# Patient Record
Sex: Male | Born: 1950
Health system: Southern US, Community
[De-identification: ages and names within clinical notes are randomized; demographics above are authoritative.]

## PROBLEM LIST (undated history)

## (undated) DIAGNOSIS — E119 Type 2 diabetes mellitus without complications: Secondary | ICD-10-CM

## (undated) DIAGNOSIS — Z8719 Personal history of other diseases of the digestive system: Secondary | ICD-10-CM

## (undated) DIAGNOSIS — I10 Essential (primary) hypertension: Secondary | ICD-10-CM

## (undated) DIAGNOSIS — T7840XA Allergy, unspecified, initial encounter: Secondary | ICD-10-CM

## (undated) DIAGNOSIS — Z8659 Personal history of other mental and behavioral disorders: Secondary | ICD-10-CM

## (undated) DIAGNOSIS — F32A Depression, unspecified: Secondary | ICD-10-CM

## (undated) DIAGNOSIS — N529 Male erectile dysfunction, unspecified: Secondary | ICD-10-CM

## (undated) DIAGNOSIS — K5792 Diverticulitis of intestine, part unspecified, without perforation or abscess without bleeding: Secondary | ICD-10-CM

## (undated) DIAGNOSIS — E785 Hyperlipidemia, unspecified: Secondary | ICD-10-CM

## (undated) DIAGNOSIS — F329 Major depressive disorder, single episode, unspecified: Secondary | ICD-10-CM

## (undated) DIAGNOSIS — H269 Unspecified cataract: Secondary | ICD-10-CM

## (undated) HISTORY — DX: Personal history of other mental and behavioral disorders: Z86.59

## (undated) HISTORY — DX: Allergy, unspecified, initial encounter: T78.40XA

## (undated) HISTORY — DX: Unspecified cataract: H26.9

## (undated) HISTORY — PX: NO PAST SURGERIES: SHX2092

## (undated) HISTORY — DX: Essential (primary) hypertension: I10

## (undated) HISTORY — PX: COLONOSCOPY: SHX174

## (undated) HISTORY — DX: Type 2 diabetes mellitus without complications: E11.9

## (undated) HISTORY — DX: Male erectile dysfunction, unspecified: N52.9

## (undated) HISTORY — DX: Personal history of other diseases of the digestive system: Z87.19

## (undated) HISTORY — DX: Diverticulitis of intestine, part unspecified, without perforation or abscess without bleeding: K57.92

## (undated) HISTORY — DX: Hyperlipidemia, unspecified: E78.5

## (undated) HISTORY — DX: Depression, unspecified: F32.A

## (undated) NOTE — *Deleted (*Deleted)
Chronic Care Management Pharmacy  Name: Eric Hill  MRN: [id number] DOB: Dec 05, 1950  Chief Complaint/ HPI  Eric Hill,  57 y.o., male presents for their Follow-Up CCM visit with the clinical pharmacist via telephone.  PCP : Doreene Nest, NP  Their chronic conditions include: HTN, type 2 DM, ED, vitamin D deficiency, MDD, obesity, urinary urgency   Patient concerns: reports sertraline dose is incorrect and he recently restarted cholesterol medication and vitamin D; feels awful, nausea, fatigue, not sleeping well  Update 01/13/2020 since last CCM visit:  O2 level fluctuating form 94-97% throughout the day. Still very low energy. Having stomach pain and has a history of stomach cancer in his family. He wants to see about this once he gets over his illness. Fever has resolved and feeling some better.   Office Visits: 11/08/19: PCP visit - start vitamin D 50,000 IU weekly; DM A1c 6.6 on glipizide, foot exam completed today, eye exam scheduled; cholesterol, ran out of statin, refill today; doing well on sertraline 75 mg daily, BP stable, continue lisinopril and metoprolol   Consult Visit: none in past 6 months  No Known Allergies   Medications: Outpatient Encounter Medications as of 02/13/2020  Medication Sig  . atorvastatin (LIPITOR) 20 MG tablet Take 1 tablet (20 mg total) by mouth daily. For cholesterol.  . blood glucose meter kit and supplies KIT Dispense based on patient and insurance preference. Use up to 2-3 times daily as directed. Dx is E11.9  . glipiZIDE (GLUCOTROL XL) 10 MG 24 hr tablet TAKE 1 TABLET BY MOUTH IN THE MORNING WITH FOOD FOR DIABETES  . lisinopril (ZESTRIL) 20 MG tablet Take 1 tablet (20 mg total) by mouth daily. for blood pressure  . metoprolol tartrate (LOPRESSOR) 25 MG tablet Take 1 tablet (25 mg total) by mouth 2 (two) times daily.  . psyllium (METAMUCIL) 58.6 % powder Take 1 packet by mouth as needed.  . sertraline (ZOLOFT) 50 MG tablet  Take 1.5 tablets (75 mg total) by mouth daily. For depression.  . tamsulosin (FLOMAX) 0.4 MG CAPS capsule TAKE 1 CAPSULE BY MOUTH ONCE DAILY WITH BREAKFAST FOR URINE FLOW  . Vitamin D, Ergocalciferol, (DRISDOL) 1.25 MG (50000 UNIT) CAPS capsule Take 1 capsule by mouth once weekly for vitamin D.   No facility-administered encounter medications on file as of 02/13/2020.   No Known Allergies  SDOH Screenings   Alcohol Screen:   . Last Alcohol Screening Score (AUDIT): Not on file  Depression (PHQ2-9):   . PHQ-2 Score: Not on file  Financial Resource Strain: Low Risk   . Difficulty of Paying Living Expenses: Not very hard  Food Insecurity:   . Worried About Programme researcher, broadcasting/film/video in the Last Year: Not on file  . Ran Out of Food in the Last Year: Not on file  Housing:   . Last Housing Risk Score: Not on file  Physical Activity:   . Days of Exercise per Week: Not on file  . Minutes of Exercise per Session: Not on file  Social Connections:   . Frequency of Communication with Friends and Family: Not on file  . Frequency of Social Gatherings with Friends and Family: Not on file  . Attends Religious Services: Not on file  . Active Member of Clubs or Organizations: Not on file  . Attends Banker Meetings: Not on file  . Marital Status: Not on file  Stress:   . Feeling of Stress : Not on file  Tobacco Use: Medium Risk  . Smoking Tobacco Use: Former Smoker  . Smokeless Tobacco Use: Never Used  Transportation Needs:   . Freight forwarder (Medical): Not on file  . Lack of Transportation (Non-Medical): Not on file     Current Diagnosis/Assessment:   Goals    . Pharmacy Care Plan     CARE PLAN ENTRY (see longitudinal plan of care for additional care plan information)  Current Barriers:  . Chronic Disease Management support, education, and care coordination needs related to Hypertension, Diabetes, and Urinary Urgency   Hypertension BP Readings from Last 3 Encounters:   11/08/19 132/82  10/25/19 (!) 144/77  05/05/19 131/68 .  Pharmacist Clinical Goal(s): Over the next 30 days, patient will work with PharmD and providers to achieve BP goal <140/90 mmHg . Current regimen:   Lisinopril 20 mg - 1 tablet daily  Metoprolol tartrate 25 mg - 1 tablet twice daily . Interventions: o Reviewed blood pressure log and discussed proper technique for checking blood pressure.  o Reviewed patient's medication list. . Patient self care activities - Over the next 30 days, patient will: o Recommend patient resume biking 3 times weekly now that temperatures have improved.  o Continue to check blood pressure twice daily, document, and provide at future appointment for review o Ensure daily salt intake < 2300 mg/day  Cardiovascular Risk Reduction Lab Results  Component Value Date/Time   LDLCALC 83 11/01/2018 08:51 AM   LDLDIRECT 100.0 11/03/2019 09:33 AM .  Pharmacist Clinical Goal(s): o Over the next 30 days, patient will work with PharmD and providers to improve medication side effects and reduce cardiovascular risk  . Current regimen:  o Atorvastatin 20 mg - 1 tablet daily . Interventions: o Recommend discontinuing atorvastatin due to intolerance (fatigue, nausea). Restore vitamin D levels and then consider starting pravastatin to reduce cardiovascular risk. . Patient self care activities - Over the next 30 days, patient will: o Discontinue atorvastatin  o Continue vitamin D once weekly   Diabetes Lab Results  Component Value Date/Time   HGBA1C 6.6 (H) 11/03/2019 09:33 AM   HGBA1C 6.2 11/01/2018 08:51 AM  Fasting home blood glucose readings: 160s before breakfast, 118-135 before lunch and supper . Pharmacist Clinical Goal(s): o Over the next 30 days, patient will work with PharmD and providers to maintain A1c goal <7% and ensure no hypoglycemia (BG < 70 mg/dL) . Current regimen:   Glipizide 10 mg ER - 1 tablet daily before first meal of the day  Interventions: o Recommend eating a high protein and fiber snack before bedtime to prevent overnight hypoglycemia, such as yogurt with no sugar added or cheese and nuts o Reviewed blood sugar readings. Discussed elevated blood sugars recently. Will follow-up in 2 weeks to determine if improving with diet/exercise.  . Patient self care activities - Over the next 30 days, patient will: o Check blood sugar daily first thing in the morning before eating or drinking, document, and provide at future appointment o Contact provider with any episodes of hypoglycemia o Review labels on snacks and choose options with 15-20 grams of carbs with high protein and fiber  Urinary Urgency . Pharmacist Clinical Goal(s) o Over the next 30 days, patient will work with PharmD and providers to improve urinary symptoms including weak stream . Current regimen:  o Tamsulosin 0.4 mg - 1 capsule daily with breakfast . Interventions: o Discussed patient reports 2 capsule daily dose did not improve symptoms and he didn't feel well so he  resumed daily.  . Patient self care activities - Over the next 30 days, patient will: o Continue tamsulosin 0.4 mg daily o Monitor for symptoms improvement over the next 2-3 weeks  Medication management . Pharmacist Clinical Goal(s): o Over the next 30 days, patient will work with PharmD and providers to achieve optimal medication adherence . Current pharmacy: [facility] . Interventions o Comprehensive medication review performed. o Utilize [facility] for medication synchronization, packaging and delivery . Patient self care activities - Over the next 30 days, patient will: o Coordinate with pharmacist for to have medications synchronized o Call [place] with any questions or concerns o Continue to take medications as prescribed  Please see past updates related to this goal by clicking on the "Past Updates" button in the selected goal         Hypertension   CMP  Latest Ref Rng & Units 11/03/2019 11/01/2018 05/03/2018  Glucose 70 - 99 mg/dL 161(W) 960(A) 540(J)  BUN 6 - 23 mg/dL 20 17 22   Creatinine 0.40 - 1.50 mg/dL 8.11 9.14 7.82  Sodium 135 - 145 mEq/L 136 138 136  Potassium 3.5 - 5.1 mEq/L 4.4 4.5 4.3  Chloride 96 - 112 mEq/L 100 103 102  CO2 19 - 32 mEq/L 28 28 27   Calcium 8.4 - 10.5 mg/dL 9.1 8.7 9.3  Total Protein 6.0 - 8.3 g/dL 6.9 6.8 -  Total Bilirubin 0.2 - 1.2 mg/dL 0.6 0.5 -  Alkaline Phos 39 - 117 U/L 77 77 -  AST 0 - 37 U/L 19 14 -  ALT 0 - 53 U/L 19 14 -   Office blood pressures are: BP Readings from Last 3 Encounters:  11/08/19 132/82  10/25/19 (!) 144/77  05/05/19 131/68   Patient has failed these meds in the past: none reported Patient checks BP at home: 2x per day, [employer] Patient home BP readings are ranging: 160/73, 68 (yesterday - he is currently out of lisinopril and metoprolol for the past 2 days); reports usually 145-160/70-80s in the morning before medications, 125/70-80 in the evenings  BP goal < 140/90 mmHg Patient is currently controlled on the following medications:   Lisinopril 20 mg - 1 tablet daily  Metoprolol tartrate 25 mg - 1 tablet BID (pt taking differently: 1 tablet in AM, 1/2 at night)   Update 01/13/2020 - Patient purchased a bike trainer this week to be able to exercise  and has been getting out of the house working in his shop. Blood pressure this week has been: ~127-135/70-80 pulse ~70.  .   Plan: Continue current medications;    Diabetes   Kidney Function Lab Results  Component Value Date/Time   CREATININE 0.88 11/03/2019 09:33 AM   CREATININE 0.81 11/01/2018 08:51 AM   GFR 86.48 11/03/2019 09:33 AM   K 4.4 11/03/2019 09:33 AM   K 4.5 11/01/2018 08:51 AM   Recent Relevant Labs: Lab Results  Component Value Date/Time   HGBA1C 6.6 (H) 11/03/2019 09:33 AM   HGBA1C 6.2 11/01/2018 08:51 AM   MICROALBUR 1.0 12/18/2015 03:23 PM   MICROALBUR 0.8 09/13/2014 09:01 AM   Checking  BG: 2x per Day  Reports BG before breakfast: 160s (not checking daily) Reports BG before lunch and before supper: 118-135 (checks daily) Hypoglycemia: reports feeling like he is going to pass out when his BG is < 90, yesterday BG was 85 at 5 PM and felt bad, ate a chocolate candy bar; denies doing any activity yesterday, slept a  lot, didn't eat as much as usual; prior to yesterday it had a been a year since BG < 90  A1c goal < 7% Patient has failed these meds in past: metformin (diarrhea) Patient is currently uncontrolled on the following medications:   Glipizide 10 mg ER - 1 tablet daily with food  We discussed:  Update 01/13/2020 - Blood sugar is still elevated at times at home. Blood sugar was 252 mg/dL. Patient does not feel that Glipizide ER 10 mg daily is enough for his blood sugar. He has been self-adjusting to take a second 1/2 tablet on days his blood sugar is high. His blood sugar is not running low lately.  Morning fasting blood sugar in the last  few weeks has been ~156, 164, 209, 160, 134, 166, 132, 134, 148, 226, 238, 246, 108, 143, 256 mg/dL. Before supper: 139 mg/dL. If healthier eating and exercise does not make a change in his blood sugars, he would like to consider increasing glipizide dose.   Diet - Patient indicates he is not watching carbohydrate his intake.   Exercise - Patient plans to start riding his bicycle 3 times each week.   Plan: Continue current medications.    Medication Management   OTCs: denies vitamins or OTC medications except Metamucil, not taking aspirin; reports taking Metamucil most days to prevent diarrhea, drinks plenty of water [facility] Adherence: Patient reports frustration with running out of medications, hassle of getting medications filled; he is out of multiple medications because insurance too soon to refill Cost: denies concerns  We discussed: Verbal consent obtained for [facility] enhanced pharmacy services  (medication synchronization, adherence packaging, delivery coordination). A medication sync plan was created to allow patient to get all medications delivered once every 30 to 90 days per patient preference. Patient understands they have freedom to choose pharmacy and clinical pharmacist will coordinate care between all prescribers and [facility].  Plan: Utilize [facility] for medication coordination, sync and delivery services.   CCM Follow Up: 2 weeks on blood sugar log to determine if medication needs to increased  Juliane Lack, PharmD, [facility] Clinical Pharmacist [facility] [phone] (office) [phone] (mobile)

---
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
## 1998-04-30 ENCOUNTER — Ambulatory Visit (HOSPITAL_COMMUNITY): Admission: RE | Admit: 1998-04-30 | Discharge: 1998-04-30 | Payer: Self-pay | Admitting: Gastroenterology

## 2014-08-10 ENCOUNTER — Encounter: Payer: Self-pay | Admitting: Primary Care

## 2014-08-10 ENCOUNTER — Ambulatory Visit (INDEPENDENT_AMBULATORY_CARE_PROVIDER_SITE_OTHER): Payer: BLUE CROSS/BLUE SHIELD | Admitting: Primary Care

## 2014-08-10 VITALS — BP 146/74 | HR 92 | Temp 98.1°F | Ht 65.75 in | Wt 264.0 lb

## 2014-08-10 DIAGNOSIS — R030 Elevated blood-pressure reading, without diagnosis of hypertension: Secondary | ICD-10-CM | POA: Diagnosis not present

## 2014-08-10 DIAGNOSIS — N529 Male erectile dysfunction, unspecified: Secondary | ICD-10-CM

## 2014-08-10 DIAGNOSIS — M25569 Pain in unspecified knee: Secondary | ICD-10-CM

## 2014-08-10 DIAGNOSIS — G8929 Other chronic pain: Secondary | ICD-10-CM

## 2014-08-10 DIAGNOSIS — Q828 Other specified congenital malformations of skin: Secondary | ICD-10-CM | POA: Diagnosis not present

## 2014-08-10 DIAGNOSIS — M25561 Pain in right knee: Secondary | ICD-10-CM

## 2014-08-10 DIAGNOSIS — I10 Essential (primary) hypertension: Secondary | ICD-10-CM | POA: Insufficient documentation

## 2014-08-10 DIAGNOSIS — G4720 Circadian rhythm sleep disorder, unspecified type: Secondary | ICD-10-CM

## 2014-08-10 DIAGNOSIS — G479 Sleep disorder, unspecified: Secondary | ICD-10-CM | POA: Diagnosis not present

## 2014-08-10 DIAGNOSIS — IMO0001 Reserved for inherently not codable concepts without codable children: Secondary | ICD-10-CM

## 2014-08-10 MED ORDER — SILDENAFIL CITRATE 25 MG PO TABS: ORAL_TABLET | ORAL | Status: DC

## 2014-08-10 NOTE — Assessment & Plan Note (Signed)
Difficulty staying asleep. Denies anxiety symptoms. Suspect this may be OSA, referral made for sleep study.

## 2014-08-10 NOTE — Assessment & Plan Note (Signed)
Likely due to obesity, encouraged weight loss through healthy diet and exercise. RX for Viagra. Explained side effects and provided directions for use including not to exceed 1 tablet per day. Denies chest pain, shortness of breath.

## 2014-08-10 NOTE — Assessment & Plan Note (Signed)
Since accident in 2008. Takes ibuprofen as needed (every 3 days or so). Reports he's had multiple xrays in the past.

## 2014-08-10 NOTE — Assessment & Plan Note (Signed)
Thoroughly discussed his current diet and lack of exercise. Recommendations made to limit carbohydrates such as bread, white rice, pastas, sugary foods and drinks; also incorporate fresh fruits and vegetables, lean meats, and increase water consumption.

## 2014-08-10 NOTE — Assessment & Plan Note (Signed)
Numerous present to bilateral base of neck. Reports they are present to bilateral groin as well, and would like them removed. Also reports new mole to left side of face to TMJ. Referral made to Dermatology.

## 2014-08-10 NOTE — Progress Notes (Signed)
Subjective:    Patient ID: Eric Hill, male    DOB: 09/21/1950, 64 y.o.   MRN: [id number]  HPI  Mr. Eric Hill is a 64 year old male who presents today to establish care and discuss the problems mentioned below. Will obtain old records.  1) Depression: Diagnosed years 20+ years ago while he was recovering from alcoholism. Denies SI/HI and denies depressive symptoms.   2) Erectile dysfunction: Experiencing difficulty maintaining an erection. He's not sure how long he's been experiencing this, but he's been in a relationship for the past 4 months and has noticed his difficulty since. He is consistently having difficulty maintaining his erection and will quickly lose his erection during each time he engages in intercourse. Denies chest pain, shortness of breath.  Body mass index is 42.94 kg/(m^2).  3) Elevated blood pressure: He's never been formally diagnosed. Today's reading is elevated. Denies headaches or changes in vision.  BP Readings from Last 3 Encounters:  08/10/14 146/74   4) Borderline diabetes: Was told by prior PCP 2 years ago. He's cut back on his candy bars and caffeine. His diet consists of vegetables and meat as he frequently eats at [employer]. He's also down down on his fast food consumption. If he cooks he will typically broil meat at home. Denies alcohol consumption. Drinks mostly water throughout a day, will drink a 20oz of soda per day. Does not exercise.  5) Sleep Disturbance: No trouble falling asleep, but will wake up in the middle of the night and be wide away throughout the night. Denies palpitations. Denies anxiety symptoms.   6) Knee pain: Injured in 2008 at work. Takes Advil every few days. Pain is worse when sitting and improved when walking. He reports he's had multiple xrays in the past. When he experiences his sharp pain it's at a 6/10.   Review of Systems  Constitutional: Negative for fatigue and unexpected weight change.  HENT: Negative for  rhinorrhea.   Respiratory: Negative for cough and shortness of breath.   Cardiovascular: Negative for chest pain.  Gastrointestinal: Negative for diarrhea and constipation.  Genitourinary: Negative for dysuria, difficulty urinating and penile pain.       Erectile difficulty  Musculoskeletal:       Right knee pain  Allergic/Immunologic:       Occasional seasonal allergies  Neurological: Negative for numbness and headaches.  Hematological: Negative for adenopathy.  Psychiatric/Behavioral:       Denies concerns for anxiety or depression       Past Medical History  Diagnosis Date  . Borderline diabetes   . History of depression   . Erectile dysfunction     History   Social History  . Marital Status: Single    Spouse Name: N/A  . Number of Children: N/A  . Years of Education: N/A   Occupational History  . Not on file.   Social History Main Topics  . Smoking status: Never Smoker   . Smokeless tobacco: Not on file  . Alcohol Use: No  . Drug Use: No  . Sexual Activity: Not on file   Other Topics Concern  . Not on file   Social History Narrative   Single   Works as a Mudlogger.   No children   Lives in [place]   Enjoys racing cars, Designer, fashion/clothing.       No past surgical history on file.  Family History  Problem Relation Age of Onset  . Neuropathy Mother   .  Cancer Sister     Back, brain  . Cancer Maternal Grandfather     Abdominal     No Known Allergies  No current outpatient prescriptions on file prior to visit.   No current facility-administered medications on file prior to visit.    BP 146/74 mmHg  Pulse 92  Temp(Src) 98.1 F (36.7 C) (Oral)  Ht 5' 5.75" (1.67 m)  Wt 264 lb (119.75 kg)  BMI 42.94 kg/m2  SpO2 95%    Objective:   Physical Exam  Constitutional: He is oriented to person, place, and time. He appears well-developed.  HENT:  Head: Normocephalic.  Right Ear: External ear normal.  Left Ear: External ear normal.    Nose: Nose normal.  Mouth/Throat: Oropharynx is clear and moist.  Eyes: EOM are normal. Pupils are equal, round, and reactive to light.  Neck: Neck supple.  Cardiovascular: Normal rate and regular rhythm.   Pulmonary/Chest: Effort normal and breath sounds normal.  Abdominal: Soft. Bowel sounds are normal. There is no tenderness.  Lymphadenopathy:    He has no cervical adenopathy.  Neurological: He is alert and oriented to person, place, and time.  Skin: Skin is warm and dry.  Psychiatric: He has a normal mood and affect.          Assessment & Plan:

## 2014-08-10 NOTE — Patient Instructions (Signed)
Please schedule a physical with me in the next month. You will also schedule a lab appointment prior to your physical. Come to your lab appointment fasting for at least 4 hours. You may try the Viagra tablets for erectile dysfunction. Take one tablet by mouth 30 minutes to 1 hour prior to intercourse. Do not exceed 1 tablet in one day. You will be contacted regarding your sleep study. It was a pleasure to meet you today! Please don't hesitate to call me with any questions. Welcome to [facility]!  Erectile Dysfunction Erectile dysfunction is the inability to get or sustain a good enough erection to have sexual intercourse. Erectile dysfunction may involve:  Inability to get an erection.  Lack of enough hardness to allow penetration.  Loss of the erection before sex is finished.  Premature ejaculation. CAUSES  Certain drugs, such as:  Pain relievers.  Antihistamines.  Antidepressants.  Blood pressure medicines.  Water pills (diuretics).  Ulcer medicines.  Muscle relaxants.  Illegal drugs.  Excessive drinking.  Psychological causes, such as:  Anxiety.  Depression.  Sadness.  Exhaustion.  Performance fear.  Stress.  Physical causes, such as:  Artery problems. This may include diabetes, smoking, liver disease, or atherosclerosis.  High blood pressure.  Hormonal problems, such as low testosterone.  Obesity.  Nerve problems. This may include back or pelvic injuries, diabetes mellitus, multiple sclerosis, or Parkinson disease. SYMPTOMS  Inability to get an erection.  Lack of enough hardness to allow penetration.  Loss of the erection before sex is finished.  Premature ejaculation.  Normal erections at some times, but with frequent unsatisfactory episodes.  Orgasms that are not satisfactory in sensation or frequency.  Low sexual satisfaction in either partner because of erection problems.  A curved penis occurring with erection. The curve may cause  pain or may be too curved to allow for intercourse.  Never having nighttime erections. DIAGNOSIS Your caregiver can often diagnose this condition by:  Performing a physical exam to find other diseases or specific problems with the penis.  Asking you detailed questions about the problem.  Performing blood tests to check for diabetes mellitus or to measure hormone levels.  Performing urine tests to find other underlying health conditions.  Performing an ultrasound exam to check for scarring.  Performing a test to check blood flow to the penis.  Doing a sleep study at home to measure nighttime erections. TREATMENT   You may be prescribed medicines by mouth.  You may be given medicine injections into the penis.  You may be prescribed a vacuum pump with a ring.  Penile implant surgery may be performed. You may receive:  An inflatable implant.  A semirigid implant.  Blood vessel surgery may be performed. HOME CARE INSTRUCTIONS  If you are prescribed oral medicine, you should take the medicine as prescribed. Do not increase the dosage without first discussing it with your physician.  If you are using self-injections, be careful to avoid any veins that are on the surface of the penis. Apply pressure to the injection site for 5 minutes.  If you are using a vacuum pump, make sure you have read the instructions before using it. Discuss any questions with your physician before taking the pump home. SEEK MEDICAL CARE IF:  You experience pain that is not responsive to the pain medicine you have been prescribed.  You experience nausea or vomiting. SEEK IMMEDIATE MEDICAL CARE IF:   When taking oral or injectable medications, you experience an erection that lasts longer  than 4 hours. If your physician is unavailable, go to the nearest emergency room for evaluation. An erection that lasts much longer than 4 hours can result in permanent damage to your penis.  You have pain that is  severe.  You develop redness, severe pain, or severe swelling of your penis.  You have redness spreading up into your groin or lower abdomen.  You are unable to pass your urine. Document Released: 04/11/2000 Document Revised: 12/15/2012 Document Reviewed: 09/16/2012 [facility] Patient Information 2015 [place], [place]. This information is not intended to replace advice given to you by your health care provider. Make sure you discuss any questions you have with your health care provider.

## 2014-08-10 NOTE — Assessment & Plan Note (Signed)
Elevated in clinic today. Will re-evaluate next visit, and if elevated will start medication therapy. Explained importance of limiting salt and eating healthy diet.

## 2014-08-10 NOTE — Progress Notes (Signed)
Pre visit review using our clinic review tool, if applicable. No additional management support is needed unless otherwise documented below in the visit note. 

## 2014-08-31 ENCOUNTER — Encounter: Payer: Self-pay | Admitting: Primary Care

## 2014-08-31 ENCOUNTER — Ambulatory Visit (INDEPENDENT_AMBULATORY_CARE_PROVIDER_SITE_OTHER): Payer: BLUE CROSS/BLUE SHIELD | Admitting: Primary Care

## 2014-08-31 VITALS — BP 146/72 | HR 83 | Temp 97.7°F | Ht 65.75 in | Wt 264.8 lb

## 2014-08-31 DIAGNOSIS — G479 Sleep disorder, unspecified: Secondary | ICD-10-CM | POA: Diagnosis not present

## 2014-08-31 DIAGNOSIS — G4700 Insomnia, unspecified: Secondary | ICD-10-CM | POA: Diagnosis not present

## 2014-08-31 DIAGNOSIS — G4720 Circadian rhythm sleep disorder, unspecified type: Secondary | ICD-10-CM

## 2014-08-31 MED ORDER — TRAZODONE HCL 50 MG PO TABS
25.0000 mg | ORAL_TABLET | Freq: Every evening | ORAL | Status: DC | PRN
Start: 2014-08-31 — End: 2015-08-29

## 2014-08-31 NOTE — Assessment & Plan Note (Signed)
Chronic for 4-5 years, worsened over the past 3 months. Tried Melatonin, will only sleep for one hour. Screened for anxiety and depression, does not meet criteria. Sleep study scheduled in 10/10/14 RX for Trazodone 50. Start with 25 mg at bedtime. May take 50 if needed. Will follow up in two weeks at physical

## 2014-08-31 NOTE — Assessment & Plan Note (Signed)
Discussed importance of weight loss for multiple reasons, including sleep and decreased libido. Education provided including handout.  Will follow up in 2 weeks at physical.

## 2014-08-31 NOTE — Patient Instructions (Signed)
Start Trazodone to help with sleep. Take 1/2 to 1 tablet by mouth at bedtime as needed for sleep.  It is important that you improve your diet. Please limit carbohydrates in the form of white bread, rice, pasta, potatoes, macaroni and cheese, cakes, cookies, sugary drinks, etc. Increase your consumption of fresh fruits and vegetables. Be sure to drink plenty of water daily. No more sweet tea.  Try to start exercising at least 5 days per week for 30-45 min.   Come May 16th for a lab appointment (come fasting) and then May 17th for your physical.  It was nice seeing you again.

## 2014-08-31 NOTE — Progress Notes (Signed)
Subjective:    Patient ID: Eric Hill, male    DOB: [birth year], 64 y.o.   MRN: [id number]  HPI  Mr. Cruces is a 64 year old male who presents today with a chief complaint of difficulty sleeping. He's had trouble sleeping since for the past 4-5 years, mostly in the winter. It's really worsened over the past 3 months since he started dating his girlfriend. He has a sleep study scheduled for June 14th. He's tried taking 3 different brands of Melatonin without help. He will feel drowsy and has no trouble falling asleep, but will wake back up after an hour or two. He has most difficulty staying asleep. He sleeps one hour at night. Does not drink caffeine. Bedtime routine includes resting for an hour.   Denies worrying, does not feel irritable during the day, does feel fatigued/tired during the day and is without energy, no muscle tension, does not feel depressed. PHQ-9 score of 0 in the office today.    Review of Systems  Constitutional: Positive for fatigue.  Respiratory: Negative for shortness of breath.   Cardiovascular: Negative for chest pain.  Neurological: Negative for headaches.  Psychiatric/Behavioral: Positive for sleep disturbance. Negative for decreased concentration and agitation. The patient is not nervous/anxious.        Past Medical History  Diagnosis Date  . Borderline diabetes   . History of depression   . Erectile dysfunction     History   Social History  . Marital Status: Single    Spouse Name: N/A  . Number of Children: N/A  . Years of Education: N/A   Occupational History  . Not on file.   Social History Main Topics  . Smoking status: Never Smoker   . Smokeless tobacco: Not on file  . Alcohol Use: No  . Drug Use: No  . Sexual Activity: Not on file   Other Topics Concern  . Not on file   Social History Narrative   Single   Works as a Mudlogger.   No children   Lives in [place]   Enjoys racing cars, Designer, fashion/clothing.        History reviewed. No pertinent past surgical history.  Family History  Problem Relation Age of Onset  . Neuropathy Mother   . Cancer Sister     Back, brain  . Cancer Maternal Grandfather     Abdominal     No Known Allergies  Current Outpatient Prescriptions on File Prior to Visit  Medication Sig Dispense Refill  . sildenafil (VIAGRA) 25 MG tablet Take one tablet 30 minutes to 1 hour prior to intercourse. Do not exceed one tablet per day. 10 tablet 0   No current facility-administered medications on file prior to visit.    BP 146/72 mmHg  Pulse 83  Temp(Src) 97.7 F (36.5 C) (Oral)  Ht 5' 5.75" (1.67 m)  Wt 264 lb 12.8 oz (120.112 kg)  BMI 43.07 kg/m2  SpO2 94%    Objective:   Physical Exam  Constitutional: He is oriented to person, place, and time.  Appears tired and is frustrated with his inability to sleep.  Eyes: EOM are normal. Pupils are equal, round, and reactive to light.  Cardiovascular: Normal rate and regular rhythm.   Pulmonary/Chest: Effort normal and breath sounds normal.  Neurological: He is alert and oriented to person, place, and time. No cranial nerve deficit.  Skin: Skin is warm and dry.  Psychiatric: He has a normal mood and affect.  Assessment & Plan:

## 2014-08-31 NOTE — Assessment & Plan Note (Deleted)
Chronic for 4-5 years, worsened over the past 3 months. Tried Melatonin, will only sleep for one hour. Screened for anxiety and depression, does not meet criteria. Sleep study scheduled in 10/10/14 RX for Trazodone 50. Start with 25 mg at bedtime. May take 50 if needed. Will follow up in two weeks at physical

## 2014-09-04 ENCOUNTER — Ambulatory Visit: Payer: Self-pay | Admitting: Primary Care

## 2014-09-06 ENCOUNTER — Other Ambulatory Visit: Payer: Self-pay | Admitting: Primary Care

## 2014-09-06 DIAGNOSIS — E669 Obesity, unspecified: Secondary | ICD-10-CM

## 2014-09-06 DIAGNOSIS — Z0000 Encounter for general adult medical examination without abnormal findings: Secondary | ICD-10-CM

## 2014-09-06 DIAGNOSIS — Z8639 Personal history of other endocrine, nutritional and metabolic disease: Secondary | ICD-10-CM

## 2014-09-11 ENCOUNTER — Other Ambulatory Visit (INDEPENDENT_AMBULATORY_CARE_PROVIDER_SITE_OTHER): Payer: BLUE CROSS/BLUE SHIELD

## 2014-09-11 DIAGNOSIS — E669 Obesity, unspecified: Secondary | ICD-10-CM | POA: Diagnosis not present

## 2014-09-11 DIAGNOSIS — Z0000 Encounter for general adult medical examination without abnormal findings: Secondary | ICD-10-CM | POA: Diagnosis not present

## 2014-09-11 DIAGNOSIS — Z8639 Personal history of other endocrine, nutritional and metabolic disease: Secondary | ICD-10-CM

## 2014-09-11 LAB — CBC WITH DIFFERENTIAL/PLATELET
BASOS PCT: 0.5 % (ref 0.0–3.0)
Basophils Absolute: 0 10*3/uL (ref 0.0–0.1)
Eosinophils Absolute: 0.2 10*3/uL (ref 0.0–0.7)
Eosinophils Relative: 2.9 % (ref 0.0–5.0)
HEMATOCRIT: 45.4 % (ref 39.0–52.0)
Hemoglobin: 15.5 g/dL (ref 13.0–17.0)
LYMPHS ABS: 1.6 10*3/uL (ref 0.7–4.0)
Lymphocytes Relative: 22.4 % (ref 12.0–46.0)
MCHC: 34.1 g/dL (ref 30.0–36.0)
MCV: 88 fl (ref 78.0–100.0)
MONO ABS: 0.6 10*3/uL (ref 0.1–1.0)
Monocytes Relative: 8.7 % (ref 3.0–12.0)
Neutro Abs: 4.7 10*3/uL (ref 1.4–7.7)
Neutrophils Relative %: 65.5 % (ref 43.0–77.0)
PLATELETS: 262 10*3/uL (ref 150.0–400.0)
RBC: 5.16 Mil/uL (ref 4.22–5.81)
RDW: 13.5 % (ref 11.5–15.5)
WBC: 7.2 10*3/uL (ref 4.0–10.5)

## 2014-09-11 LAB — VITAMIN D 25 HYDROXY (VIT D DEFICIENCY, FRACTURES): VITD: 10.38 ng/mL — ABNORMAL LOW (ref 30.00–100.00)

## 2014-09-11 LAB — COMPREHENSIVE METABOLIC PANEL
ALBUMIN: 3.7 g/dL (ref 3.5–5.2)
ALK PHOS: 63 U/L (ref 39–117)
ALT: 18 U/L (ref 0–53)
AST: 19 U/L (ref 0–37)
BUN: 21 mg/dL (ref 6–23)
CALCIUM: 9.2 mg/dL (ref 8.4–10.5)
CHLORIDE: 101 meq/L (ref 96–112)
CO2: 31 meq/L (ref 19–32)
Creatinine, Ser: 0.77 mg/dL (ref 0.40–1.50)
GFR: 108.99 mL/min (ref >60.00)
GLUCOSE: 148 mg/dL — AB (ref 70–99)
POTASSIUM: 4.3 meq/L (ref 3.5–5.1)
SODIUM: 136 meq/L (ref 135–145)
TOTAL PROTEIN: 7.1 g/dL (ref 6.0–8.3)
Total Bilirubin: 0.9 mg/dL (ref 0.2–1.2)

## 2014-09-11 LAB — LIPID PANEL
Cholesterol: 150 mg/dL (ref 0–200)
HDL: 46.3 mg/dL (ref >39.00)
LDL Cholesterol: 86 mg/dL (ref 0–99)
NonHDL: 103.7
Total CHOL/HDL Ratio: 3
Triglycerides: 89 mg/dL (ref 0.0–149.0)
VLDL: 17.8 mg/dL (ref 0.0–40.0)

## 2014-09-11 LAB — HEMOGLOBIN A1C: Hgb A1c MFr Bld: 7.3 % — ABNORMAL HIGH (ref 4.6–6.5)

## 2014-09-11 LAB — TSH: TSH: 2.35 u[IU]/mL (ref 0.35–4.50)

## 2014-09-12 ENCOUNTER — Ambulatory Visit (INDEPENDENT_AMBULATORY_CARE_PROVIDER_SITE_OTHER): Payer: BLUE CROSS/BLUE SHIELD | Admitting: Primary Care

## 2014-09-12 ENCOUNTER — Telehealth: Payer: Self-pay | Admitting: Primary Care

## 2014-09-12 ENCOUNTER — Encounter: Payer: Self-pay | Admitting: Primary Care

## 2014-09-12 VITALS — BP 148/70 | HR 90 | Temp 98.3°F | Ht 65.75 in | Wt 265.4 lb

## 2014-09-12 DIAGNOSIS — Z1211 Encounter for screening for malignant neoplasm of colon: Secondary | ICD-10-CM

## 2014-09-12 DIAGNOSIS — E559 Vitamin D deficiency, unspecified: Secondary | ICD-10-CM | POA: Diagnosis not present

## 2014-09-12 DIAGNOSIS — Z0000 Encounter for general adult medical examination without abnormal findings: Secondary | ICD-10-CM | POA: Diagnosis not present

## 2014-09-12 DIAGNOSIS — E119 Type 2 diabetes mellitus without complications: Secondary | ICD-10-CM | POA: Diagnosis not present

## 2014-09-12 DIAGNOSIS — Z0001 Encounter for general adult medical examination with abnormal findings: Secondary | ICD-10-CM | POA: Insufficient documentation

## 2014-09-12 DIAGNOSIS — G479 Sleep disorder, unspecified: Secondary | ICD-10-CM

## 2014-09-12 DIAGNOSIS — E1165 Type 2 diabetes mellitus with hyperglycemia: Secondary | ICD-10-CM | POA: Insufficient documentation

## 2014-09-12 DIAGNOSIS — G4720 Circadian rhythm sleep disorder, unspecified type: Secondary | ICD-10-CM

## 2014-09-12 MED ORDER — METFORMIN HCL 500 MG PO TABS
500.0000 mg | ORAL_TABLET | Freq: Two times a day (BID) | ORAL | Status: DC
Start: 2014-09-12 — End: 2015-08-30

## 2014-09-12 MED ORDER — VITAMIN D (ERGOCALCIFEROL) 1.25 MG (50000 UNIT) PO CAPS: ORAL_CAPSULE | ORAL | Status: DC

## 2014-09-12 NOTE — Assessment & Plan Note (Signed)
Hemoglobin A1C of 7.3. Discussed the importance of healthy diet and exercise. Started Metformin 500 mg BID. Start with 500 mg daily for 3 days then advance to BID. Urine micro albumin today. Foot exam today. Follow up in 3 months.

## 2014-09-12 NOTE — Assessment & Plan Note (Signed)
Tetanus up to date. Declines pneumovax. Never has received influenza and is not interested next season. Discussed diabetic diet and the importance of exercise.  Referral made for colonoscopy. Eye exam up to date.

## 2014-09-12 NOTE — Assessment & Plan Note (Signed)
Vitamin D of 10. Start Vitamin D 50,000 units weekly for 12 weeks. Will recheck in 12 weeks.

## 2014-09-12 NOTE — Patient Instructions (Addendum)
Start Metformin tablets for diabetes. Start taking 1 tablet by mouth daily for 3 days, then advance to take 1 tablet by mouth twice daily. Start Vitamin D capsules. Take 1 capsule by mouth once weekly for a total of 12 weeks. You will be contacted regarding your referral to GI for your colonoscopy.  Please let us know if you have not heard back within one week.  It is important that you improve your diet. Please limit carbohydrates in the form of white bread, rice, pasta, cakes, cookies, sugary drinks, etc. Increase your consumption of fresh fruits and vegetables. Be sure to drink plenty of water daily. Follow up in 3 months for re-check of your diabetes.  Type 2 Diabetes Mellitus Type 2 diabetes mellitus, often simply referred to as type 2 diabetes, is a long-lasting (chronic) disease. In type 2 diabetes, the pancreas does not make enough insulin (a hormone), the cells are less responsive to the insulin that is made (insulin resistance), or both. Normally, insulin moves sugars from food into the tissue cells. The tissue cells use the sugars for energy. The lack of insulin or the lack of normal response to insulin causes excess sugars to build up in the blood instead of going into the tissue cells. As a result, high blood sugar (hyperglycemia) develops. The effect of high sugar (glucose) levels can cause many complications. Type 2 diabetes was also previously called adult-onset diabetes, but it can occur at any age.  RISK FACTORS  A person is predisposed to developing type 2 diabetes if someone in the family has the disease and also has one or more of the following primary risk factors:  Overweight.  An inactive lifestyle.  A history of consistently eating high-calorie foods. Maintaining a normal weight and regular physical activity can reduce the chance of developing type 2 diabetes. SYMPTOMS  A person with type 2 diabetes may not show symptoms initially. The symptoms of type 2 diabetes appear  slowly. The symptoms include:  Increased thirst (polydipsia).  Increased urination (polyuria).  Increased urination during the night (nocturia).  Weight loss. This weight loss may be rapid.  Frequent, recurring infections.  Tiredness (fatigue).  Weakness.  Vision changes, such as blurred vision.  Fruity smell to your breath.  Abdominal pain.  Nausea or vomiting.  Cuts or bruises which are slow to heal.  Tingling or numbness in the hands or feet. DIAGNOSIS Type 2 diabetes is frequently not diagnosed until complications of diabetes are present. Type 2 diabetes is diagnosed when symptoms or complications are present and when blood glucose levels are increased. Your blood glucose level may be checked by one or more of the following blood tests:  A fasting blood glucose test. You will not be allowed to eat for at least 8 hours before a blood sample is taken.  A random blood glucose test. Your blood glucose is checked at any time of the day regardless of when you ate.  A hemoglobin A1c blood glucose test. A hemoglobin A1c test provides information about blood glucose control over the previous 3 months.  An oral glucose tolerance test (OGTT). Your blood glucose is measured after you have not eaten (fasted) for 2 hours and then after you drink a glucose-containing beverage. TREATMENT   You may need to take insulin or diabetes medicine daily to keep blood glucose levels in the desired range.  If you use insulin, you may need to adjust the dosage depending on the carbohydrates that you eat with each meal or  snack. The treatment goal is to maintain the before meal blood sugar (preprandial glucose) level at 70-130 mg/dL. HOME CARE INSTRUCTIONS   Have your hemoglobin A1c level checked twice a year.  Perform daily blood glucose monitoring as directed by your health care provider.  Monitor urine ketones when you are ill and as directed by your health care provider.  Take your  diabetes medicine or insulin as directed by your health care provider to maintain your blood glucose levels in the desired range.  Never run out of diabetes medicine or insulin. It is needed every day.  If you are using insulin, you may need to adjust the amount of insulin given based on your intake of carbohydrates. Carbohydrates can raise blood glucose levels but need to be included in your diet. Carbohydrates provide vitamins, minerals, and fiber which are an essential part of a healthy diet. Carbohydrates are found in fruits, vegetables, whole grains, dairy products, legumes, and foods containing added sugars.  Eat healthy foods. You should make an appointment to see a registered dietitian to help you create an eating plan that is right for you.  Lose weight if you are overweight.  Carry a medical alert card or wear your medical alert jewelry.  Carry a 15-gram carbohydrate snack with you at all times to treat low blood glucose (hypoglycemia). Some examples of 15-gram carbohydrate snacks include:  Glucose tablets, 3 or 4.  Glucose gel, 15-gram tube.  Raisins, 2 tablespoons (24 grams).  Jelly beans, 6.  Animal crackers, 8.  Regular pop, 4 ounces (120 mL).  Gummy treats, 9.  Recognize hypoglycemia. Hypoglycemia occurs with blood glucose levels of 70 mg/dL and below. The risk for hypoglycemia increases when fasting or skipping meals, during or after intense exercise, and during sleep. Hypoglycemia symptoms can include:  Tremors or shakes.  Decreased ability to concentrate.  Sweating.  Increased heart rate.  Headache.  Dry mouth.  Hunger.  Irritability.  Anxiety.  Restless sleep.  Altered speech or coordination.  Confusion.  Treat hypoglycemia promptly. If you are alert and able to safely swallow, follow the 15:15 rule:  Take 15-20 grams of rapid-acting glucose or carbohydrate. Rapid-acting options include glucose gel, glucose tablets, or 4 ounces (120 mL) of  fruit juice, regular soda, or low-fat milk.  Check your blood glucose level 15 minutes after taking the glucose.  Take 15-20 grams more of glucose if the repeat blood glucose level is still 70 mg/dL or below.  Eat a meal or snack within 1 hour once blood glucose levels return to normal.  Be alert to feeling very thirsty and urinating more frequently than usual, which are early signs of hyperglycemia. An early awareness of hyperglycemia allows for prompt treatment. Treat hyperglycemia as directed by your health care provider.  Engage in at least 150 minutes of moderate-intensity physical activity a week, spread over at least 3 days of the week or as directed by your health care provider. In addition, you should engage in resistance exercise at least 2 times a week or as directed by your health care provider. Try to spend no more than 90 minutes at one time inactive.  Adjust your medicine and food intake as needed if you start a new exercise or sport.  Follow your sick-day plan anytime you are unable to eat or drink as usual.  Do not use any tobacco products including cigarettes, chewing tobacco, or electronic cigarettes. If you need help quitting, ask your health care provider.  Limit alcohol intake to  no more than 1 drink per day for nonpregnant women and 2 drinks per day for men. You should drink alcohol only when you are also eating food. Talk with your health care provider whether alcohol is safe for you. Tell your health care provider if you drink alcohol several times a week.  Keep all follow-up visits as directed by your health care provider. This is important.  Schedule an eye exam soon after the diagnosis of type 2 diabetes and then annually.  Perform daily skin and foot care. Examine your skin and feet daily for cuts, bruises, redness, nail problems, bleeding, blisters, or sores. A foot exam by a health care provider should be done annually.  Brush your teeth and gums at least  twice a day and floss at least once a day. Follow up with your dentist regularly.  Share your diabetes management plan with your workplace or school.  Stay up-to-date with immunizations. It is recommended that people with diabetes who are over 80 years old get the pneumonia vaccine. In some cases, two separate shots may be given. Ask your health care provider if your pneumonia vaccination is up-to-date.  Learn to manage stress.  Obtain ongoing diabetes education and support as needed.  Participate in or seek rehabilitation as needed to maintain or improve independence and quality of life. Request a physical or occupational therapy referral if you are having foot or hand numbness, or difficulties with grooming, dressing, eating, or physical activity. SEEK MEDICAL CARE IF:   You are unable to eat food or drink fluids for more than 6 hours.  You have nausea and vomiting for more than 6 hours.  Your blood glucose level is over 240 mg/dL.  There is a change in mental status.  You develop an additional serious illness.  You have diarrhea for more than 6 hours.  You have been sick or have had a fever for a couple of days and are not getting better.  You have pain during any physical activity.  SEEK IMMEDIATE MEDICAL CARE IF:  You have difficulty breathing.  You have moderate to large ketone levels. MAKE SURE YOU:  Understand these instructions.  Will watch your condition.  Will get help right away if you are not doing well or get worse. Document Released: 04/14/2005 Document Revised: 08/29/2013 Document Reviewed: 11/11/2011 [facility] Patient Information 2015 [place], [place]. This information is not intended to replace advice given to you by your health care provider. Make sure you discuss any questions you have with your health care provider.

## 2014-09-12 NOTE — Telephone Encounter (Signed)
Left voice mail for a call back

## 2014-09-12 NOTE — Telephone Encounter (Signed)
Patient called back. Schedule lab apt on Wed 09/13/14

## 2014-09-12 NOTE — Progress Notes (Signed)
Pre visit review using our clinic review tool, if applicable. No additional management support is needed unless otherwise documented below in the visit note. 

## 2014-09-12 NOTE — Telephone Encounter (Signed)
Will you please call Mr. Larch and notify him that I need a urine specimen for one additional diabetes test (urine microalbumin)? I ordered it while he was here today, but completely forgot to notify him.  Thanks.

## 2014-09-12 NOTE — Progress Notes (Signed)
Subjective:    Patient ID: Eric Hill, male    DOB: [birth year], 64 y.o.   MRN: [id number]  HPI  Eric Hill is a 64 year old male who presents today for complete physical.  Immunizations: -Tetanus: Within the past 10 years from [place] -Influenza: Did not receive last season and has never had one. -Pneumonia: Never received and is not interested.  Diet: He's started improving his diet over the past several weeks. He's no longer eating out as much and has switched to salads, subway, more vegetables. He's drinking more water, occasional sierra mist and sweet tea, but has overall cut back on soda and tea consumption.  Wt Readings from Last 3 Encounters:  09/12/14 265 lb 6.4 oz (120.385 kg)  08/31/14 264 lb 12.8 oz (120.112 kg)  08/10/14 264 lb (119.75 kg)    BP Readings from Last 3 Encounters:  09/12/14 148/70  08/31/14 146/72  08/10/14 146/74     Exercise: Started to walk outside for 2-3 miles at a time now that the weather's better. Eye exam: Dr. Shirley Muscat twice a year for glaucoma screening. Due in November this year. Dental exam: Previous dentist retired recently, cannot afford to go at this time. Colonoscopy: Completed at age 45.   1) Insomnia: Overall he's had a improvement in his sleep. He will take the Trazodone a few times weekly. He's been going to bed earlier for the most part, but lately has been staying up later which causes him to be tired during the day. Sleep study to be completed in June.  Review of Systems  Constitutional: Positive for fatigue. Negative for unexpected weight change.  HENT: Negative for rhinorrhea.   Respiratory: Negative for cough and shortness of breath.   Cardiovascular: Negative for chest pain.  Gastrointestinal: Negative for diarrhea, constipation and blood in stool.       Metamucil daily  Genitourinary: Negative for dysuria and frequency.       Occasional difficulty urinating, infrequent.  Musculoskeletal:       Chronic  right knee pain  Skin:       Multiple skin tags to neck.  Allergic/Immunologic: Positive for environmental allergies.  Neurological: Negative for dizziness and headaches.  Psychiatric/Behavioral: Positive for sleep disturbance. The patient is nervous/anxious.        Past Medical History  Diagnosis Date  . Borderline diabetes   . History of depression   . Erectile dysfunction     History   Social History  . Marital Status: Single    Spouse Name: N/A  . Number of Children: N/A  . Years of Education: N/A   Occupational History  . Not on file.   Social History Main Topics  . Smoking status: Never Smoker   . Smokeless tobacco: Not on file  . Alcohol Use: No  . Drug Use: No  . Sexual Activity: Not on file   Other Topics Concern  . Not on file   Social History Narrative   Single   Works as a Mudlogger.   No children   Lives in [place]   Enjoys racing cars, Designer, fashion/clothing.       No past surgical history on file.  Family History  Problem Relation Age of Onset  . Neuropathy Mother   . Cancer Sister     Back, brain  . Cancer Maternal Grandfather     Abdominal     No Known Allergies  Current Outpatient Prescriptions on File Prior to Visit  Medication  Sig Dispense Refill  . sildenafil (VIAGRA) 25 MG tablet Take one tablet 30 minutes to 1 hour prior to intercourse. Do not exceed one tablet per day. 10 tablet 0  . traZODone (DESYREL) 50 MG tablet Take 0.5-1 tablets (25-50 mg total) by mouth at bedtime as needed for sleep. 30 tablet 0   No current facility-administered medications on file prior to visit.    BP 148/70 mmHg  Pulse 90  Temp(Src) 98.3 F (36.8 C) (Oral)  Ht 5' 5.75" (1.67 m)  Wt 265 lb 6.4 oz (120.385 kg)  BMI 43.17 kg/m2  SpO2 95%    Objective:   Physical Exam  Constitutional: He is oriented to person, place, and time. He appears well-nourished.  HENT:  Right Ear: Tympanic membrane and ear canal normal.  Left Ear: Tympanic  membrane and ear canal normal.  Nose: Nose normal.  Mouth/Throat: Oropharynx is clear and moist.  Eyes: Conjunctivae and EOM are normal. Pupils are equal, round, and reactive to light.  Neck: Neck supple. No thyromegaly present.  Cardiovascular: Normal rate and regular rhythm.   Pulmonary/Chest: Effort normal and breath sounds normal.  Abdominal: Soft. Bowel sounds are normal. He exhibits no mass. There is no tenderness.  Lymphadenopathy:    He has no cervical adenopathy.  Neurological: He is alert and oriented to person, place, and time. He has normal reflexes.  Skin: Skin is warm and dry.  Psychiatric: He has a normal mood and affect.          Assessment & Plan:  Follow up in 3 months for repeat A1C.

## 2014-09-12 NOTE — Assessment & Plan Note (Signed)
Improved with Trazodone. Suspect further improvement with control of sugars. Will continue to monitor.

## 2014-09-13 ENCOUNTER — Other Ambulatory Visit: Payer: BLUE CROSS/BLUE SHIELD

## 2014-09-13 LAB — MICROALBUMIN / CREATININE URINE RATIO
CREATININE, U: 97.2 mg/dL
MICROALB UR: 0.8 mg/dL (ref 0.0–1.9)
MICROALB/CREAT RATIO: 0.8 mg/g (ref 0.0–30.0)

## 2014-09-13 NOTE — Addendum Note (Signed)
Addended by: Daralene Milch C on: 09/13/2014 09:01 AM   Modules accepted: Orders, SmartSet

## 2014-09-20 ENCOUNTER — Telehealth: Payer: Self-pay

## 2014-09-20 ENCOUNTER — Telehealth: Payer: Self-pay | Admitting: Pulmonary Disease

## 2014-09-20 NOTE — Telephone Encounter (Signed)
Spoke with pt and informed him SN is not accepting primary care patients. Did give pt phone number to primary care downstairs to set up appt.. Nothing further needed.

## 2014-09-20 NOTE — Telephone Encounter (Signed)
Spoke with patient regarding concerns. 

## 2014-09-20 NOTE — Telephone Encounter (Signed)
Patient called back and wants to make sure that he talks to someone today.

## 2014-09-20 NOTE — Telephone Encounter (Signed)
Pt was last seen 09/12/14;pt still having problem with insomnia. Pt taking the trazodone 50 mg and does sleep for 2 hours and then wakes up for short period and then goes back to sleep and wake up and go back to sleep cycle last all night. Pt said really only sleeping 2 hours continuous. Pt has sleep apnea test in 20 days but pt cannot continue not getting more than 2 hours of sleep at night. Pt request cb. [facility] [place].

## 2014-10-10 ENCOUNTER — Institutional Professional Consult (permissible substitution): Payer: BLUE CROSS/BLUE SHIELD | Admitting: Pulmonary Disease

## 2014-12-13 ENCOUNTER — Ambulatory Visit: Payer: BLUE CROSS/BLUE SHIELD | Admitting: Primary Care

## 2015-08-22 ENCOUNTER — Telehealth: Payer: Self-pay | Admitting: Internal Medicine

## 2015-08-22 NOTE — Telephone Encounter (Signed)
Okay with me as well

## 2015-08-22 NOTE — Telephone Encounter (Signed)
Ok with me 

## 2015-08-22 NOTE — Telephone Encounter (Signed)
Left vm for patient to call back to schedule transfer appt for July

## 2015-08-22 NOTE — Telephone Encounter (Signed)
Patient is requesting to transfer from [facility] to [facility].  Please advise.

## 2015-08-29 ENCOUNTER — Ambulatory Visit (INDEPENDENT_AMBULATORY_CARE_PROVIDER_SITE_OTHER): Payer: Managed Care, Other (non HMO) | Admitting: Primary Care

## 2015-08-29 ENCOUNTER — Encounter: Payer: Self-pay | Admitting: Primary Care

## 2015-08-29 VITALS — BP 144/94 | HR 100 | Temp 97.9°F | Wt 271.4 lb

## 2015-08-29 DIAGNOSIS — I10 Essential (primary) hypertension: Secondary | ICD-10-CM

## 2015-08-29 DIAGNOSIS — Z1211 Encounter for screening for malignant neoplasm of colon: Secondary | ICD-10-CM

## 2015-08-29 DIAGNOSIS — E119 Type 2 diabetes mellitus without complications: Secondary | ICD-10-CM | POA: Diagnosis not present

## 2015-08-29 MED ORDER — LISINOPRIL 10 MG PO TABS: 10.0000 mg | ORAL_TABLET | Freq: Every day | ORAL | Status: DC

## 2015-08-29 MED ORDER — BLOOD GLUCOSE MONITOR KIT: PACK | Status: AC

## 2015-08-29 NOTE — Assessment & Plan Note (Signed)
Elevated again today. Lisinopril 10 mg added to regimen today. Follow up in 2 weeks for BMP and re-evaluation.

## 2015-08-29 NOTE — Progress Notes (Signed)
Subjective:    Patient ID: Eric Hill, male    DOB: [birth year], 65 y.o.   MRN: [id number]  HPI  Mr. Guider is a 65 year old male who presents today with a chief complaint of abdominal pain. His abdominal pain is chronic and has been present for 2-3 years. He's had trauma to his left lower chest wall years ago.   He describes his pain as a fullness and cramping that is located to the LUQ. He will wake up from sleep with this discomfort (2 times weekly) sometimes, after he's eaten greasy food. He will take Mylanta with improvement. Denies chest pain, diaphoresis, nausea, radiation of pain, diarrhea. He does take Metamucil daily for constipation and has a history of diverticulitis. He is not managed on H2 Blocker or PPI. He is due for repeat colonoscopy.  2) Type 2 Diabetes: Has not followed up for repeat A1C in 1 year despite recommendations. He's been taking 1 tablet of his Metformin once daily for the past 3-4 months as he experienced diarrhea on 2 tablets daily. He did not notify our office of this discomfort. He denies numbness or tingling to his feet. He does not have a glucometer but has checked his blood sugars a few times with a friends meter and has seen numbers of 140-250's on average fasting. Last A1C of 7.3.   3) Elevated Blood Pressure Reading: Elevated today in the clinic at 144/94. Also elevated on numerous visits in the past. He endorses elevated readings for years but has never been diagnosed or treated. He is overweight and endorses a poor diet. Denies chest pain, dizziness, headaches.   Review of Systems  Respiratory: Negative for shortness of breath.   Cardiovascular: Negative for chest pain.  Gastrointestinal: Positive for abdominal pain. Negative for nausea, vomiting, diarrhea and constipation.  Neurological: Negative for dizziness, numbness and headaches.       Past Medical History  Diagnosis Date  . Borderline diabetes   . History of depression   .  Erectile dysfunction      Social History   Social History  . Marital Status: Single    Spouse Name: N/A  . Number of Children: N/A  . Years of Education: N/A   Occupational History  . Not on file.   Social History Main Topics  . Smoking status: Former Research scientist (life sciences)  . Smokeless tobacco: Not on file  . Alcohol Use: 0.0 oz/week    0 Standard drinks or equivalent per week     Comment: rarely  . Drug Use: No  . Sexual Activity: Not on file   Other Topics Concern  . Not on file   Social History Narrative   Single   Works as a Mudlogger.   No children   Lives in [place]   Enjoys racing cars, Designer, fashion/clothing.       No past surgical history on file.  Family History  Problem Relation Age of Onset  . Neuropathy Mother   . Cancer Sister     Back, brain  . Cancer Maternal Grandfather     Abdominal     No Known Allergies  Current Outpatient Prescriptions on File Prior to Visit  Medication Sig Dispense Refill  . metFORMIN (GLUCOPHAGE) 500 MG tablet Take 1 tablet (500 mg total) by mouth 2 (two) times daily with a meal. (Patient taking differently: Take 500 mg by mouth daily with breakfast. ) 60 tablet 3  . sildenafil (VIAGRA) 25 MG tablet Take one  tablet 30 minutes to 1 hour prior to intercourse. Do not exceed one tablet per day. (Patient not taking: Reported on 08/29/2015) 10 tablet 0   No current facility-administered medications on file prior to visit.    BP 144/94 mmHg  Pulse 100  Temp(Src) 97.9 F (36.6 C) (Oral)  Wt 271 lb 6.4 oz (123.106 kg)  SpO2 97%    Objective:   Physical Exam  Constitutional: He appears well-nourished.  Cardiovascular: Normal rate and regular rhythm.   Pulmonary/Chest: Effort normal and breath sounds normal.  Abdominal: Soft. Bowel sounds are normal. There is no tenderness.  Skin: Skin is warm and dry.          Assessment & Plan:  Abdominal Pain:  Chronic for years. History of diverticulosis found via prior  colonoscopy. Due for repeat colonoscopy which was ordered. No alarm signs, exam unremarkable. Suspect PUD or GERD and will treat with H2 Blocker for now. He is to update me if no improvement. CMP pending.

## 2015-08-29 NOTE — Progress Notes (Signed)
Pre visit review using our clinic review tool, if applicable. No additional management support is needed unless otherwise documented below in the visit note. 

## 2015-08-29 NOTE — Assessment & Plan Note (Signed)
Has not followed up for repeat A1C as recommended, is not taking metformin as prescribed, and did not notify us of side effects of diarrhea. Poor diet and does not exercise.  BP elevated again today, ACE initiated.  Urine microalbumin on file from last year and unremarkable. Repeat A1C today. He plans on transferring to the [place] office as it is closer to home. Discussed importance of follow up with future PCP to gain control of sugars. RX provided today for meter, test strips, and lancets. A1C pending. If above goal will consider increasing Metformin and changing to XR, or add Glipizide.

## 2015-08-29 NOTE — Patient Instructions (Addendum)
Complete lab work prior to leaving today. I will notify you of your results once received.   Medication such as Pepcid or Zantac may help with your abdominal discomfort. I recommend trying this medication for at least 4 weeks. This may be purchased over the counter.  You will be contacted regarding your referral to GI for the colonoscopy.  Please let us know if you have not heard back within one week.   Start Lisinopril 10 mg tablets for high blood pressure. Take 1 tablet by mouth every morning.  Check your blood pressure daily, around the same time of day, for the next 2 weeks.   Ensure that you have rested for 30 minutes prior to checking your blood pressure. Record your readings and bring them to your next visit.  Follow up in 2 weeks for re-evaluation of blood pressure.  It was a pleasure to see you today!  Food Choices for Gastroesophageal Reflux Disease, Adult When you have gastroesophageal reflux disease (GERD), the foods you eat and your eating habits are very important. Choosing the right foods can help ease the discomfort of GERD. WHAT GENERAL GUIDELINES DO I NEED TO FOLLOW?  Choose fruits, vegetables, whole grains, low-fat dairy products, and low-fat meat, fish, and poultry.  Limit fats such as oils, salad dressings, butter, nuts, and avocado.  Keep a food diary to identify foods that cause symptoms.  Avoid foods that cause reflux. These may be different for different people.  Eat frequent small meals instead of three large meals each day.  Eat your meals slowly, in a relaxed setting.  Limit fried foods.  Cook foods using methods other than frying.  Avoid drinking alcohol.  Avoid drinking large amounts of liquids with your meals.  Avoid bending over or lying down until 2-3 hours after eating. WHAT FOODS ARE NOT RECOMMENDED? The following are some foods and drinks that may worsen your symptoms: Vegetables Tomatoes. Tomato juice. Tomato and spaghetti sauce. Chili  peppers. Onion and garlic. Horseradish. Fruits Oranges, grapefruit, and lemon (fruit and juice). Meats High-fat meats, fish, and poultry. This includes hot dogs, ribs, ham, sausage, salami, and bacon. Dairy Whole milk and chocolate milk. Sour cream. Cream. Butter. Ice cream. Cream cheese.  Beverages Coffee and tea, with or without caffeine. Carbonated beverages or energy drinks. Condiments Hot sauce. Barbecue sauce.  Sweets/Desserts Chocolate and cocoa. Donuts. Peppermint and spearmint. Fats and Oils High-fat foods, including [place] fries and potato chips. Other Vinegar. Strong spices, such as black pepper, white pepper, red pepper, cayenne, curry powder, cloves, ginger, and chili powder. The items listed above may not be a complete list of foods and beverages to avoid. Contact your dietitian for more information.   This information is not intended to replace advice given to you by your health care provider. Make sure you discuss any questions you have with your health care provider.   Document Released: 04/14/2005 Document Revised: 05/05/2014 Document Reviewed: 02/16/2013 Elsevier Interactive Patient Education [employer].

## 2015-08-30 ENCOUNTER — Telehealth: Payer: Self-pay | Admitting: Primary Care

## 2015-08-30 ENCOUNTER — Other Ambulatory Visit: Payer: Self-pay | Admitting: Primary Care

## 2015-08-30 DIAGNOSIS — E119 Type 2 diabetes mellitus without complications: Secondary | ICD-10-CM

## 2015-08-30 LAB — BASIC METABOLIC PANEL
BUN: 19 mg/dL (ref 6–23)
CHLORIDE: 99 meq/L (ref 96–112)
CO2: 30 meq/L (ref 19–32)
Calcium: 9.5 mg/dL (ref 8.4–10.5)
Creatinine, Ser: 0.89 mg/dL (ref 0.40–1.50)
GFR: 91.93 mL/min (ref >60.00)
Glucose, Bld: 234 mg/dL — ABNORMAL HIGH (ref 70–99)
POTASSIUM: 4.8 meq/L (ref 3.5–5.1)
Sodium: 139 mEq/L (ref 135–145)

## 2015-08-30 LAB — HEMOGLOBIN A1C: Hgb A1c MFr Bld: 8.4 % — ABNORMAL HIGH (ref 4.6–6.5)

## 2015-08-30 MED ORDER — METFORMIN HCL ER 500 MG PO TB24: 1000.0000 mg | ORAL_TABLET | Freq: Every day | ORAL | Status: DC

## 2015-08-30 NOTE — Telephone Encounter (Signed)
Called and notified patient of Kate's comments.  

## 2015-08-30 NOTE — Telephone Encounter (Signed)
Pt returned your call best number to call back [phone]

## 2015-09-06 ENCOUNTER — Telehealth: Payer: Self-pay

## 2015-09-06 NOTE — Telephone Encounter (Signed)
Pt left /vm; pt was seen 08/29/15; pt was started with new diabetic med and BP med and pt wants to know if the meds could be causing diarrhea.pt taking metformin and lisinopril. Pt wants to have meds changed so will not have diarrhea. Pt request cb.[email or web address].

## 2015-09-06 NOTE — Telephone Encounter (Signed)
He is on the extended release version which shouldn't cause as much diarrhea. Would he be willing to give it 1 more week to see if diarrhea improves?

## 2015-09-07 ENCOUNTER — Other Ambulatory Visit: Payer: Self-pay | Admitting: Internal Medicine

## 2015-09-07 MED ORDER — GLIPIZIDE 10 MG PO TABS: 10.0000 mg | ORAL_TABLET | Freq: Two times a day (BID) | ORAL | Status: DC

## 2015-09-07 NOTE — Telephone Encounter (Signed)
Ok, will switch him to Glipizide 10 mg BID, sent to pharmacy

## 2015-09-07 NOTE — Telephone Encounter (Signed)
Pt states it is just too disruptive to his work and would prefer not to continue as it is worse than before---please advise

## 2015-09-07 NOTE — Telephone Encounter (Signed)
Pt is aware and to stop metformin

## 2015-09-10 ENCOUNTER — Other Ambulatory Visit: Payer: BLUE CROSS/BLUE SHIELD

## 2015-09-12 ENCOUNTER — Ambulatory Visit: Payer: Managed Care, Other (non HMO) | Admitting: Primary Care

## 2015-10-11 ENCOUNTER — Encounter: Payer: Self-pay | Admitting: Primary Care

## 2015-12-06 ENCOUNTER — Other Ambulatory Visit: Payer: Self-pay

## 2015-12-06 DIAGNOSIS — I10 Essential (primary) hypertension: Secondary | ICD-10-CM

## 2015-12-06 MED ORDER — LISINOPRIL 10 MG PO TABS: 10.0000 mg | ORAL_TABLET | Freq: Every day | ORAL | 0 refills | Status: DC

## 2015-12-06 NOTE — Telephone Encounter (Signed)
pts BP is still running high; average 140-150/90 when stressed or doing a lot of running. Usually 140/85.Pt is out of lisinopril. Refilled # 30 until pts appt on 12/19/15. If pt condition changes or worsens before his next appt pt will cb. Pt voiced understanding.[facility].

## 2015-12-18 ENCOUNTER — Ambulatory Visit (INDEPENDENT_AMBULATORY_CARE_PROVIDER_SITE_OTHER): Payer: Managed Care, Other (non HMO) | Admitting: Primary Care

## 2015-12-18 ENCOUNTER — Encounter: Payer: Self-pay | Admitting: Primary Care

## 2015-12-18 VITALS — BP 140/88 | HR 99 | Temp 98.1°F | Ht 65.75 in | Wt 270.5 lb

## 2015-12-18 DIAGNOSIS — N529 Male erectile dysfunction, unspecified: Secondary | ICD-10-CM

## 2015-12-18 DIAGNOSIS — G479 Sleep disorder, unspecified: Secondary | ICD-10-CM | POA: Diagnosis not present

## 2015-12-18 DIAGNOSIS — G4720 Circadian rhythm sleep disorder, unspecified type: Secondary | ICD-10-CM

## 2015-12-18 DIAGNOSIS — E119 Type 2 diabetes mellitus without complications: Secondary | ICD-10-CM | POA: Diagnosis not present

## 2015-12-18 DIAGNOSIS — I10 Essential (primary) hypertension: Secondary | ICD-10-CM | POA: Diagnosis not present

## 2015-12-18 LAB — COMPREHENSIVE METABOLIC PANEL
ALBUMIN: 4.1 g/dL (ref 3.5–5.2)
ALT: 21 U/L (ref 0–53)
AST: 21 U/L (ref 0–37)
Alkaline Phosphatase: 66 U/L (ref 39–117)
BUN: 17 mg/dL (ref 6–23)
CALCIUM: 9.2 mg/dL (ref 8.4–10.5)
CHLORIDE: 103 meq/L (ref 96–112)
CO2: 30 mEq/L (ref 19–32)
CREATININE: 0.82 mg/dL (ref 0.40–1.50)
GFR: 100.94 mL/min (ref >60.00)
Glucose, Bld: 135 mg/dL — ABNORMAL HIGH (ref 70–99)
Potassium: 4.3 mEq/L (ref 3.5–5.1)
Sodium: 140 mEq/L (ref 135–145)
Total Bilirubin: 0.4 mg/dL (ref 0.2–1.2)
Total Protein: 7.4 g/dL (ref 6.0–8.3)

## 2015-12-18 LAB — MICROALBUMIN / CREATININE URINE RATIO
Creatinine,U: 163.3 mg/dL
MICROALB/CREAT RATIO: 0.6 mg/g (ref 0.0–30.0)
Microalb, Ur: 1 mg/dL (ref 0.0–1.9)

## 2015-12-18 LAB — LDL CHOLESTEROL, DIRECT: Direct LDL: 109 mg/dL

## 2015-12-18 LAB — LIPID PANEL
CHOL/HDL RATIO: 4
CHOLESTEROL: 180 mg/dL (ref 0–200)
HDL: 47.9 mg/dL (ref >39.00)
NonHDL: 131.67
TRIGLYCERIDES: 201 mg/dL — AB (ref 0.0–149.0)
VLDL: 40.2 mg/dL — ABNORMAL HIGH (ref 0.0–40.0)

## 2015-12-18 LAB — HEMOGLOBIN A1C: HEMOGLOBIN A1C: 6.9 % — AB (ref 4.6–6.5)

## 2015-12-18 MED ORDER — LISINOPRIL 10 MG PO TABS: 10.0000 mg | ORAL_TABLET | Freq: Every day | ORAL | 2 refills | Status: DC

## 2015-12-18 MED ORDER — SILDENAFIL CITRATE 20 MG PO TABS: ORAL_TABLET | ORAL | 0 refills | Status: DC

## 2015-12-18 MED ORDER — GLIPIZIDE 10 MG PO TABS: 10.0000 mg | ORAL_TABLET | Freq: Two times a day (BID) | ORAL | 2 refills | Status: DC

## 2015-12-18 NOTE — Assessment & Plan Note (Signed)
Much improved and likely due to better glycemic and blood pressure control.

## 2015-12-18 NOTE — Assessment & Plan Note (Signed)
Due for repeat A1c today. Is compliant to glipizide 10 mg twice a day, refill provided today. Will adjust medications pending A1c if necessary. Lipids and urine microalbumin also pending.

## 2015-12-18 NOTE — Progress Notes (Signed)
Pre visit review using our clinic review tool, if applicable. No additional management support is needed unless otherwise documented below in the visit note. 

## 2015-12-18 NOTE — Progress Notes (Signed)
Subjective:    Patient ID: Eric Hill, male    DOB: Mar 27, 1951, 65 y.o.   MRN: [id number]  HPI  Eric Hill is a 65 year old male who presents today for follow up.  1) Essential Hypertension: Diagnosed in May 2017 and initiated on Lisinopril 10 mg on 08/29/15. He was recommended to follow up in 2 weeks for re-evaluation and BMP for which he did not return. He is checking his BP at home which is running 130's/70's-80's. Denies readings at or above 140/90. Denies chest pain, dizziness, visual changes. Over the past several months he's noticed a tickle in the back of his through that we will follow with a coughing spell. This occurs infrequently and does not bother him overall.  2) Type 2 Diabetes: Diagnosed in May 2016. Last visit he reported to stop taking  Metformin shortly after prescribed as it caused diarrhea. He never notified us of this side effect. Last visit he was initiated on Glipizide 10 mg BID as A1C was worse at 8.4. He is due for repeat A1C and urine microalbumin today. He checks his sugars fasting which are running 130's on average. Denies numbness/tingling.  Review of Systems  Eyes: Negative for visual disturbance.  Respiratory: Negative for shortness of breath.   Cardiovascular: Negative for chest pain.  Neurological: Negative for dizziness and numbness.       Past Medical History:  Diagnosis Date  . Borderline diabetes   . Erectile dysfunction   . History of depression      Social History   Social History  . Marital status: Single    Spouse name: N/A  . Number of children: N/A  . Years of education: N/A   Occupational History  . Not on file.   Social History Main Topics  . Smoking status: Former Research scientist (life sciences)  . Smokeless tobacco: Not on file  . Alcohol use 0.0 oz/week     Comment: rarely  . Drug use: No  . Sexual activity: Not on file   Other Topics Concern  . Not on file   Social History Narrative   Single   Works as a Mudlogger.   No  children   Lives in [place]   Enjoys racing cars, Designer, fashion/clothing.       No past surgical history on file.  Family History  Problem Relation Age of Onset  . Neuropathy Mother   . Cancer Sister     Back, brain  . Cancer Maternal Grandfather     Abdominal     No Known Allergies  Current Outpatient Prescriptions on File Prior to Visit  Medication Sig Dispense Refill  . blood glucose meter kit and supplies KIT Dispense based on patient and insurance preference. Use up to 2-3 times daily as directed. Dx is E11.9 1 each 0  . sildenafil (VIAGRA) 25 MG tablet Take one tablet 30 minutes to 1 hour prior to intercourse. Do not exceed one tablet per day. (Patient not taking: Reported on 08/29/2015) 10 tablet 0   No current facility-administered medications on file prior to visit.     BP 140/88   Pulse 99   Temp 98.1 F (36.7 C) (Oral)   Ht 5' 5.75" (1.67 m)   Wt 270 lb 8 oz (122.7 kg)   SpO2 97%   BMI 43.99 kg/m    Objective:   Physical Exam  Constitutional: He appears well-nourished.  Neck: Neck supple.  Cardiovascular: Normal rate and regular rhythm.   Pulmonary/Chest:  Effort normal and breath sounds normal.  Skin: Skin is warm and dry.  Psychiatric: He has a normal mood and affect.          Assessment & Plan:

## 2015-12-18 NOTE — Patient Instructions (Signed)
Complete lab work prior to leaving today. I will notify you of your results once received.   Take the Viagra prescription to [facility] next door. They offer a discount on Viagra.  Continue Glipizide 10 mg tablets for diabetes. Take 1 tablet by mouth twice daily.  Continue Lisinopril 10 mg tablets for blood pressure. Take 1 tablet by mouth once daily.  Follow up in 6 months for a complete physical exam.  It was a pleasure to see you today!  Diabetes Mellitus and Food It is important for you to manage your blood sugar (glucose) level. Your blood glucose level can be greatly affected by what you eat. Eating healthier foods in the appropriate amounts throughout the day at about the same time each day will help you control your blood glucose level. It can also help slow or prevent worsening of your diabetes mellitus. Healthy eating may even help you improve the level of your blood pressure and reach or maintain a healthy weight.  General recommendations for healthful eating and cooking habits include:  Eating meals and snacks regularly. Avoid going long periods of time without eating to lose weight.  Eating a diet that consists mainly of plant-based foods, such as fruits, vegetables, nuts, legumes, and whole grains.  Using low-heat cooking methods, such as baking, instead of high-heat cooking methods, such as deep frying. Work with your dietitian to make sure you understand how to use the Nutrition Facts information on food labels. HOW CAN FOOD AFFECT ME? Carbohydrates Carbohydrates affect your blood glucose level more than any other type of food. Your dietitian will help you determine how many carbohydrates to eat at each meal and teach you how to count carbohydrates. Counting carbohydrates is important to keep your blood glucose at a healthy level, especially if you are using insulin or taking certain medicines for diabetes mellitus. Alcohol Alcohol can cause sudden decreases in blood  glucose (hypoglycemia), especially if you use insulin or take certain medicines for diabetes mellitus. Hypoglycemia can be a life-threatening condition. Symptoms of hypoglycemia (sleepiness, dizziness, and disorientation) are similar to symptoms of having too much alcohol.  If your health care provider has given you approval to drink alcohol, do so in moderation and use the following guidelines:  Women should not have more than one drink per day, and men should not have more than two drinks per day. One drink is equal to:  12 oz of beer.  5 oz of wine.  1 oz of hard liquor.  Do not drink on an empty stomach.  Keep yourself hydrated. Have water, diet soda, or unsweetened iced tea.  Regular soda, juice, and other mixers might contain a lot of carbohydrates and should be counted. WHAT FOODS ARE NOT RECOMMENDED? As you make food choices, it is important to remember that all foods are not the same. Some foods have fewer nutrients per serving than other foods, even though they might have the same number of calories or carbohydrates. It is difficult to get your body what it needs when you eat foods with fewer nutrients. Examples of foods that you should avoid that are high in calories and carbohydrates but low in nutrients include:  Trans fats (most processed foods list trans fats on the Nutrition Facts label).  Regular soda.  Juice.  Candy.  Sweets, such as cake, pie, doughnuts, and cookies.  Fried foods. WHAT FOODS CAN I EAT? Eat nutrient-rich foods, which will nourish your body and keep you healthy. The food you should eat  also will depend on several factors, including:  The calories you need.  The medicines you take.  Your weight.  Your blood glucose level.  Your blood pressure level.  Your cholesterol level. You should eat a variety of foods, including:  Protein.  Lean cuts of meat.  Proteins low in saturated fats, such as fish, egg whites, and beans. Avoid processed  meats.  Fruits and vegetables.  Fruits and vegetables that may help control blood glucose levels, such as apples, mangoes, and yams.  Dairy products.  Choose fat-free or low-fat dairy products, such as milk, yogurt, and cheese.  Grains, bread, pasta, and rice.  Choose whole grain products, such as multigrain bread, whole oats, and brown rice. These foods may help control blood pressure.  Fats.  Foods containing healthful fats, such as nuts, avocado, olive oil, canola oil, and fish. DOES EVERYONE WITH DIABETES MELLITUS HAVE THE SAME MEAL PLAN? Because every person with diabetes mellitus is different, there is not one meal plan that works for everyone. It is very important that you meet with a dietitian who will help you create a meal plan that is just right for you.   This information is not intended to replace advice given to you by your health care provider. Make sure you discuss any questions you have with your health care provider.   Document Released: 01/09/2005 Document Revised: 05/05/2014 Document Reviewed: 03/11/2013 Elsevier Interactive Patient Education [employer].

## 2015-12-18 NOTE — Assessment & Plan Note (Signed)
Refill provided for Viagra per patient request.

## 2015-12-18 NOTE — Assessment & Plan Note (Signed)
Slightly above goal in office today, however home readings stable. Has noticed slight tickle with cough overall not bothersome. Discussed to notify me if cough becomes worse, will switch to losartan. BMP pending today, continue lisinopril 10 mg.

## 2015-12-19 ENCOUNTER — Ambulatory Visit: Payer: Managed Care, Other (non HMO) | Admitting: Primary Care

## 2015-12-20 ENCOUNTER — Telehealth: Payer: Self-pay | Admitting: Primary Care

## 2015-12-20 ENCOUNTER — Other Ambulatory Visit: Payer: Self-pay | Admitting: Primary Care

## 2015-12-20 DIAGNOSIS — E785 Hyperlipidemia, unspecified: Secondary | ICD-10-CM

## 2015-12-20 MED ORDER — ATORVASTATIN CALCIUM 20 MG PO TABS: 20.0000 mg | ORAL_TABLET | Freq: Every day | ORAL | 3 refills | Status: DC

## 2015-12-20 NOTE — Telephone Encounter (Signed)
Pt returned call re: lab results  [phone]

## 2015-12-20 NOTE — Telephone Encounter (Signed)
Spoken and notified patient of Kate's comments. Patient verbalized understanding. 

## 2016-01-07 ENCOUNTER — Ambulatory Visit: Payer: Managed Care, Other (non HMO) | Admitting: Primary Care

## 2016-01-10 ENCOUNTER — Ambulatory Visit: Payer: Managed Care, Other (non HMO) | Admitting: Primary Care

## 2016-02-22 ENCOUNTER — Telehealth: Payer: Self-pay

## 2016-02-22 NOTE — Telephone Encounter (Signed)
Spoke to pt. He will try to limit his diet to low carb. He will call the doctor on call if it gets to high over the weekend

## 2016-02-22 NOTE — Telephone Encounter (Signed)
Pt left v/m; pt has been taking glipizide and BS is usually 107; pt fell and taking prednisone and BS is over 207. Pt request cb.

## 2016-02-22 NOTE — Telephone Encounter (Signed)
Please notify patient that prednisone will cause an increase in blood sugars temporarily. He will need to be extra careful with his diet while on prednisone. The sugars should return to his baseline once he's completed prednisone.

## 2016-02-26 ENCOUNTER — Telehealth: Payer: Self-pay | Admitting: Primary Care

## 2016-02-26 NOTE — Telephone Encounter (Signed)
Noted. We will call and check on patient next week.

## 2016-02-26 NOTE — Telephone Encounter (Signed)
[facility]  Patient Name: TEMILOLUWA SPURLOCK  DOB: [birth year]    Initial Comment Caller states 2 months ago he was injured, on prednisone. Now bp is up, having trouble sleeping. 175/100 this morning, headache. BS might be up, has not checked this morning.   Nurse Assessment  Nurse: Wayne Sever, RN, Tillie Rung Date/Time (Eastern Time): 02/26/2016 8:52:39 AM  Confirm and document reason for call. If symptomatic, describe symptoms. You must click the next button to save text entered. ---Caller states he fell and had an injury, he states he cannot sleep on the Prednisone. He wants to know what he can take for sleep. He only has 3 days left of the medication  Has the patient traveled out of the country within the last 30 days? ---Not Applicable  Does the patient have any new or worsening symptoms? ---Yes  Will a triage be completed? ---Yes  Related visit to physician within the last 2 weeks? ---No  Does the PT have any chronic conditions? (i.e. diabetes, asthma, etc.) ---Yes  List chronic conditions. ---HTN, Diabetes  Is this a behavioral health or substance abuse call? ---No     Guidelines    Guideline Title Affirmed Question Affirmed Notes  Insomnia Over-the-counter sleeping pills, questions about    Final Disposition User   [place], RN, Tillie Rung    Comments  Also informed caller to drink plenty of fluids on his steroids to keep blood sugar under control. He states understanding. That advice given per general nursing knowledge   Disagree/Comply: Comply

## 2016-03-03 ENCOUNTER — Telehealth: Payer: Self-pay | Admitting: Primary Care

## 2016-03-03 NOTE — Telephone Encounter (Signed)
Message left for patient to return my call.  

## 2016-03-03 NOTE — Telephone Encounter (Addendum)
-----   Message from Pleas [name], NP sent at 02/26/2016  2:40 PM EDT ----- Regarding: BP and BS Please check on patient's blood sugar and BP since he stopped taking prednisone. What is his BP? What are his blood sugars fasting?

## 2016-06-30 ENCOUNTER — Encounter: Payer: Self-pay | Admitting: Primary Care

## 2016-06-30 LAB — HM DIABETES EYE EXAM

## 2016-09-01 ENCOUNTER — Ambulatory Visit (INDEPENDENT_AMBULATORY_CARE_PROVIDER_SITE_OTHER): Payer: 59 | Admitting: Internal Medicine

## 2016-09-01 ENCOUNTER — Encounter: Payer: Self-pay | Admitting: Internal Medicine

## 2016-09-01 VITALS — BP 144/74 | HR 91 | Temp 98.0°F | Wt 255.0 lb

## 2016-09-01 DIAGNOSIS — J209 Acute bronchitis, unspecified: Secondary | ICD-10-CM

## 2016-09-01 MED ORDER — HYDROCODONE-HOMATROPINE 5-1.5 MG/5ML PO SYRP: 5.0000 mL | ORAL_SOLUTION | Freq: Every evening | ORAL | 0 refills | Status: DC | PRN

## 2016-09-01 MED ORDER — AZITHROMYCIN 250 MG PO TABS: ORAL_TABLET | ORAL | 0 refills | Status: DC

## 2016-09-01 NOTE — Progress Notes (Signed)
HPI  Pt presents to the clinic today with c/o headache, runny nose, nasal congestion, cough and chest congestion. This started 4 days ago. He is blowing clear mucous out of his nose. The cough is productive of brown mucous. He denies fever, chills or body aches. He has tried Mucinex DM, Afrin and Tussin with minimal relief. He reports he has seasonal allergies and chronic bronchitis. He does not smoke. He has had sick contacts that he is aware of.  Review of Systems        Past Medical History:  Diagnosis Date  . Borderline diabetes   . Erectile dysfunction   . History of depression     Family History  Problem Relation Age of Onset  . Neuropathy Mother   . Cancer Sister     Back, brain  . Cancer Maternal Grandfather     Abdominal     Social History   Social History  . Marital status: Single    Spouse name: N/A  . Number of children: N/A  . Years of education: N/A   Occupational History  . Not on file.   Social History Main Topics  . Smoking status: Former Research scientist (life sciences)  . Smokeless tobacco: Never Used  . Alcohol use 0.0 oz/week     Comment: rarely  . Drug use: No  . Sexual activity: Not on file   Other Topics Concern  . Not on file   Social History Narrative   Single   Works as a Mudlogger.   No children   Lives in [place]   Enjoys racing cars, Designer, fashion/clothing.       No Known Allergies   Constitutional: Positive headache. Denies fatigue, fever or abrupt weight changes.  HEENT:  Positive runny nose, nasal congestion. Denies eye redness, eye pain, pressure behind the eyes, facial pain, ear pain, ringing in the ears, wax buildup or sore throat. Respiratory: Positive cough. Denies difficulty breathing or shortness of breath.  Cardiovascular: Denies chest pain, chest tightness, palpitations or swelling in the hands or feet.   No other specific complaints in a complete review of systems (except as listed in HPI above).  Objective:   BP (!) 144/74    Pulse 91   Temp 98 F (36.7 C) (Oral)   Wt 255 lb (115.7 kg)   SpO2 96%   BMI 41.47 kg/m   Wt Readings from Last 3 Encounters:  09/01/16 255 lb (115.7 kg)  12/18/15 270 lb 8 oz (122.7 kg)  08/29/15 271 lb 6.4 oz (123.1 kg)     General: Appears his stated age, obese, in NAD. HEENT: Head: normal shape and size, no sinus tenderness noted; Ears: Tm's pink but intact, normal light reflex; Nose: mucosa boggy and moist, turbinates swollen but septum midline; Throat/Mouth: Teeth present, mucosa pink and moist, no exudate noted, no lesions or ulcerations noted.  Neck: No cervical lymphadenopathy.  Cardiovascular: Normal rate and rhythm.  Pulmonary/Chest: Normal effort and scattered rhonchi with bilateral expiratory wheezing. No respiratory distress. No rales noted.       Assessment & Plan:   Acute Bronchitis:  Get some rest and drink plenty of water 80 mg Depo IM - monitor sugars Start Flonase OTC eRx for Azithromax x 5 days Rx for Hycodan cough syrup  RTC as needed or if symptoms persist.   Webb Silversmith, NP

## 2016-09-01 NOTE — Patient Instructions (Signed)

## 2016-09-02 MED ORDER — METHYLPREDNISOLONE ACETATE 80 MG/ML IJ SUSP
80.0000 mg | Freq: Once | INTRAMUSCULAR | Status: AC
  Administered 2016-09-01: 80 mg via INTRAMUSCULAR

## 2016-09-02 NOTE — Addendum Note (Signed)
Addended by: Lurlean Nanny on: 09/02/2016 02:15 PM   Modules accepted: Orders

## 2016-09-10 ENCOUNTER — Other Ambulatory Visit: Payer: Self-pay | Admitting: Primary Care

## 2016-09-10 DIAGNOSIS — E559 Vitamin D deficiency, unspecified: Secondary | ICD-10-CM

## 2016-09-10 DIAGNOSIS — E119 Type 2 diabetes mellitus without complications: Secondary | ICD-10-CM

## 2016-09-10 DIAGNOSIS — E785 Hyperlipidemia, unspecified: Secondary | ICD-10-CM

## 2016-09-15 ENCOUNTER — Other Ambulatory Visit (INDEPENDENT_AMBULATORY_CARE_PROVIDER_SITE_OTHER): Payer: 59

## 2016-09-15 DIAGNOSIS — E559 Vitamin D deficiency, unspecified: Secondary | ICD-10-CM | POA: Diagnosis not present

## 2016-09-15 DIAGNOSIS — E119 Type 2 diabetes mellitus without complications: Secondary | ICD-10-CM

## 2016-09-15 DIAGNOSIS — E785 Hyperlipidemia, unspecified: Secondary | ICD-10-CM

## 2016-09-15 LAB — COMPREHENSIVE METABOLIC PANEL
ALBUMIN: 4.1 g/dL (ref 3.5–5.2)
ALT: 15 U/L (ref 0–53)
AST: 17 U/L (ref 0–37)
Alkaline Phosphatase: 62 U/L (ref 39–117)
BUN: 21 mg/dL (ref 6–23)
CHLORIDE: 102 meq/L (ref 96–112)
CO2: 28 mEq/L (ref 19–32)
CREATININE: 0.84 mg/dL (ref 0.40–1.50)
Calcium: 9.4 mg/dL (ref 8.4–10.5)
GFR: 97.94 mL/min (ref >60.00)
GLUCOSE: 151 mg/dL — AB (ref 70–99)
POTASSIUM: 4.3 meq/L (ref 3.5–5.1)
SODIUM: 137 meq/L (ref 135–145)
TOTAL PROTEIN: 7.7 g/dL (ref 6.0–8.3)
Total Bilirubin: 0.9 mg/dL (ref 0.2–1.2)

## 2016-09-15 LAB — LIPID PANEL
CHOLESTEROL: 157 mg/dL (ref 0–200)
HDL: 45.2 mg/dL (ref >39.00)
LDL CALC: 92 mg/dL (ref 0–99)
NonHDL: 111.48
Total CHOL/HDL Ratio: 3
Triglycerides: 97 mg/dL (ref 0.0–149.0)
VLDL: 19.4 mg/dL (ref 0.0–40.0)

## 2016-09-15 LAB — HEMOGLOBIN A1C: Hgb A1c MFr Bld: 6.3 % (ref 4.6–6.5)

## 2016-09-15 LAB — VITAMIN D 25 HYDROXY (VIT D DEFICIENCY, FRACTURES): VITD: 19.46 ng/mL — ABNORMAL LOW (ref 30.00–100.00)

## 2016-09-17 ENCOUNTER — Encounter: Payer: Self-pay | Admitting: Primary Care

## 2016-09-17 ENCOUNTER — Ambulatory Visit (INDEPENDENT_AMBULATORY_CARE_PROVIDER_SITE_OTHER): Payer: 59 | Admitting: Primary Care

## 2016-09-17 VITALS — BP 140/74 | HR 66 | Temp 97.9°F | Ht 65.75 in | Wt 248.8 lb

## 2016-09-17 DIAGNOSIS — E559 Vitamin D deficiency, unspecified: Secondary | ICD-10-CM | POA: Diagnosis not present

## 2016-09-17 DIAGNOSIS — R3915 Urgency of urination: Secondary | ICD-10-CM | POA: Insufficient documentation

## 2016-09-17 DIAGNOSIS — F320 Major depressive disorder, single episode, mild: Secondary | ICD-10-CM

## 2016-09-17 DIAGNOSIS — Z0000 Encounter for general adult medical examination without abnormal findings: Secondary | ICD-10-CM | POA: Diagnosis not present

## 2016-09-17 DIAGNOSIS — N529 Male erectile dysfunction, unspecified: Secondary | ICD-10-CM

## 2016-09-17 DIAGNOSIS — F329 Major depressive disorder, single episode, unspecified: Secondary | ICD-10-CM | POA: Insufficient documentation

## 2016-09-17 DIAGNOSIS — Z125 Encounter for screening for malignant neoplasm of prostate: Secondary | ICD-10-CM

## 2016-09-17 DIAGNOSIS — Z1211 Encounter for screening for malignant neoplasm of colon: Secondary | ICD-10-CM | POA: Diagnosis not present

## 2016-09-17 DIAGNOSIS — E349 Endocrine disorder, unspecified: Secondary | ICD-10-CM | POA: Diagnosis not present

## 2016-09-17 DIAGNOSIS — E119 Type 2 diabetes mellitus without complications: Secondary | ICD-10-CM | POA: Diagnosis not present

## 2016-09-17 DIAGNOSIS — I10 Essential (primary) hypertension: Secondary | ICD-10-CM | POA: Diagnosis not present

## 2016-09-17 MED ORDER — TAMSULOSIN HCL 0.4 MG PO CAPS: ORAL_CAPSULE | ORAL | 0 refills | Status: DC

## 2016-09-17 MED ORDER — GLIPIZIDE 10 MG PO TABS: 10.0000 mg | ORAL_TABLET | Freq: Two times a day (BID) | ORAL | 1 refills | Status: DC

## 2016-09-17 MED ORDER — LISINOPRIL 10 MG PO TABS: 10.0000 mg | ORAL_TABLET | Freq: Every day | ORAL | 1 refills | Status: DC

## 2016-09-17 MED ORDER — VITAMIN D (ERGOCALCIFEROL) 1.25 MG (50000 UNIT) PO CAPS: ORAL_CAPSULE | ORAL | 0 refills | Status: DC

## 2016-09-17 NOTE — Patient Instructions (Addendum)
Start Tamsulosin (Flomax) capsules for urine flow. Take 1 capsule by mouth every morning with breakfast.   Start vitamin D capsules for vitamin D. Take 1 capsule by mouth once weekly for 12 weeks.  Continue sertraline (Zoloft) tablets for depression and anxiety. Take 1/2 tablet daily for 8 days, then increase to 1 full tablet thereafter. You may decrease back down to 1/2 tablet if needed.  Continue to work on improvements in your diet.  Start exercising. You should be getting 150 minutes of moderate intensity exercise weekly.  Complete lab work prior to leaving today. I will notify you of your results once received.   Follow up in 6 months for re-evaluation.  It was a pleasure to see you today!

## 2016-09-17 NOTE — Assessment & Plan Note (Signed)
Symptoms today likely secondary from enlarged prostate. Check PSA today. Start Flomax once daily with breakfast, he will update in a few weeks.

## 2016-09-17 NOTE — Assessment & Plan Note (Addendum)
Recent A1c of 6.3. Continue glipizide 10 mg twice a day. Continue working on diet and start exercising. Follow-up in 6 months.

## 2016-09-17 NOTE — Assessment & Plan Note (Signed)
Also with fatigue. Check testosterone level today. He declines urology evaluation for possible replacement as he's done well on the over-the-counter supplement.

## 2016-09-17 NOTE — Assessment & Plan Note (Signed)
Above goal today, also on prior visits. Also recovering from a respiratory infection. We'll have him start monitoring at home and check on readings in a few weeks. If above goal, increase lisinopril to 20 mg.

## 2016-09-17 NOTE — Assessment & Plan Note (Signed)
Tetanus up-to-date, declines shingles vaccination, due for pneumonia although overcoming URI. Will provide pneumonia vaccination at next visit. PSA pending. Colonoscopy due, pending. Exam today unremarkable. Labs overall stable. Check testosterone and PSA today. Follow-up in one year for annual exam.

## 2016-09-17 NOTE — Assessment & Plan Note (Signed)
Currently grieving the loss of his girlfriend who passed away in fall 07-31-2015. Discussed correct use of Zoloft and that it is to be taken daily. Will have him start with one half tablet daily for 8 days, then increase to 1 full tablet thereafter. He will follow-up if no improvement within 4-6 weeks. He denies SI/HI.

## 2016-09-17 NOTE — Assessment & Plan Note (Signed)
Recent level of 19, repeat vitamin D 50,000 unit capsules once weekly. Recheck in 3 months.

## 2016-09-17 NOTE — Progress Notes (Signed)
Subjective:    Patient ID: Eric Hill, male    DOB: 03-Jul-1950, 66 y.o.   MRN: [id number]  HPI  Eric Hill is a 66 year old male who presents today for complete physical.  1) Type 2 Diabetes: Currently managed on Glipizide 10 mg BID. Recent A1C of 6.3. He's been checking his blood sugars twice daily (AM and PM) and gets readings of 140's-150's. He has reduced sodas.   2) Essential Hypertension: Currently managed on lisinopril 10 mg. His BP in the office today is 140/74. He is still recovering from a URI. He does not check his BP at home.  3) Hyperlipidemia: Currently managed on atorvastatin 20 mg. Recent lipid panel unremarkable.   4) Grief/Depression: Lost his girlfriend in November 2017 and is having a hard time coping. He's been seeing Dr. Lucky Rathke. Originally prescribed Xanax, started taking this frequently and was switched to Zoloft 50 mg and is currently taking 25 mg as he experienced vivid nightmares. He has been taking the Zoloft infrequently, has taken 1.5 tablets total beginning Saturday this past weekend. He has noticed improvement on the Zoloft and plans on taking it more consistently.  5) Urinary Urgency: Difficulty initiating his stream, weaker stream. He will also experience urgency with difficulty urinating. This has been present for the past 1 year, gradual decline in urinary function. He has noticed fatigue. He has a history of testosterone deficiency and has taken OTC testosterone with improvement. He is not interested in seeing a Urologist. He has never undergone testosterone replacement.  Immunizations: -Tetanus: Completed in 2017 -Influenza: Did not complete last season -Pneumonia: Due, recovering from URI. -Shingles: Declines  Diet: He endorses a fair diet. Breakfast: Egg biscuit, sausage biscuit Lunch: Cold sandwich Dinner: Meat, vegetables Snacks: Occasionally chips Desserts: Fruit cocktail, occasionally fruit snacks Beverages: Water, occasionally  soda  Exercise: He does not currently exercise Eye exam: Completed in March 2018 Dental exam: Completed recently Colonoscopy: Completed over 10 years ago. Due.  PSA: Due    Review of Systems  Constitutional: Negative for unexpected weight change.  HENT: Negative for rhinorrhea.   Respiratory: Negative for cough and shortness of breath.   Cardiovascular: Negative for chest pain.  Gastrointestinal: Negative for constipation and diarrhea.  Genitourinary: Positive for decreased urine volume, difficulty urinating and urgency.  Musculoskeletal: Negative for arthralgias and myalgias.  Skin: Negative for rash.  Allergic/Immunologic: Negative for environmental allergies.  Neurological: Negative for dizziness, numbness and headaches.  Psychiatric/Behavioral:       See HPI       Past Medical History:  Diagnosis Date  . Diverticulitis   . Erectile dysfunction   . History of depression   . Type 2 diabetes mellitus ([place])      Social History   Social History  . Marital status: Single    Spouse name: N/A  . Number of children: N/A  . Years of education: N/A   Occupational History  . Not on file.   Social History Main Topics  . Smoking status: Former Research scientist (life sciences)  . Smokeless tobacco: Never Used  . Alcohol use 0.0 oz/week     Comment: rarely  . Drug use: No  . Sexual activity: Not on file   Other Topics Concern  . Not on file   Social History Narrative   Single   Works as a Mudlogger.   No children   Lives in [place]   Enjoys racing cars, Designer, fashion/clothing.       No past  surgical history on file.  Family History  Problem Relation Age of Onset  . Neuropathy Mother   . Cancer Sister        Back, brain  . Cancer Maternal Grandfather        Abdominal     No Known Allergies  Current Outpatient Prescriptions on File Prior to Visit  Medication Sig Dispense Refill  . atorvastatin (LIPITOR) 20 MG tablet Take 1 tablet (20 mg total) by mouth daily. 90 tablet 3    . blood glucose meter kit and supplies KIT Dispense based on patient and insurance preference. Use up to 2-3 times daily as directed. Dx is E11.9 1 each 0  . glipiZIDE (GLUCOTROL) 10 MG tablet Take 1 tablet (10 mg total) by mouth 2 (two) times daily before a meal. 180 tablet 2  . lisinopril (PRINIVIL,ZESTRIL) 10 MG tablet Take 1 tablet (10 mg total) by mouth daily. 90 tablet 2  . sildenafil (REVATIO) 20 MG tablet Take 1-3 tablets by mouth as needed for erectile dysfunction. 50 tablet 0   No current facility-administered medications on file prior to visit.     BP 140/74   Pulse 66   Temp 97.9 F (36.6 C) (Oral)   Ht 5' 5.75" (1.67 m)   Wt 248 lb 12.8 oz (112.9 kg)   SpO2 96%   BMI 40.46 kg/m    Objective:   Physical Exam  Constitutional: He is oriented to person, place, and time. He appears well-nourished.  HENT:  Right Ear: Tympanic membrane and ear canal normal.  Left Ear: Tympanic membrane and ear canal normal.  Nose: Nose normal. Right sinus exhibits no maxillary sinus tenderness and no frontal sinus tenderness. Left sinus exhibits no maxillary sinus tenderness and no frontal sinus tenderness.  Mouth/Throat: Oropharynx is clear and moist.  Eyes: Conjunctivae and EOM are normal. Pupils are equal, round, and reactive to light.  Neck: Neck supple. Carotid bruit is not present. No thyromegaly present.  Cardiovascular: Normal rate, regular rhythm and normal heart sounds.   Pulmonary/Chest: Effort normal and breath sounds normal. He has no wheezes. He has no rales.  Abdominal: Soft. Bowel sounds are normal. There is no tenderness.  Musculoskeletal: Normal range of motion.  Neurological: He is alert and oriented to person, place, and time. He has normal reflexes. No cranial nerve deficit.  Skin: Skin is warm and dry.  Psychiatric: He has a normal mood and affect.          Assessment & Plan:

## 2016-09-18 LAB — PSA, TOTAL WITH REFLEX TO PSA, FREE: PSA, TOTAL: 0.7 ng/mL (ref <=4.0)

## 2016-09-20 LAB — TESTOS,TOTAL,FREE AND SHBG (FEMALE)
Sex Hormone Binding Glob.: 47 nmol/L (ref 22–77)
Testosterone, Free: 21.9 pg/mL — ABNORMAL LOW (ref 35.0–155.0)
Testosterone,Total,LC/MS/MS: 238 ng/dL — ABNORMAL LOW (ref 250–1100)

## 2016-09-24 ENCOUNTER — Telehealth: Payer: Self-pay | Admitting: Primary Care

## 2016-09-24 NOTE — Telephone Encounter (Signed)
Spoken and notified patient of Kate's comments. Patient verbalized understanding. 

## 2016-09-24 NOTE — Telephone Encounter (Signed)
Patient returned Chan's call about his lab results.

## 2016-12-15 ENCOUNTER — Other Ambulatory Visit: Payer: Self-pay | Admitting: Primary Care

## 2016-12-15 DIAGNOSIS — R3915 Urgency of urination: Secondary | ICD-10-CM

## 2016-12-15 MED ORDER — SERTRALINE HCL 50 MG PO TABS
50.0000 mg | ORAL_TABLET | Freq: Every day | ORAL | 3 refills | Status: DC
Start: 2016-12-15 — End: 2017-02-16

## 2016-12-15 NOTE — Telephone Encounter (Signed)
Patient requests refill on generic Zoloft, refer to 09/17/16 office note. Patient was informed zoloft would be refilled.

## 2016-12-15 NOTE — Telephone Encounter (Signed)
Pt returned your call Best number [phone]  [facility] [address] in [place]

## 2016-12-15 NOTE — Addendum Note (Signed)
Addended by: Lurlean Nanny on: 12/15/2016 12:44 PM   Modules accepted: Orders

## 2016-12-15 NOTE — Telephone Encounter (Signed)
I see it in the note but not on the med list. Can we verify dose, so that I can send it in.

## 2017-02-12 ENCOUNTER — Other Ambulatory Visit: Payer: Self-pay | Admitting: Primary Care

## 2017-02-12 DIAGNOSIS — I10 Essential (primary) hypertension: Secondary | ICD-10-CM

## 2017-02-16 ENCOUNTER — Telehealth: Payer: Self-pay | Admitting: *Deleted

## 2017-02-16 ENCOUNTER — Other Ambulatory Visit: Payer: Self-pay

## 2017-02-16 ENCOUNTER — Telehealth: Payer: Self-pay | Admitting: Primary Care

## 2017-02-16 DIAGNOSIS — R3915 Urgency of urination: Secondary | ICD-10-CM

## 2017-02-16 MED ORDER — SERTRALINE HCL 50 MG PO TABS: 50.0000 mg | ORAL_TABLET | Freq: Every day | ORAL | 2 refills | Status: DC

## 2017-02-16 MED ORDER — SERTRALINE HCL 50 MG PO TABS: 50.0000 mg | ORAL_TABLET | Freq: Every day | ORAL | 3 refills | Status: DC

## 2017-02-16 MED ORDER — TAMSULOSIN HCL 0.4 MG PO CAPS: ORAL_CAPSULE | ORAL | 0 refills | Status: DC

## 2017-02-16 NOTE — Telephone Encounter (Signed)
Copied from [place] #[id number]. Topic: Quick Communication - See Telephone Encounter >> Feb 16, 2017  1:43 PM Vernona Rieger wrote: CRM for notification. See Telephone encounter for:  02/16/17. Patient called this am stating he needs his blood pressure meds refilled, his urine flow meds and generic zoloft refilled.

## 2017-02-16 NOTE — Telephone Encounter (Signed)
Copied from [place] #[id number]. Topic: Quick Communication - See Telephone Encounter >> Feb 16, 2017  8:48 AM Aurelio Brash B wrote: CRM for notification. See Telephone encounter for: 02/16/17. >> Feb 16, 2017 10:42 AM Helene Shoe, LPN wrote: I cannot find telephone encounter 02/16/17; please advise.

## 2017-02-16 NOTE — Addendum Note (Signed)
Addended by: Jacqualin Combes on: 02/16/2017 04:51 PM   Modules accepted: Orders

## 2017-02-16 NOTE — Telephone Encounter (Signed)
-----   Message from Denman George, RN sent at 02/16/2017  4:04 PM EDT ----- Regarding: needs medication refills Contact: [phone] I was trying to assist in getting this pt's meds refilled; I sent an Rx for Sertraline, and was not aware that it could not be electronically sent. (this may have printed in your office)  He is also requesting refills on Tamsulosin, and Lisinopril.  (I advised him to check on the Lisinopril at the pharmacy, as it was refilled on 10/18)  Can you reorder his Sertraline, and Tamsulosin?  Thank you.

## 2017-02-16 NOTE — Telephone Encounter (Signed)
Copied from [place] #[id number]. Topic: Quick Communication - See Telephone Encounter >> Feb 16, 2017  1:37 PM Vernona Rieger wrote: CRM for notification. See Telephone encounter for:  02/16/17. Patient needs refill on his blood pressure meds, his urine flow meds & his generic flow meds. He states he is completely out.

## 2017-02-16 NOTE — Telephone Encounter (Signed)
Refilled of has been sent for Sertraline and Tamsulosin

## 2017-03-23 ENCOUNTER — Ambulatory Visit: Payer: 59 | Admitting: Primary Care

## 2017-03-24 ENCOUNTER — Encounter: Payer: Self-pay | Admitting: Primary Care

## 2017-03-24 ENCOUNTER — Ambulatory Visit: Payer: 59 | Admitting: Primary Care

## 2017-03-24 VITALS — BP 120/78 | HR 74 | Temp 98.2°F | Ht 65.75 in | Wt 254.8 lb

## 2017-03-24 DIAGNOSIS — E119 Type 2 diabetes mellitus without complications: Secondary | ICD-10-CM

## 2017-03-24 DIAGNOSIS — F320 Major depressive disorder, single episode, mild: Secondary | ICD-10-CM

## 2017-03-24 DIAGNOSIS — R3915 Urgency of urination: Secondary | ICD-10-CM | POA: Diagnosis not present

## 2017-03-24 DIAGNOSIS — I10 Essential (primary) hypertension: Secondary | ICD-10-CM

## 2017-03-24 LAB — HEMOGLOBIN A1C: Hgb A1c MFr Bld: 6.1 % (ref 4.6–6.5)

## 2017-03-24 NOTE — Assessment & Plan Note (Signed)
Stable in the office today, continue lisinopril 

## 2017-03-24 NOTE — Patient Instructions (Signed)
Complete lab work prior to leaving today. I will notify you of your results once received.   Start exercising. You should be getting 150 minutes of moderate intensity exercise weekly.  Stop eating fast food, this contains a lot of calories and fat.   Increase vegetables, fruit, whole grains.  Ensure you are consuming 64 ounces of water daily.  Please schedule a physical with me in May 2019. You may also schedule a lab only appointment 3-4 days prior. We will discuss your lab results in detail during your physical.  It was a pleasure to see you today!

## 2017-03-24 NOTE — Assessment & Plan Note (Addendum)
Due for repeat A1C today. Fasting blood sugars seem stable. Foot exam today unremarkable. Declines pneumonia vaccination. Managed on statin and ACE.  Discussed the importance of a healthy diet and regular exercise in order for weight loss, and to reduce the risk of other medical problems.

## 2017-03-24 NOTE — Assessment & Plan Note (Signed)
Feels well managed on Zoloft, continue same.

## 2017-03-24 NOTE — Progress Notes (Signed)
Subjective:    Patient ID: Eric Hill, male    DOB: 11-11-1950, 66 y.o.   MRN: [id number]  HPI  Eric Hill is a 66 year old male who presents today for follow up.  1) Type 2 Diabetes:   Current medications include:   He is checking his blood glucose infrequently. When he does check it it's ranging 80-120. These are AM fasting or when he's not eaten much.   Last A1C: 6.3 in May 2018 Last Eye Exam: March 2018 Last Foot Exam: Due today. Pneumonia Vaccination: Never completed, declines ACE/ARB: ACE Statin: atorvastatin   Diet currently consists of:  Breakfast: Fast food Lunch: Fast food Dinner: Meat, vegetables, starch Snacks: Occasionally  Desserts: Several times weekly (candy bar) Beverages: Soda, water  Exercise: He is not currently exercising.   2) Depression: Currently managed on Zoloft 50 mg. Feels well managed, denies SI/HI.   3) BPH/Urinary Urgency: Currently managed on tamsulosin 0.4 mc capsules. Overall experiencing a decrease in urinary frequency.     Review of Systems  Constitutional: Negative for fatigue.  Respiratory: Negative for shortness of breath.   Cardiovascular: Negative for chest pain.  Genitourinary:       Urinary frequency and urgency improved on Flomax  Psychiatric/Behavioral:       Feels well managed on Zoloft.       Past Medical History:  Diagnosis Date  . Diverticulitis   . Erectile dysfunction   . History of depression   . Type 2 diabetes mellitus ([place])      Social History   Socioeconomic History  . Marital status: Single    Spouse name: Not on file  . Number of children: Not on file  . Years of education: Not on file  . Highest education level: Not on file  Social Needs  . Financial resource strain: Not on file  . Food insecurity - worry: Not on file  . Food insecurity - inability: Not on file  . Transportation needs - medical: Not on file  . Transportation needs - non-medical: Not on file  Occupational  History  . Not on file  Tobacco Use  . Smoking status: Former Research scientist (life sciences)  . Smokeless tobacco: Never Used  Substance and Sexual Activity  . Alcohol use: Yes    Alcohol/week: 0.0 oz    Comment: rarely  . Drug use: No  . Sexual activity: Not on file  Other Topics Concern  . 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    No past surgical history on file.  Family History  Problem Relation Age of Onset  . Neuropathy Mother   . Cancer Sister        Back, brain  . Cancer Maternal Grandfather        Abdominal     No Known Allergies  Current Outpatient Medications on File Prior to Visit  Medication Sig Dispense Refill  . atorvastatin (LIPITOR) 20 MG tablet Take 1 tablet (20 mg total) by mouth daily. 90 tablet 3  . blood glucose meter kit and supplies KIT Dispense based on patient and insurance preference. Use up to 2-3 times daily as directed. Dx is E11.9 1 each 0  . glipiZIDE (GLUCOTROL) 10 MG tablet Take 1 tablet (10 mg total) by mouth 2 (two) times daily before a meal. 180 tablet 1  . lisinopril (  PRINIVIL,ZESTRIL) 10 MG tablet TAKE 1 TABLET BY MOUTH ONCE DAILY 90 tablet 1  . sertraline (ZOLOFT) 50 MG tablet Take 1 tablet (50 mg total) by mouth daily. 30 tablet 2  . tamsulosin (FLOMAX) 0.4 MG CAPS capsule TAKE 1 CAPSULE BY MOUTH ONCE DAILY WITH BREAKFAST FOR URINE FLOW 90 capsule 0  . sildenafil (REVATIO) 20 MG tablet Take 1-3 tablets by mouth as needed for erectile dysfunction. (Patient not taking: Reported on 03/24/2017) 50 tablet 0  . Vitamin D, Ergocalciferol, (DRISDOL) 50000 units CAPS capsule Take 1 capsule by mouth once weekly. (Patient not taking: Reported on 03/24/2017) 12 capsule 0   No current facility-administered medications on file prior to visit.     BP 120/78   Pulse 74   Temp 98.2 F (36.8 C) (Oral)   Ht 5' 5.75" (1.67 m)   Wt 254 lb 12.8 oz (115.6  kg)   SpO2 94%   BMI 41.44 kg/m    Objective:   Physical Exam  Constitutional: He appears well-nourished.  Neck: Neck supple.  Cardiovascular: Normal rate and regular rhythm.  Pulmonary/Chest: Effort normal and breath sounds normal.  Skin: Skin is warm and dry.          Assessment & Plan:

## 2017-03-24 NOTE — Assessment & Plan Note (Signed)
Overall doing well on Flomax, continue same.

## 2017-03-25 ENCOUNTER — Encounter: Payer: Self-pay | Admitting: *Deleted

## 2017-04-16 ENCOUNTER — Other Ambulatory Visit: Payer: Self-pay

## 2017-04-16 ENCOUNTER — Telehealth: Payer: Self-pay | Admitting: Primary Care

## 2017-04-16 ENCOUNTER — Other Ambulatory Visit: Payer: Self-pay | Admitting: Primary Care

## 2017-04-16 DIAGNOSIS — I10 Essential (primary) hypertension: Secondary | ICD-10-CM

## 2017-04-16 DIAGNOSIS — R3915 Urgency of urination: Secondary | ICD-10-CM

## 2017-04-16 DIAGNOSIS — E119 Type 2 diabetes mellitus without complications: Secondary | ICD-10-CM

## 2017-04-16 MED ORDER — SERTRALINE HCL 50 MG PO TABS: 50.0000 mg | ORAL_TABLET | Freq: Every day | ORAL | 2 refills | Status: DC

## 2017-04-16 MED ORDER — LISINOPRIL 10 MG PO TABS: 10.0000 mg | ORAL_TABLET | Freq: Every day | ORAL | 1 refills | Status: DC

## 2017-04-16 MED ORDER — TAMSULOSIN HCL 0.4 MG PO CAPS: ORAL_CAPSULE | ORAL | 0 refills | Status: DC

## 2017-04-16 NOTE — Telephone Encounter (Signed)
Copied from [id number]. Topic: Quick Communication - Rx Refill/Question >> Apr 16, 2017  9:41 AM Arletha Grippe wrote: Has the patient contacted their pharmacy? Yes.     (Agent: If no, request that the patient contact the pharmacy for the refill.)   Preferred Pharmacy (with phone number [facility] street name): [facility] on [address] Pt needs zoloft refill, glipizide refill, tamsulosin, refill, and lisinopril. Pt states that pharmacy can not refill these.  Cb number is [phone]    Agent: Please be advised that RX refills may take up to 3 business days. We ask that you follow-up with your pharmacy.

## 2017-05-19 ENCOUNTER — Telehealth: Payer: Self-pay | Admitting: Primary Care

## 2017-05-19 DIAGNOSIS — R3915 Urgency of urination: Secondary | ICD-10-CM

## 2017-05-19 MED ORDER — TAMSULOSIN HCL 0.4 MG PO CAPS
ORAL_CAPSULE | ORAL | 0 refills | Status: DC
Start: 2017-05-19 — End: 2017-12-14

## 2017-05-19 NOTE — Telephone Encounter (Signed)
Copied from [place]. Topic: Quick Communication - Rx Refill/Question >> May 19, 2017  8:46 AM Synthia Innocent wrote: Medication:  tamsulosin (FLOMAX) 0.4 MG CAPS capsule  Has the patient contacted their pharmacy? Yes.     (Agent: If no, request that the patient contact the pharmacy for the refill.)   Preferred Pharmacy (with phone number or street name): [facility] [address]   Agent: Please be advised that RX refills may take up to 3 business days. We ask that you follow-up with your pharmacy. On 04/16/17 [facility] only gave him a 30 day supply

## 2017-07-30 ENCOUNTER — Ambulatory Visit: Payer: 59 | Admitting: Primary Care

## 2017-09-22 ENCOUNTER — Ambulatory Visit (INDEPENDENT_AMBULATORY_CARE_PROVIDER_SITE_OTHER): Payer: 59 | Admitting: Primary Care

## 2017-09-22 ENCOUNTER — Encounter: Payer: Self-pay | Admitting: Primary Care

## 2017-09-22 VITALS — BP 126/80 | HR 61 | Temp 98.3°F | Ht 65.75 in | Wt 261.5 lb

## 2017-09-22 DIAGNOSIS — Z114 Encounter for screening for human immunodeficiency virus [HIV]: Secondary | ICD-10-CM | POA: Diagnosis not present

## 2017-09-22 DIAGNOSIS — R3915 Urgency of urination: Secondary | ICD-10-CM | POA: Diagnosis not present

## 2017-09-22 DIAGNOSIS — I10 Essential (primary) hypertension: Secondary | ICD-10-CM | POA: Diagnosis not present

## 2017-09-22 DIAGNOSIS — Z1211 Encounter for screening for malignant neoplasm of colon: Secondary | ICD-10-CM

## 2017-09-22 DIAGNOSIS — Z0000 Encounter for general adult medical examination without abnormal findings: Secondary | ICD-10-CM

## 2017-09-22 DIAGNOSIS — E119 Type 2 diabetes mellitus without complications: Secondary | ICD-10-CM

## 2017-09-22 DIAGNOSIS — Z1159 Encounter for screening for other viral diseases: Secondary | ICD-10-CM

## 2017-09-22 DIAGNOSIS — F320 Major depressive disorder, single episode, mild: Secondary | ICD-10-CM

## 2017-09-22 LAB — COMPREHENSIVE METABOLIC PANEL
ALT: 16 U/L (ref 0–53)
AST: 18 U/L (ref 0–37)
Albumin: 4 g/dL (ref 3.5–5.2)
Alkaline Phosphatase: 58 U/L (ref 39–117)
BUN: 20 mg/dL (ref 6–23)
CO2: 27 meq/L (ref 19–32)
CREATININE: 0.67 mg/dL (ref 0.40–1.50)
Calcium: 9.1 mg/dL (ref 8.4–10.5)
Chloride: 103 mEq/L (ref 96–112)
GFR: 126.73 mL/min (ref >60.00)
GLUCOSE: 141 mg/dL — AB (ref 70–99)
Potassium: 4 mEq/L (ref 3.5–5.1)
Sodium: 137 mEq/L (ref 135–145)
TOTAL PROTEIN: 7.6 g/dL (ref 6.0–8.3)
Total Bilirubin: 0.6 mg/dL (ref 0.2–1.2)

## 2017-09-22 LAB — LIPID PANEL
Cholesterol: 164 mg/dL (ref 0–200)
HDL: 51 mg/dL (ref >39.00)
LDL Cholesterol: 95 mg/dL (ref 0–99)
NonHDL: 113.32
Total CHOL/HDL Ratio: 3
Triglycerides: 90 mg/dL (ref 0.0–149.0)
VLDL: 18 mg/dL (ref 0.0–40.0)

## 2017-09-22 LAB — HEMOGLOBIN A1C: Hgb A1c MFr Bld: 6.5 % (ref 4.6–6.5)

## 2017-09-22 NOTE — Assessment & Plan Note (Signed)
Repeat A1C pending today. Declines pneumonia vaccination. Eye exam UTD.  Managed on statin and ACE.  Consider reducing dose of glipizide or changing to XL version. Await A1C. Discussed the importance of a healthy diet and regular exercise in order for weight loss, and to reduce the risk of any potential medical problems.  Follow up in 6 months.

## 2017-09-22 NOTE — Assessment & Plan Note (Signed)
Stable in the office today, continue lisinopril 10 mg. BMP pending today.

## 2017-09-22 NOTE — Assessment & Plan Note (Signed)
Declines Pneumonia and Shingles vaccinations. Td UTD. Colonoscopy due, referral placed. PSA UTD. Discussed the importance of a healthy diet and regular exercise in order for weight loss, and to reduce the risk of any potential medical problems. Exam unremarkable. Labs pending. Follow up in 1 year for CPE.

## 2017-09-22 NOTE — Patient Instructions (Signed)
Stop by the lab prior to leaving today. I will notify you of your results once received.   Start exercising. You should be getting 150 minutes of moderate intensity exercise weekly.  It is important that you improve your diet. Please limit carbohydrates in the form of white bread, rice, pasta, sweets, fast food, fried food, sugary drinks, etc. Increase your consumption of fresh fruits and vegetables, whole grains, lean protein.  Ensure you are consuming 64 ounces of water daily.  You will be contacted regarding your referral to GI for the colonoscopy.  Please let us know if you have not been contacted within one week.   Please schedule a 30 minute follow up appointment in 6 months for diabetes check.   It was a pleasure to see you today!   Diabetes Mellitus and Nutrition When you have diabetes (diabetes mellitus), it is very important to have healthy eating habits because your blood sugar (glucose) levels are greatly affected by what you eat and drink. Eating healthy foods in the appropriate amounts, at about the same times every day, can help you:  Control your blood glucose.  Lower your risk of heart disease.  Improve your blood pressure.  Reach or maintain a healthy weight.  Every person with diabetes is different, and each person has different needs for a meal plan. Your health care provider may recommend that you work with a diet and nutrition specialist (dietitian) to make a meal plan that is best for you. Your meal plan may vary depending on factors such as:  The calories you need.  The medicines you take.  Your weight.  Your blood glucose, blood pressure, and cholesterol levels.  Your activity level.  Other health conditions you have, such as heart or kidney disease.  How do carbohydrates affect me? Carbohydrates affect your blood glucose level more than any other type of food. Eating carbohydrates naturally increases the amount of glucose in your blood. Carbohydrate  counting is a method for keeping track of how many carbohydrates you eat. Counting carbohydrates is important to keep your blood glucose at a healthy level, especially if you use insulin or take certain oral diabetes medicines. It is important to know how many carbohydrates you can safely have in each meal. This is different for every person. Your dietitian can help you calculate how many carbohydrates you should have at each meal and for snack. Foods that contain carbohydrates include:  Bread, cereal, rice, pasta, and crackers.  Potatoes and corn.  Peas, beans, and lentils.  Milk and yogurt.  Fruit and juice.  Desserts, such as cakes, cookies, ice cream, and candy.  How does alcohol affect me? Alcohol can cause a sudden decrease in blood glucose (hypoglycemia), especially if you use insulin or take certain oral diabetes medicines. Hypoglycemia can be a life-threatening condition. Symptoms of hypoglycemia (sleepiness, dizziness, and confusion) are similar to symptoms of having too much alcohol. If your health care provider says that alcohol is safe for you, follow these guidelines:  Limit alcohol intake to no more than 1 drink per day for nonpregnant women and 2 drinks per day for men. One drink equals 12 oz of beer, 5 oz of wine, or 1 oz of hard liquor.  Do not drink on an empty stomach.  Keep yourself hydrated with water, diet soda, or unsweetened iced tea.  Keep in mind that regular soda, juice, and other mixers may contain a lot of sugar and must be counted as carbohydrates.  What are tips  for following this plan? Reading food labels  Start by checking the serving size on the label. The amount of calories, carbohydrates, fats, and other nutrients listed on the label are based on one serving of the food. Many foods contain more than one serving per package.  Check the total grams (g) of carbohydrates in one serving. You can calculate the number of servings of carbohydrates in one  serving by dividing the total carbohydrates by 15. For example, if a food has 30 g of total carbohydrates, it would be equal to 2 servings of carbohydrates.  Check the number of grams (g) of saturated and trans fats in one serving. Choose foods that have low or no amount of these fats.  Check the number of milligrams (mg) of sodium in one serving. Most people should limit total sodium intake to less than 2,300 mg per day.  Always check the nutrition information of foods labeled as "low-fat" or "nonfat". These foods may be higher in added sugar or refined carbohydrates and should be avoided.  Talk to your dietitian to identify your daily goals for nutrients listed on the label. Shopping  Avoid buying canned, premade, or processed foods. These foods tend to be high in fat, sodium, and added sugar.  Shop around the outside edge of the grocery store. This includes fresh fruits and vegetables, bulk grains, fresh meats, and fresh dairy. Cooking  Use low-heat cooking methods, such as baking, instead of high-heat cooking methods like deep frying.  Cook using healthy oils, such as olive, canola, or sunflower oil.  Avoid cooking with butter, cream, or high-fat meats. Meal planning  Eat meals and snacks regularly, preferably at the same times every day. Avoid going long periods of time without eating.  Eat foods high in fiber, such as fresh fruits, vegetables, beans, and whole grains. Talk to your dietitian about how many servings of carbohydrates you can eat at each meal.  Eat 4-6 ounces of lean protein each day, such as lean meat, chicken, fish, eggs, or tofu. 1 ounce is equal to 1 ounce of meat, chicken, or fish, 1 egg, or 1/4 cup of tofu.  Eat some foods each day that contain healthy fats, such as avocado, nuts, seeds, and fish. Lifestyle   Check your blood glucose regularly.  Exercise at least 30 minutes 5 or more days each week, or as told by your health care provider.  Take medicines  as told by your health care provider.  Do not use any products that contain nicotine or tobacco, such as cigarettes and e-cigarettes. If you need help quitting, ask your health care provider.  Work with a Social worker or diabetes educator to identify strategies to manage stress and any emotional and social challenges. What are some questions to ask my health care provider?  Do I need to meet with a diabetes educator?  Do I need to meet with a dietitian?  What number can I call if I have questions?  When are the best times to check my blood glucose? Where to find more information:  American Diabetes Association: diabetes.org/food-and-fitness/food  Academy of Nutrition and Dietetics: PokerClues.dk  [employer] of Diabetes and Digestive and Kidney Diseases (NIH): ContactWire.be Summary  A healthy meal plan will help you control your blood glucose and maintain a healthy lifestyle.  Working with a diet and nutrition specialist (dietitian) can help you make a meal plan that is best for you.  Keep in mind that carbohydrates and alcohol have immediate effects on your  blood glucose levels. It is important to count carbohydrates and to use alcohol carefully. This information is not intended to replace advice given to you by your health care provider. Make sure you discuss any questions you have with your health care provider. Document Released: 01/09/2005 Document Revised: 05/19/2016 Document Reviewed: 05/19/2016 Elsevier Interactive Patient Education  [employer].

## 2017-09-22 NOTE — Assessment & Plan Note (Addendum)
Improved urinary urgency but having difficulty with urine outlet. He does experience dribbling of urine several times weekly. Offered to add in Proscar vs Urology referral, he would like to monitor symptoms as they are not quite bothersome.   He will update if symptoms progress.

## 2017-09-22 NOTE — Assessment & Plan Note (Signed)
Overall doing well on Zoloft, continue same. Denies SI/HI.

## 2017-09-22 NOTE — Progress Notes (Signed)
Subjective:    Patient ID: Eric Hill, male    DOB: 1950/10/15, 67 y.o.   MRN: [id number]  HPI  Eric Hill is a 67 year old male who presents today for complete physical.  He's been taking Glipizide 10 mg every morning and 5 mg HS as he's seen a few glucose numbers in the 60's. He's checking his blood sugars 1-3 times daily. He's noticed an increase in readings with soda and fast food consumption.  He's checking his glucose readings mostly AM Fasting: 130's-170's.   He has noticed intermittent dribbling with urine. His urgency has improved but will have some difficulty with urine outlet. PSA from 2018 with reading of 0.7.  Immunizations: -Tetanus: Completed in 2017 -Influenza: Did not completed last season -Pneumonia: Declines -Shingles: Declines  Diet: Breakfast: Bacon, egg, cheese sandwich (fast food) Lunch: [employer], chips Dinner: [employer], fast food Snacks: Candy bar, chips Desserts: Daily  Beverages: Water, 10 ounces of soda, sweet tea   Exercise: He is not exercising Eye exam: Completed in April 2019 Dental exam: No recent exam Colonoscopy: Declines due to insurance.  PSA: Completed in 2018 Hep C Screen: Due  BP Readings from Last 3 Encounters:  09/22/17 126/80  03/24/17 120/78  09/17/16 140/74      Review of Systems  Constitutional: Negative for unexpected weight change.  HENT: Negative for rhinorrhea.   Respiratory: Negative for cough and shortness of breath.   Cardiovascular: Negative for chest pain.  Gastrointestinal: Negative for constipation and diarrhea.  Genitourinary: Positive for difficulty urinating. Negative for urgency.  Musculoskeletal: Negative for arthralgias and myalgias.  Skin: Negative for rash.  Allergic/Immunologic: Negative for environmental allergies.  Neurological: Negative for dizziness, numbness and headaches.  Psychiatric/Behavioral: The patient is not nervous/anxious.        Vivid dreams over the last 2 weeks.       Past Medical History:  Diagnosis Date  . Diverticulitis   . Erectile dysfunction   . History of depression   . Type 2 diabetes mellitus ([place])      Social History   Socioeconomic History  . Marital status: Single    Spouse name: Not on file  . Number of children: Not on file  . Years of education: Not on file  . Highest education level: Not on file  Occupational History  . Not on file  Social Needs  . Financial resource strain: Not on file  . Food insecurity:    Worry: Not on file    Inability: Not on file  . Transportation needs:    Medical: Not on file    Non-medical: Not on file  Tobacco Use  . Smoking status: Former Research scientist (life sciences)  . Smokeless tobacco: Never Used  Substance and Sexual Activity  . Alcohol use: Yes    Alcohol/week: 0.0 oz    Comment: rarely  . Drug use: No  . Sexual activity: Not on file  Lifestyle  . Physical activity:    Days per week: Not on file    Minutes per session: Not on file  . Stress: Not on file  Relationships  . Social connections:    Talks on phone: Not on file    Gets together: Not on file    Attends religious service: Not on file    Active member of club or organization: Not on file    Attends meetings of clubs or organizations: Not on file    Relationship status: Not on file  . Intimate partner violence:  Fear of current or ex partner: Not on file    Emotionally abused: Not on file    Physically abused: Not on file    Forced sexual activity: Not on file  Other Topics Concern  . Not on file  Social History Narrative   Single   Works as a Mudlogger.   No children   Lives in [place]   Enjoys racing cars, Designer, fashion/clothing.    No past surgical history on file.  Family History  Problem Relation Age of Onset  . Neuropathy Mother   . Cancer Sister        Back, brain  . Cancer Maternal Grandfather        Abdominal     No Known Allergies  Current Outpatient Medications on File Prior to Visit  Medication Sig  Dispense Refill  . atorvastatin (LIPITOR) 20 MG tablet Take 1 tablet (20 mg total) by mouth daily. 90 tablet 3  . blood glucose meter kit and supplies KIT Dispense based on patient and insurance preference. Use up to 2-3 times daily as directed. Dx is E11.9 1 each 0  . glipiZIDE (GLUCOTROL) 10 MG tablet TAKE 1 TABLET BY MOUTH TWICE DAILY BEFORE A MEAL 180 tablet 1  . lisinopril (PRINIVIL,ZESTRIL) 10 MG tablet Take 1 tablet (10 mg total) by mouth daily. 90 tablet 1  . sertraline (ZOLOFT) 50 MG tablet Take 1 tablet (50 mg total) by mouth daily. 30 tablet 2  . sildenafil (REVATIO) 20 MG tablet Take 1-3 tablets by mouth as needed for erectile dysfunction. 50 tablet 0  . tamsulosin (FLOMAX) 0.4 MG CAPS capsule TAKE 1 CAPSULE BY MOUTH ONCE DAILY WITH BREAKFAST FOR URINE FLOW 90 capsule 0   No current facility-administered medications on file prior to visit.     BP 126/80   Pulse 61   Temp 98.3 F (36.8 C) (Oral)   Ht 5' 5.75" (1.67 m)   Wt 261 lb 8 oz (118.6 kg)   SpO2 97%   BMI 42.53 kg/m    Objective:   Physical Exam  Constitutional: He is oriented to person, place, and time. He appears well-nourished.  HENT:  Mouth/Throat: No oropharyngeal exudate.  Eyes: Pupils are equal, round, and reactive to light. EOM are normal.  Neck: Neck supple. No thyromegaly present.  Cardiovascular: Normal rate and regular rhythm.  Respiratory: Effort normal and breath sounds normal.  GI: Soft. Bowel sounds are normal. There is no tenderness.  Musculoskeletal: Normal range of motion.  Neurological: He is alert and oriented to person, place, and time.  Skin: Skin is warm and dry.  Psychiatric: He has a normal mood and affect.           Assessment & Plan:

## 2017-09-23 LAB — HIV ANTIBODY (ROUTINE TESTING W REFLEX): HIV 1&2 Ab, 4th Generation: NONREACTIVE

## 2017-09-23 LAB — HEPATITIS C ANTIBODY
Hepatitis C Ab: NONREACTIVE
SIGNAL TO CUT-OFF: 0.2 (ref <1.00)

## 2017-09-25 ENCOUNTER — Other Ambulatory Visit: Payer: Self-pay | Admitting: Primary Care

## 2017-09-25 ENCOUNTER — Telehealth: Payer: Self-pay | Admitting: Primary Care

## 2017-09-25 DIAGNOSIS — E119 Type 2 diabetes mellitus without complications: Secondary | ICD-10-CM

## 2017-09-25 MED ORDER — GLIPIZIDE ER 10 MG PO TB24: ORAL_TABLET | ORAL | 3 refills | Status: DC

## 2017-09-25 NOTE — Telephone Encounter (Signed)
This encounter was created in error - please disregard.

## 2017-09-25 NOTE — Telephone Encounter (Signed)
Copied from [place] [id number]. Topic: Quick Communication - Lab Results >> Sep 25, 2017  2:24 PM Jacqualin Combes, CMA wrote: Called patient and left message regarding lab results. When patient returns call, triage nurse may disclose results.

## 2017-09-28 NOTE — Telephone Encounter (Signed)
Patient notified by [facility] 

## 2017-10-14 ENCOUNTER — Other Ambulatory Visit: Payer: Self-pay | Admitting: Primary Care

## 2017-10-14 DIAGNOSIS — F320 Major depressive disorder, single episode, mild: Secondary | ICD-10-CM

## 2017-10-14 NOTE — Telephone Encounter (Signed)
Ok to refill? Electronically refill request for sertraline (ZOLOFT) 50 MG tablet  Last prescribed on 04/16/2017  Last seen on 09/22/2017

## 2017-10-14 NOTE — Telephone Encounter (Signed)
Noted, refill sent to pharmacy. 

## 2017-11-04 ENCOUNTER — Telehealth: Payer: Self-pay

## 2017-11-04 NOTE — Telephone Encounter (Signed)
Patient called back and states that he checked with his insurance about colonoscopy coverage and called Dr [facility] office. Patient is in the system with Dr [facility] office and their office is going to reach to patient for an appointment.-Ran Tullis V Zykee Avakian, RMA

## 2017-12-14 ENCOUNTER — Other Ambulatory Visit: Payer: Self-pay | Admitting: Primary Care

## 2017-12-14 DIAGNOSIS — R3915 Urgency of urination: Secondary | ICD-10-CM

## 2017-12-14 MED ORDER — TAMSULOSIN HCL 0.4 MG PO CAPS: ORAL_CAPSULE | ORAL | 0 refills | Status: DC

## 2017-12-14 NOTE — Addendum Note (Signed)
Addended by: Jacqualin Combes on: 12/14/2017 11:21 AM   Modules accepted: Orders

## 2017-12-14 NOTE — Addendum Note (Signed)
Addended by: Jacqualin Combes on: 12/14/2017 11:20 AM   Modules accepted: Orders

## 2017-12-17 ENCOUNTER — Ambulatory Visit (INDEPENDENT_AMBULATORY_CARE_PROVIDER_SITE_OTHER): Payer: 59 | Admitting: Internal Medicine

## 2017-12-17 ENCOUNTER — Encounter: Payer: Self-pay | Admitting: Internal Medicine

## 2017-12-17 VITALS — BP 130/78 | HR 76 | Temp 98.7°F | Wt 258.0 lb

## 2017-12-17 DIAGNOSIS — J069 Acute upper respiratory infection, unspecified: Secondary | ICD-10-CM

## 2017-12-17 MED ORDER — AMOXICILLIN 875 MG PO TABS: 875.0000 mg | ORAL_TABLET | Freq: Two times a day (BID) | ORAL | 0 refills | Status: DC

## 2017-12-17 NOTE — Patient Instructions (Signed)
Upper Respiratory Infection, Adult Most upper respiratory infections (URIs) are caused by a virus. A URI affects the nose, throat, and upper air passages. The most common type of URI is often called "the common cold." Follow these instructions at home:  Take medicines only as told by your doctor.  Gargle warm saltwater or take cough drops to comfort your throat as told by your doctor.  Use a warm mist humidifier or inhale steam from a shower to increase air moisture. This may make it easier to breathe.  Drink enough fluid to keep your pee (urine) clear or pale yellow.  Eat soups and other clear broths.  Have a healthy diet.  Rest as needed.  Go back to work when your fever is gone or your doctor says it is okay. ? You may need to stay home longer to avoid giving your URI to others. ? You can also wear a face mask and wash your hands often to prevent spread of the virus.  Use your inhaler more if you have asthma.  Do not use any tobacco products, including cigarettes, chewing tobacco, or electronic cigarettes. If you need help quitting, ask your doctor. Contact a doctor if:  You are getting worse, not better.  Your symptoms are not helped by medicine.  You have chills.  You are getting more short of breath.  You have brown or red mucus.  You have yellow or brown discharge from your nose.  You have pain in your face, especially when you bend forward.  You have a fever.  You have puffy (swollen) neck glands.  You have pain while swallowing.  You have white areas in the back of your throat. Get help right away if:  You have very bad or constant: ? Headache. ? Ear pain. ? Pain in your forehead, behind your eyes, and over your cheekbones (sinus pain). ? Chest pain.  You have long-lasting (chronic) lung disease and any of the following: ? Wheezing. ? Long-lasting cough. ? Coughing up blood. ? A change in your usual mucus.  You have a stiff neck.  You have  changes in your: ? Vision. ? Hearing. ? Thinking. ? Mood. This information is not intended to replace advice given to you by your health care provider. Make sure you discuss any questions you have with your health care provider. Document Released: 10/01/2007 Document Revised: 12/16/2015 Document Reviewed: 07/20/2013 Elsevier Interactive Patient Education  2018 Elsevier Inc.  

## 2017-12-17 NOTE — Progress Notes (Signed)
HPI  Pt presents to the clinic today with c/o headache, ear fullness, nasal congestion, post nasal drip and cough. This started 1-2 weeks ago. He describes the pain as pressure, located in the forehead. She denies ear pain or decreased hearing. He is blowing yellow mucous out of his nose. He denies difficulty swallowing. The cough is productive of green mucous. He denies fever, but has had chills. He has tried Mucinex and cough syrup with minimal relief. He has a history of allergies but denies breathing problems, smoking history. He has had sick contacts at work.   Review of Systems      Past Medical History:  Diagnosis Date  . Diverticulitis   . Erectile dysfunction   . History of depression   . Type 2 diabetes mellitus ([facility])     Family History  Problem Relation Age of Onset  . Neuropathy Mother   . Cancer Sister        Back, brain  . Cancer Maternal Grandfather        Abdominal     Social History   Socioeconomic History  . Marital status: Single    Spouse name: Not on file  . Number of children: Not on file  . Years of education: Not on file  . Highest education level: Not on file  Occupational History  . Not on file  Social Needs  . Financial resource strain: Not on file  . Food insecurity:    Worry: Not on file    Inability: Not on file  . Transportation needs:    Medical: Not on file    Non-medical: Not on file  Tobacco Use  . Smoking status: Former Research scientist (life sciences)  . Smokeless tobacco: Never Used  Substance and Sexual Activity  . Alcohol use: Yes    Alcohol/week: 0.0 standard drinks    Comment: rarely  . Drug use: No  . Sexual activity: Not on file  Lifestyle  . Physical activity:    Days per week: Not on file    Minutes per session: Not on file  . Stress: Not on file  Relationships  . Social connections:    Talks on phone: Not on file    Gets together: Not on file    Attends religious service: Not on file    Active member of club or organization: Not on  file    Attends meetings of clubs or organizations: Not on file    Relationship status: Not on file  . Intimate partner violence:    Fear of current or ex partner: Not on file    Emotionally abused: Not on file    Physically abused: Not on file    Forced sexual activity: Not on file  Other Topics Concern  . Not on file  Social History Narrative   Single   Works as a Mudlogger.   No children   Lives in [place]   Enjoys racing cars, Designer, fashion/clothing.    No Known Allergies   Constitutional: Positive headache. Denies fatigue, fever or abrupt weight changes.  HEENT:  Positive ear fullness, nasal congestion, post nasal drip. Denies eye redness, eye pain, pressure behind the eyes, facial pain, ear pain, ringing in the ears, wax buildup, runny nose or bloody nose. Respiratory: Positive cough. Denies difficulty breathing or shortness of breath.  Cardiovascular: Denies chest pain, chest tightness, palpitations or swelling in the hands or feet.   No other specific complaints in a complete review of systems (except as listed  in HPI above).  Objective:   BP 130/78   Pulse 76   Temp 98.7 F (37.1 C) (Oral)   Wt 258 lb (117 kg)   SpO2 96%   BMI 41.96 kg/m  Wt Readings from Last 3 Encounters:  12/17/17 258 lb (117 kg)  09/22/17 261 lb 8 oz (118.6 kg)  03/24/17 254 lb 12.8 oz (115.6 kg)     General: Appears his stated age, in NAD. HEENT: Head: normal shape and size, no sinus pressure noted; Ears: Tm's pink but intact, normal light reflex, + serous effusion on the right; Nose: mucosa pink and moist, septum midline; Throat/Mouth: + PND. Teeth present, mucosa erythematous and moist, no exudate noted, no lesions or ulcerations noted.  Neck: No cervical lymphadenopathy.  Pulmonary/Chest: Normal effort and positive vesicular breath sounds. No respiratory distress. No wheezes, rales or ronchi noted.       Assessment & Plan:   Upper Respiratory Infection:  Get some rest and  drink plenty of water Start Zyrtec and Flonase OTC eRx for Amoxil 875 mg BID x 10 days Delsym/Robitussin/Nyquil as needed for cough  RTC as needed or if symptoms persist.   Webb Silversmith, NP

## 2018-01-12 ENCOUNTER — Other Ambulatory Visit: Payer: Self-pay | Admitting: Primary Care

## 2018-01-12 DIAGNOSIS — I10 Essential (primary) hypertension: Secondary | ICD-10-CM

## 2018-01-20 ENCOUNTER — Ambulatory Visit: Payer: Self-pay

## 2018-01-20 NOTE — Telephone Encounter (Signed)
Pt. called with c/o intermittent heart palpitations over past week.  Stated the episodes come and go, and can last up to about "half a day", once it starts.  Denied any chest pain, shortness of breath, dizziness, or sweating, associated with the palpitations.  Pulse checked x 60 seconds; pt. reported pulse of 80 BPM and regular, during Triage call.  Reported that he experienced irreg. heart beat about 5 yrs. Ago, and "the Cardiologist thought it was Atrial Fib, but it went away on its own."  Pt. denies use of Caffeine.  Denies use of alcohol.  Denied any symptoms at this time.  Appt. scheduled for 01/22/18 with PCP; care advice given per protocol.  Verb. Understanding.     Reason for Disposition . [1] Palpitations AND [2] no improvement after using CARE ADVICE  Answer Assessment - Initial Assessment Questions 1. DESCRIPTION: "Please describe your heart rate or heart beat that you are having" (e.g., fast/slow, regular/irregular, skipped or extra beats, "palpitations")     Feels intermittent irregular heart beats 2. ONSET: "When did it start?" (Minutes, hours or days)      About one week 3. DURATION: "How long does it last" (e.g., seconds, minutes, hours)     It lasts about a half a day when it starts 4. PATTERN "Does it come and go, or has it been constant since it started?"  "Does it get worse with exertion?"   "Are you feeling it now?"     Comes and goes; denied the symptoms at this time  5. TAP: "Using your hand, can you tap out what you are feeling on a chair or table in front of you, so that I can hear?" (Note: not all patients can do this)       Did not request 6. HEART RATE: "Can you tell me your heart rate?" "How many beats in 15 seconds?"  (Note: not all patients can do this)      Pulse 80 bpm; no irreg. At 3:00 PM 7. RECURRENT SYMPTOM: "Have you ever had this before?" If so, ask: "When was the last time?" and "What happened that time?"      About 5 yrs ago; was evaluated and presumed to  have A-Fib.; it went away without treatment 8. CAUSE: "What do you think is causing the palpitations?"     Unknown; doesn't drink caffeine, sleep is better on Zoloft  9. CARDIAC HISTORY: "Do you have any history of heart disease?" (e.g., heart attack, angina, bypass surgery, angioplasty, arrhythmia)      Denied  10. OTHER SYMPTOMS: "Do you have any other symptoms?" (e.g., dizziness, chest pain, sweating, difficulty breathing)       Denied chest pain, dizziness, SOB, or sweating  Protocols used: HEART RATE AND HEARTBEAT QUESTIONS-A-AH

## 2018-01-22 ENCOUNTER — Ambulatory Visit: Payer: 59 | Admitting: Primary Care

## 2018-01-22 ENCOUNTER — Encounter: Payer: Self-pay | Admitting: Primary Care

## 2018-01-22 VITALS — BP 130/70 | HR 77 | Temp 98.0°F | Ht 65.75 in | Wt 258.5 lb

## 2018-01-22 DIAGNOSIS — R002 Palpitations: Secondary | ICD-10-CM | POA: Diagnosis not present

## 2018-01-22 DIAGNOSIS — E119 Type 2 diabetes mellitus without complications: Secondary | ICD-10-CM

## 2018-01-22 LAB — BASIC METABOLIC PANEL
BUN: 25 mg/dL — AB (ref 6–23)
CALCIUM: 8.8 mg/dL (ref 8.4–10.5)
CO2: 28 mEq/L (ref 19–32)
Chloride: 104 mEq/L (ref 96–112)
Creatinine, Ser: 0.74 mg/dL (ref 0.40–1.50)
GFR: 112.88 mL/min (ref >60.00)
Glucose, Bld: 184 mg/dL — ABNORMAL HIGH (ref 70–99)
Potassium: 3.8 mEq/L (ref 3.5–5.1)
SODIUM: 139 meq/L (ref 135–145)

## 2018-01-22 LAB — CBC
HEMATOCRIT: 43.6 % (ref 39.0–52.0)
HEMOGLOBIN: 15 g/dL (ref 13.0–17.0)
MCHC: 34.3 g/dL (ref 30.0–36.0)
MCV: 88.7 fl (ref 78.0–100.0)
Platelets: 251 10*3/uL (ref 150.0–400.0)
RBC: 4.92 Mil/uL (ref 4.22–5.81)
RDW: 13.3 % (ref 11.5–15.5)
WBC: 7.4 10*3/uL (ref 4.0–10.5)

## 2018-01-22 LAB — HEMOGLOBIN A1C: HEMOGLOBIN A1C: 6.5 % (ref 4.6–6.5)

## 2018-01-22 LAB — TSH: TSH: 1.74 u[IU]/mL (ref 0.35–4.50)

## 2018-01-22 NOTE — Assessment & Plan Note (Signed)
Intermittent for years, more frequent recently. ECG today with NSR with rate of 69. No t-wave inversion, ST elevation/depression, good R-wave progression. No ECG in the system to compare. Check labs including TSH, CBC, A1C, BMP. Referral placed to cardiology given duration of intermittent symptoms and overall risk for cardiac event.

## 2018-01-22 NOTE — Progress Notes (Signed)
Subjective:    Patient ID: Eric Hill, male    DOB: 1950-10-05, 67 y.o.   MRN: [id number]  HPI  Mr. Rayos is a 67 year old male with a history of type 2 diabetes, hypertension, morbid obesity who presents today with a chief complaint of palpitations.  BP Readings from Last 3 Encounters:  01/22/18 130/70  12/17/17 130/78  09/22/17 126/80   He's noticed daily, intermittent palpitations for the last two weeks. He had an episode of the same 6 years ago, evaluated by cardiology who completed ECG and what sounds like echocardiogram. He's not sure if he was told he had atrial fibrillation. He's had infrequent episodes of palpitations over the last 6 years that will last 1-2 days and are not as bad or frequent.   He denies chest pain, shortness of breath, dizziness, esophageal burning/reflux. He notices his symptoms of palpitations mostly after eating or drinking. He did think his symptoms were secondary to caffeine so he started reducing caffeine and hasn't noticed improvement. He has had three episodes of dizziness about one month ago that lasted several minutes. He has been under increased stress at work lately. He endorses "normal blood sugar readings" and is compliant to his diabetes regimen.   Review of Systems  Constitutional: Negative for fatigue and unexpected weight change.  HENT: Negative for congestion.   Respiratory: Negative for cough.   Cardiovascular: Positive for palpitations. Negative for chest pain.  Neurological: Negative for dizziness and headaches.  Psychiatric/Behavioral:       Increased stress at work       Past Medical History:  Diagnosis Date  . Diverticulitis   . Erectile dysfunction   . History of depression   . Type 2 diabetes mellitus ([place])      Social History   Socioeconomic History  . Marital status: Single    Spouse name: Not on file  . Number of children: Not on file  . Years of education: Not on file  . Highest education level: Not  on file  Occupational History  . Not on file  Social Needs  . Financial resource strain: Not on file  . Food insecurity:    Worry: Not on file    Inability: Not on file  . Transportation needs:    Medical: Not on file    Non-medical: Not on file  Tobacco Use  . Smoking status: Former Research scientist (life sciences)  . Smokeless tobacco: Never Used  Substance and Sexual Activity  . Alcohol use: Yes    Alcohol/week: 0.0 standard drinks    Comment: rarely  . Drug use: No  . Sexual activity: Not on file  Lifestyle  . Physical activity:    Days per week: Not on file    Minutes per session: Not on file  . Stress: Not on file  Relationships  . Social connections:    Talks on phone: Not on file    Gets together: Not on file    Attends religious service: Not on file    Active member of club or organization: Not on file    Attends meetings of clubs or organizations: Not on file    Relationship status: Not on file  . Intimate partner violence:    Fear of current or ex partner: Not on file    Emotionally abused: Not on file    Physically abused: Not on file    Forced sexual activity: Not on file  Other Topics Concern  . Not on file  Social History Narrative   Single   Works as a Mudlogger.   No children   Lives in [place]   Enjoys racing cars, Designer, fashion/clothing.    No past surgical history on file.  Family History  Problem Relation Age of Onset  . Neuropathy Mother   . Cancer Sister        Back, brain  . Cancer Maternal Grandfather        Abdominal     No Known Allergies  Current Outpatient Medications on File Prior to Visit  Medication Sig Dispense Refill  . atorvastatin (LIPITOR) 20 MG tablet Take 1 tablet (20 mg total) by mouth daily. 90 tablet 3  . blood glucose meter kit and supplies KIT Dispense based on patient and insurance preference. Use up to 2-3 times daily as directed. Dx is E11.9 1 each 0  . glipiZIDE (GLUCOTROL XL) 10 MG 24 hr tablet Take 1 tablet by mouth every  morning with food for diabetes. 90 tablet 3  . lisinopril (PRINIVIL,ZESTRIL) 10 MG tablet TAKE 1 TABLET BY MOUTH ONCE DAILY 90 tablet 1  . sertraline (ZOLOFT) 50 MG tablet TAKE 1 TABLET BY MOUTH ONCE DAILY 90 tablet 3  . sildenafil (REVATIO) 20 MG tablet Take 1-3 tablets by mouth as needed for erectile dysfunction. 50 tablet 0  . tamsulosin (FLOMAX) 0.4 MG CAPS capsule TAKE 1 CAPSULE BY MOUTH ONCE DAILY WITH BREAKFAST FOR URINE FLOW 90 capsule 0   No current facility-administered medications on file prior to visit.     BP 130/70   Pulse 77   Temp 98 F (36.7 C) (Oral)   Ht 5' 5.75" (1.67 m)   Wt 258 lb 8 oz (117.3 kg)   SpO2 97%   BMI 42.04 kg/m    Objective:   Physical Exam  Constitutional: He appears well-nourished.  Neck: Neck supple.  Cardiovascular: Normal rate and regular rhythm.  Murmur heard. Respiratory: Effort normal and breath sounds normal.  Skin: Skin is warm and dry.  Psychiatric: He has a normal mood and affect.           Assessment & Plan:

## 2018-01-22 NOTE — Patient Instructions (Addendum)
Stop by the lab prior to leaving today. I will notify you of your results once received.   You will be contacted regarding your referral to .  Please let us know if you have not been contacted within one week.   Please do to the hospital if you develop chest pain, shortness of breath, you break out in sweats, consistent dizziness.   It was a pleasure to see you today!

## 2018-01-25 ENCOUNTER — Ambulatory Visit: Payer: 59 | Admitting: Cardiovascular Disease

## 2018-01-25 ENCOUNTER — Encounter: Payer: Self-pay | Admitting: Cardiovascular Disease

## 2018-01-25 VITALS — BP 134/68 | HR 68 | Ht 65.0 in | Wt 255.0 lb

## 2018-01-25 DIAGNOSIS — R002 Palpitations: Secondary | ICD-10-CM | POA: Diagnosis not present

## 2018-01-25 DIAGNOSIS — I10 Essential (primary) hypertension: Secondary | ICD-10-CM | POA: Diagnosis not present

## 2018-01-25 DIAGNOSIS — E118 Type 2 diabetes mellitus with unspecified complications: Secondary | ICD-10-CM | POA: Diagnosis not present

## 2018-01-25 MED ORDER — METOPROLOL TARTRATE 25 MG PO TABS: 25.0000 mg | ORAL_TABLET | ORAL | 3 refills | Status: DC | PRN

## 2018-01-25 NOTE — Patient Instructions (Addendum)
Medication Instructions:  Take metoprolol tartrate (Lopressor) 25 mg AS NEEDED for palpitations  Testing/Procedures: Your physician has requested that you have an echocardiogram. Echocardiography is a painless test that uses sound waves to create images of your heart. It provides your doctor with information about the size and shape of your heart and how well your heart's chambers and valves are working. This procedure takes approximately one hour. There are no restrictions for this procedure.  This will be done at our [facility] location:  [address] I reviewed his recent laboratory and hemoglobin A1c was 6.5.  TSH was 1.74. Your physician has recommended that you wear an event monitor (2 week). Event monitors are medical devices that record the heart's electrical activity. Doctors most often [place] these monitors to diagnose arrhythmias. Arrhythmias are problems with the speed or rhythm of the heartbeat. The monitor is a small, portable device. You can wear one while you do your normal daily activities. This is usually used to diagnose what is causing palpitations/syncope (passing out).  Follow-Up: 11/21 at 10:40 AM with Dr. Claiborne Billings  Any Other Special Instructions Will Be Listed Below (If Applicable).     If you need a refill on your cardiac medications before your next appointment, please call your pharmacy.

## 2018-01-25 NOTE — Progress Notes (Signed)
Cardiology Office Note    Date:  01/25/2018   ID:  Eric Hill, DOB [birth year], MRN [id number]  PCP:  Pleas [name], NP  Cardiologist:  Shelva Majestic, MD   New cardiology consultation referred through the courtesy of Alma Friendly, NP for evaluation of palpitation  History of Present Illness:  Eric Hill is a 67 y.o. male who presents for allergy consultation for evaluation of increasing episodes of palpitations.  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  He has noticed intermittent palpitations for 6 or 7 years and often these would seem to exacerbate during periods of increased stress.  Really when he had more help, the stress at work was less and palpitations improved.  He is now getting ready to retire.  For the last 6 months he has noticed more palpitations which occur more frequently and also for and also which occur for longer duration.  He denies any associated chest tightness or heaviness.  He was drinking 1 cup of 20 ounce caffeine per day but he is significantly stop caffeine use over the last several weeks.  He sleeps for approximately 5 hours per night.  He snores.  He denies daytime sleepiness.  He was recently seen by Loma [place] and with his history of hypertension, diabetes mellitus, morbid obesity with increasing palpitations he is now referred for cardiology consultation and evaluation.  Past Medical History:  Diagnosis Date  . Diverticulitis   . Erectile dysfunction   . History of depression   . Type 2 diabetes mellitus ([facility])    There is no prior surgery.  Current Medications: Outpatient Medications Prior to Visit  Medication Sig Dispense Refill  . blood glucose meter kit and supplies KIT Dispense based on patient and insurance preference. Use up to 2-3 times daily as directed. Dx is E11.9 1 each 0  . glipiZIDE (GLUCOTROL XL) 10 MG 24 hr tablet Take 1 tablet by mouth every morning with food for diabetes. 90 tablet 3  . lisinopril  (PRINIVIL,ZESTRIL) 10 MG tablet TAKE 1 TABLET BY MOUTH ONCE DAILY 90 tablet 1  . sertraline (ZOLOFT) 50 MG tablet TAKE 1 TABLET BY MOUTH ONCE DAILY 90 tablet 3  . tamsulosin (FLOMAX) 0.4 MG CAPS capsule TAKE 1 CAPSULE BY MOUTH ONCE DAILY WITH BREAKFAST FOR URINE FLOW 90 capsule 0  . atorvastatin (LIPITOR) 20 MG tablet Take 1 tablet (20 mg total) by mouth daily. 90 tablet 3  . sildenafil (REVATIO) 20 MG tablet Take 1-3 tablets by mouth as needed for erectile dysfunction. 50 tablet 0   No facility-administered medications prior to visit.      Allergies:   Patient has no known allergies.   Social History   Socioeconomic History  . Marital status: Single    Spouse name: Not on file  . Number of children: Not on file  . Years of education: Not on file  . Highest education level: Not on file  Occupational History  . Not on file  Social Needs  . Financial resource strain: Not on file  . Food insecurity:    Worry: Not on file    Inability: Not on file  . Transportation needs:    Medical: Not on file    Non-medical: Not on file  Tobacco Use  . Smoking status: Former Research scientist (life sciences)  . Smokeless tobacco: Never Used  Substance and Sexual Activity  . Alcohol use: Yes    Alcohol/week: 0.0 standard drinks    Comment: rarely  .  Drug use: No  . Sexual activity: Not on file  Lifestyle  . Physical activity:    Days per week: Not on file    Minutes per session: Not on file  . Stress: Not on file  Relationships  . Social connections:    Talks on phone: Not on file    Gets together: Not on file    Attends religious service: Not on file    Active member of club or organization: Not on file    Attends meetings of clubs or organizations: Not on file    Relationship status: Not on file  Other Topics Concern  . Not on file  Social History Narrative   Single   Works as a Mudlogger.   No children   Lives in [place]   Enjoys racing cars, Designer, fashion/clothing.    He is single and never  married.  He lives by himself.  He is a Holiday representative.  He had smoked cigarettes but quit in 1992.  He does not drink alcohol.  He does not exercise after his work.  Family History:  The patient's family history includes Cancer in his maternal grandfather and sister; Neuropathy in his mother.  Both parents are deceased.  His mother had heart problems and a stroke.  Father had Parkinson's disease.  ROS General: Negative; No fevers, chills, or night sweats;  HEENT: Negative; No changes in vision or hearing, sinus congestion, difficulty swallowing Pulmonary: Negative; No cough, wheezing, shortness of breath, hemoptysis Cardiovascular: Negative; No chest pain, presyncope, syncope, palpitations GI: Negative; No nausea, vomiting, diarrhea, or abdominal pain GU: Negative; No dysuria, hematuria, or difficulty voiding Musculoskeletal: Negative; no myalgias, joint pain, or weakness Hematologic/Oncology: Negative; no easy bruising, bleeding Endocrine: Negative; no heat/cold intolerance; no diabetes Neuro: Negative; no changes in balance, headaches Skin: Negative; No rashes or skin lesions Psychiatric: Negative; No behavioral problems, depression Sleep: Negative; No snoring, daytime sleepiness, hypersomnolence, bruxism, restless legs, hypnogognic hallucinations, no cataplexy Other comprehensive 14 point system review is negative.   PHYSICAL EXAM:   VS:  BP 134/68   Pulse 68   Ht 5' 5" (1.651 m)   Wt 255 lb (115.7 kg)   BMI 42.43 kg/m     Repeat blood pressure by me 146/70.  He tells me his blood pressure at home typically is in the 130s.  Wt Readings from Last 3 Encounters:  01/25/18 255 lb (115.7 kg)  01/22/18 258 lb 8 oz (117.3 kg)  12/17/17 258 lb (117 kg)    His peak weight was 270, he had reduced to 240 and he has regained some of the weight back and is now 255  General: Alert, oriented, no distress.  Morbidly obese. Skin: normal turgor, no rashes, warm and dry HEENT: Normocephalic,  atraumatic. Pupils equal round and reactive to light; sclera anicteric; extraocular muscles intact; Fundi without hemorrhages or exudates.  Discs flat Nose without nasal septal hypertrophy Mouth/Parynx benign; Mallinpatti scale 3 Neck: No JVD, no carotid bruits; normal carotid upstroke Lungs: clear to ausculatation and percussion; no wheezing or rales Chest wall: without tenderness to palpitation Heart: PMI not displaced, RRR, s1 s2 normal, 1/6 systolic murmur, no diastolic murmur, no rubs, gallops, thrills, or heaves Abdomen: soft, nontender; no hepatosplenomehaly, BS+; abdominal aorta nontender and not dilated by palpation. Back: no CVA tenderness Pulses 2+ Musculoskeletal: full range of motion, normal strength, no joint deformities Extremities: no clubbing cyanosis or edema, Homan's sign negative  Neurologic: grossly nonfocal; Cranial nerves grossly wnl  Psychologic: Normal mood and affect   Studies/Labs Reviewed:   EKG:  EKG is ordered today.  ECG (independently read by me): Normal sinus rhythm at 68 bpm.  No ectopy.  Normal intervals.  Recent Labs: BMP Latest Ref Rng & Units 01/22/2018 09/22/2017 09/15/2016  Glucose 70 - 99 mg/dL 184(H) 141(H) 151(H)  BUN 6 - 23 mg/dL 25(H) 20 21  Creatinine 0.40 - 1.50 mg/dL 0.74 0.67 0.84  Sodium 135 - 145 mEq/L 139 137 137  Potassium 3.5 - 5.1 mEq/L 3.8 4.0 4.3  Chloride 96 - 112 mEq/L 104 103 102  CO2 19 - 32 mEq/L _0 Calcium 8.4 - 10.5 mg/dL 8.8 9.1 9.4     Hepatic Function Latest Ref Rng & Units 09/22/2017 09/15/2016 12/18/2015  Total Protein 6.0 - 8.3 g/dL 7.6 7.7 7.4  Albumin 3.5 - 5.2 g/dL 4.0 4.1 4.1  AST 0 - 37 U/L _1 ALT 0 - 53 U/L _2 Alk Phosphatase 39 - 117 U/L 58 62 66  Total Bilirubin 0.2 - 1.2 mg/dL 0.6 0.9 0.4    CBC Latest Ref Rng & Units 01/22/2018 09/11/2014  WBC 4.0 - 10.5 K/uL 7.4 7.2  Hemoglobin 13.0 - 17.0 g/dL 15.0 15.5  Hematocrit 39.0 - 52.0 % 43.6 45.4  Platelets 150.0 - 400.0 K/uL 251.0  262.0   Lab Results  Component Value Date   MCV 88.7 01/22/2018   MCV 88.0 09/11/2014   Lab Results  Component Value Date   TSH 1.74 01/22/2018   Lab Results  Component Value Date   HGBA1C 6.5 01/22/2018     BNP No results found for: BNP  ProBNP No results found for: PROBNP   Lipid Panel     Component Value Date/Time   CHOL 164 09/22/2017 1000   TRIG 90.0 09/22/2017 1000   HDL 51.00 09/22/2017 1000   CHOLHDL 3 09/22/2017 1000   VLDL 18.0 09/22/2017 1000   LDLCALC 95 09/22/2017 1000   LDLDIRECT 109.0 12/18/2015 1523     RADIOLOGY: No results found.   Additional studies/ records that were reviewed today include:  I reviewed the records from Alma Friendly, NP    ASSESSMENT:    No diagnosis found.   PLAN:  Eric Hill is a 67 year old gentleman who has a history of hypertension, type 2 diabetes mellitus, morbid obesity, and recently has noticed increasing palpitations.  In the past he has had palpitations which were exacerbated by periods of increased stress.  I have recommended he undergo a 2D echo Doppler study for further evaluation of systolic and diastolic function.  I will schedule him to wear a 2-week event monitor for further evaluation of his rhythm disturbance and palpitations.  I will give him a prescription for metoprolol tartrate 25 mg initially to take on an as-needed basis.  He may require long-acting daily metoprolol but I will defer initiating this until he completes his Holter monitor study.  States his blood pressure at home is typically 130 or less on his current regimen of lisinopril 10 mg daily.  He is diabetic on glipizide.  He has a history of depression for which he takes Zoloft 50 mg daily.  I will see him in 4 to 6 weeks for follow-up evaluation.   Medication Adjustments/Labs and Tests Ordered: Current medicines are reviewed at length with the patient today.  Concerns regarding medicines are outlined above.  Medication changes,  Labs and Tests ordered today are listed in the  Patient Instructions below. There are no Patient Instructions on file for this visit.   Signed, Shelva Majestic, MD  01/25/2018 11:27 AM    [facility] [address] [place], [place], [place]  [address] Phone: [phone]

## 2018-01-27 ENCOUNTER — Encounter: Payer: Self-pay | Admitting: Cardiovascular Disease

## 2018-02-03 ENCOUNTER — Other Ambulatory Visit: Payer: Self-pay

## 2018-02-03 ENCOUNTER — Ambulatory Visit (HOSPITAL_COMMUNITY): Payer: 59 | Attending: Cardiovascular Disease

## 2018-02-03 ENCOUNTER — Ambulatory Visit (INDEPENDENT_AMBULATORY_CARE_PROVIDER_SITE_OTHER): Payer: 59

## 2018-02-03 DIAGNOSIS — R002 Palpitations: Secondary | ICD-10-CM | POA: Diagnosis not present

## 2018-02-03 MED ORDER — PERFLUTREN LIPID MICROSPHERE
1.0000 mL | INTRAVENOUS | Status: AC | PRN
  Administered 2018-02-03: 2 mL via INTRAVENOUS

## 2018-02-08 ENCOUNTER — Telehealth: Payer: Self-pay | Admitting: Cardiovascular Disease

## 2018-02-08 NOTE — Telephone Encounter (Signed)
New Message:    Patient returning call back about some results. Please call pt back

## 2018-02-08 NOTE — Telephone Encounter (Signed)
Patient called with results.

## 2018-02-08 NOTE — Telephone Encounter (Signed)
Notes recorded by Troy Sine, MD on 02/08/2018 at 8:03 AM EDT Normal LV function. Normal valves. Normal PA pressure

## 2018-03-18 ENCOUNTER — Ambulatory Visit (INDEPENDENT_AMBULATORY_CARE_PROVIDER_SITE_OTHER): Payer: 59 | Admitting: Cardiovascular Disease

## 2018-03-18 VITALS — BP 142/76 | HR 70 | Ht 65.0 in | Wt 256.8 lb

## 2018-03-18 DIAGNOSIS — R002 Palpitations: Secondary | ICD-10-CM | POA: Diagnosis not present

## 2018-03-18 DIAGNOSIS — I10 Essential (primary) hypertension: Secondary | ICD-10-CM

## 2018-03-18 DIAGNOSIS — E118 Type 2 diabetes mellitus with unspecified complications: Secondary | ICD-10-CM

## 2018-03-18 DIAGNOSIS — I493 Ventricular premature depolarization: Secondary | ICD-10-CM

## 2018-03-18 MED ORDER — METOPROLOL SUCCINATE ER 25 MG PO TB24: 25.0000 mg | ORAL_TABLET | Freq: Every day | ORAL | 3 refills | Status: DC

## 2018-03-18 NOTE — Patient Instructions (Signed)
Medication Instructions:  START metoprolol succinate (Toprol XL) 25 mg daily  If you need a refill on your cardiac medications before your next appointment, please call your pharmacy.   Follow-Up: At [facility], you and your health needs are our priority.  As part of our continuing mission to provide you with exceptional heart care, we have created designated Provider Care Teams.  These Care Teams include your primary Cardiologist (physician) and Advanced Practice Providers (APPs -  Physician Assistants and Nurse Practitioners) who all work together to provide you with the care you need, when you need it. You will need a follow up appointment in 6 months.  Please call our office 2 months in advance to schedule this appointment.  You may see Dr. Claiborne Billings or one of the following Advanced Practice Providers on your designated Care Team: [place], [place] . Fabian Sharp, PA-C

## 2018-03-18 NOTE — Progress Notes (Signed)
Cardiology Office Note    Date:  03/20/2018   ID:  Eric Hill, DOB 12/11/1950, MRN [id number]  PCP:  Pleas [name], NP  Cardiologist:  Shelva Majestic, MD   F/U cardiology consultation initially referred through the courtesy of Alma Friendly, NP for evaluation of palpitation  History of Present Illness:  Eric Hill is a 67 y.o. male who presents for a 2 month follow up cardiology evaluation.  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  He had noticed intermittent palpitations for 6 or 7 years and often these would seem to exacerbate during periods of increased stress.  Ultimately when they hired additional help at work the stress at work was less and palpitations improved.   For the last 6 months he has noticed more palpitations which occur more frequently and also for and also which occur for longer duration.  He denies any associated chest tightness or heaviness.  He was drinking 1 cup of 20 ounce caffeine per day but he is significantly stop caffeine use over the last several weeks.  He sleeps for approximately 5 hours per night.  He snores.  He denies daytime sleepiness.  He was  seen by Alma Friendly, NP and with his history of hypertension, diabetes mellitus, morbid obesity with increasing palpitations he was now referred for cardiology consultation and evaluation.  I saw him for my initial evaluation January 25, 2018 he was planning to retire.  He has since significantly reduced his work and with the work reduction he has not noticed any significant palpitations and has felt better.  I gave him a prescription for metoprolol tartrate 25 mg to take on an as-needed basis and he has taken this on one occasion since his initial evaluation.  He underwent an echo Doppler study on February 03, 2018 which showed normal LV function.  There is no significant valvular pathology.  He had normal PA pressure.  He wore a react monitor from October 9 through February 16, 2018.  This  predominantly showed sinus rhythm with an average rate at 62 bpm.  There were several episodes of sinus tachycardia with rates in the 120s.  He had occasional isolated PVCs and periods of transient trigeminal rhythm.  He did not have any significant pauses or atrial fibrillation.  No high-grade ectopy was detected.  He feels significantly improved since he has not been working recently.  He presents for evaluation.  Past Medical History:  Diagnosis Date  . Diverticulitis   . Erectile dysfunction   . History of depression   . Type 2 diabetes mellitus (HCC)    There is no prior surgery.  Current Medications: Outpatient Medications Prior to Visit  Medication Sig Dispense Refill  . blood glucose meter kit and supplies KIT Dispense based on patient and insurance preference. Use up to 2-3 times daily as directed. Dx is E11.9 1 each 0  . glipiZIDE (GLUCOTROL XL) 10 MG 24 hr tablet Take 1 tablet by mouth every morning with food for diabetes. 90 tablet 3  . lisinopril (PRINIVIL,ZESTRIL) 10 MG tablet TAKE 1 TABLET BY MOUTH ONCE DAILY 90 tablet 1  . metoprolol tartrate (LOPRESSOR) 25 MG tablet Take 1 tablet (25 mg total) by mouth as needed (palpitations). 30 tablet 3  . sertraline (ZOLOFT) 50 MG tablet TAKE 1 TABLET BY MOUTH ONCE DAILY 90 tablet 3  . tamsulosin (FLOMAX) 0.4 MG CAPS capsule TAKE 1 CAPSULE BY MOUTH ONCE DAILY WITH BREAKFAST FOR URINE FLOW 90 capsule 0  No facility-administered medications prior to visit.      Allergies:   Patient has no known allergies.   Social History   Socioeconomic History  . Marital status: Single    Spouse name: Not on file  . Number of children: Not on file  . Years of education: Not on file  . Highest education level: Not on file  Occupational History  . Not on file  Social Needs  . Financial resource strain: Not on file  . Food insecurity:    Worry: Not on file    Inability: Not on file  . Transportation needs:    Medical: Not on file     Non-medical: Not on file  Tobacco Use  . Smoking status: Former Research scientist (life sciences)  . Smokeless tobacco: Never Used  Substance and Sexual Activity  . Alcohol use: Yes    Alcohol/week: 0.0 standard drinks    Comment: rarely  . Drug use: No  . Sexual activity: Not on file  Lifestyle  . Physical activity:    Days per week: Not on file    Minutes per session: Not on file  . Stress: Not on file  Relationships  . Social connections:    Talks on phone: Not on file    Gets together: Not on file    Attends religious service: Not on file    Active member of club or organization: Not on file    Attends meetings of clubs or organizations: Not on file    Relationship status: Not on file  Other Topics Concern  . Not on file  Social History Narrative   Single   Works as a Mudlogger.   No children   Lives in [place]   Enjoys racing cars, Designer, fashion/clothing.    He is single and never married.  He lives by himself.  He is a Holiday representative.  He had smoked cigarettes but quit in 1992.  He does not drink alcohol.  He does not exercise after his work.  Family History:  The patient's family history includes Cancer in his maternal grandfather and sister; Neuropathy in his mother.  Both parents are deceased.  His mother had heart problems and a stroke.  Father had Parkinson's disease.  ROS General: Negative; No fevers, chills, or night sweats;  HEENT: Negative; No changes in vision or hearing, sinus congestion, difficulty swallowing Pulmonary: Negative; No cough, wheezing, shortness of breath, hemoptysis Cardiovascular: Negative; No chest pain, presyncope, syncope, palpitations GI: Negative; No nausea, vomiting, diarrhea, or abdominal pain GU: Negative; No dysuria, hematuria, or difficulty voiding Musculoskeletal: Negative; no myalgias, joint pain, or weakness Hematologic/Oncology: Negative; no easy bruising, bleeding Endocrine: Negative; no heat/cold intolerance; no diabetes Neuro: Negative; no  changes in balance, headaches Skin: Negative; No rashes or skin lesions Psychiatric: Negative; No behavioral problems, depression Sleep: Negative; No snoring, daytime sleepiness, hypersomnolence, bruxism, restless legs, hypnogognic hallucinations, no cataplexy Other comprehensive 14 point system review is negative.   PHYSICAL EXAM:   VS:  BP (!) 142/76   Pulse 70   Ht '5\' 5"'  (1.651 m)   Wt 256 lb 12.8 oz (116.5 kg)   BMI 42.73 kg/m     Repeat blood pressure by me was 136/74  Wt Readings from Last 3 Encounters:  03/18/18 256 lb 12.8 oz (116.5 kg)  01/25/18 255 lb (115.7 kg)  01/22/18 258 lb 8 oz (117.3 kg)    General: Alert, oriented, no distress.  Skin: normal turgor, no rashes, warm and dry HEENT:  Normocephalic, atraumatic. Pupils equal round and reactive to light; sclera anicteric; extraocular muscles intact;  Nose without nasal septal hypertrophy Mouth/Parynx benign; Mallinpatti scale 3 Neck: No JVD, no carotid bruits; normal carotid upstroke Lungs: clear to ausculatation and percussion; no wheezing or rales Chest wall: without tenderness to palpitation Heart: PMI not displaced, RRR, s1 s2 normal, 1/6 systolic murmur, no diastolic murmur, no rubs, gallops, thrills, or heaves Abdomen: soft, nontender; no hepatosplenomehaly, BS+; abdominal aorta nontender and not dilated by palpation. Back: no CVA tenderness Pulses 2+ Musculoskeletal: full range of motion, normal strength, no joint deformities Extremities: no clubbing cyanosis or edema, Homan's sign negative  Neurologic: grossly nonfocal; Cranial nerves grossly wnl Psychologic: Normal mood and affect   Studies/Labs Reviewed:   EKG:  EKG is ordered today.  ECG (independently read by me): Sinus rhythm with occasional PVCs with retrograde P waves.  PR interval 166.  QTc interval 423.  January 25, 2018 ECG (independently read by me): Normal sinus rhythm at 68 bpm.  No ectopy.  Normal intervals.  Recent Labs: BMP Latest  Ref Rng & Units 01/22/2018 09/22/2017 09/15/2016  Glucose 70 - 99 mg/dL 184(H) 141(H) 151(H)  BUN 6 - 23 mg/dL 25(H) 20 21  Creatinine 0.40 - 1.50 mg/dL 0.74 0.67 0.84  Sodium 135 - 145 mEq/L 139 137 137  Potassium 3.5 - 5.1 mEq/L 3.8 4.0 4.3  Chloride 96 - 112 mEq/L 104 103 102  CO2 19 - 32 mEq/L '28 27 28  ' Calcium 8.4 - 10.5 mg/dL 8.8 9.1 9.4     Hepatic Function Latest Ref Rng & Units 09/22/2017 09/15/2016 12/18/2015  Total Protein 6.0 - 8.3 g/dL 7.6 7.7 7.4  Albumin 3.5 - 5.2 g/dL 4.0 4.1 4.1  AST 0 - 37 U/L '18 17 21  ' ALT 0 - 53 U/L '16 15 21  ' Alk Phosphatase 39 - 117 U/L 58 62 66  Total Bilirubin 0.2 - 1.2 mg/dL 0.6 0.9 0.4    CBC Latest Ref Rng & Units 01/22/2018 09/11/2014  WBC 4.0 - 10.5 K/uL 7.4 7.2  Hemoglobin 13.0 - 17.0 g/dL 15.0 15.5  Hematocrit 39.0 - 52.0 % 43.6 45.4  Platelets 150.0 - 400.0 K/uL 251.0 262.0   Lab Results  Component Value Date   MCV 88.7 01/22/2018   MCV 88.0 09/11/2014   Lab Results  Component Value Date   TSH 1.74 01/22/2018   Lab Results  Component Value Date   HGBA1C 6.5 01/22/2018     BNP No results found for: BNP  ProBNP No results found for: PROBNP   Lipid Panel     Component Value Date/Time   CHOL 164 09/22/2017 1000   TRIG 90.0 09/22/2017 1000   HDL 51.00 09/22/2017 1000   CHOLHDL 3 09/22/2017 1000   VLDL 18.0 09/22/2017 1000   LDLCALC 95 09/22/2017 1000   LDLDIRECT 109.0 12/18/2015 1523     RADIOLOGY: No results found.   Additional studies/ records that were reviewed today include:  I reviewed the records from Alma Friendly, NP VI revewed the cardiac monitor October 9 through February 16, 2018.  Echo Doppler study, and prior laboratory.   ASSESSMENT:    1. Palpitations   2. PVC's (premature ventricular contractions)   3. Essential hypertension   4. Morbid obesity ([place])   5. Type 2 diabetes mellitus with complication, without long-term current use of insulin [facility])     PLAN:  Mr. Safal Halderman is a  67 year old gentleman who has a history of hypertension, type 2  diabetes mellitus, morbid obesity, and recently has noticed increasing palpitations.  In the past he has had palpitations which were exacerbated by periods of increased stress.  His echo Doppler study has demonstrated normal systolic action wall motion abnormalities.  There was no significant valvular pathology.  He had normal pulmonary pressures.  His two-week cardiac monitor study shows predominant sinus rhythm average rate at 62 bpm there were instances of unifocal PVCs with transient episodes of trigeminy.  His fastest heart beat was sinus tachycardia in the 120s.  No high-grade ectopy was detected.  There were no pauses.  There were no episodes of atrial fibrillation.  ECG today continues to show PVCs.  His blood pressure is lightly elevated based on new hypertensive guidelines.  His history of stress induced palpitations, I have elected to add very low-dose metoprolol succinate 25 mg daily which will be beneficial both for ectopy suppression as well as blood pressure control and possible attenuation of stress.  His BMI is 42.73 and is consistent with morbid obesity.  I discussed with him now that he is essentially retired he should exercise at least 5 days/week for minimum of 30 minutes.  Presently he believes he is sleeping well but he will monitor his sleep pattern.  He is diabetic on glipizide.  Lipid studies by his primary 5 months ago has shown an LDL of 95.  Target LDL and diabetes mellitus is less than 70.  Laboratory will be obtained by his primary provider.  I will see him in 6 months for follow-up evaluation.   Medication Adjustments/Labs and Tests Ordered: Current medicines are reviewed at length with the patient today.  Concerns regarding medicines are outlined above.  Medication changes, Labs and Tests ordered today are listed in the Patient Instructions below. Patient Instructions  Medication Instructions:  START metoprolol  succinate (Toprol XL) 25 mg daily  If you need a refill on your cardiac medications before your next appointment, please call your pharmacy.   Follow-Up: At [facility], you and your health needs are our priority.  As part of our continuing mission to provide you with exceptional heart care, we have created designated Provider Care Teams.  These Care Teams include your primary Cardiologist (physician) and Advanced Practice Providers (APPs -  Physician Assistants and Nurse Practitioners) who all work together to provide you with the care you need, when you need it. You will need a follow up appointment in 6 months.  Please call our office 2 months in advance to schedule this appointment.  You may see Dr. Claiborne Billings or one of the following Advanced Practice Providers on your designated Care Team: [place], [place] . Fabian Sharp, PA-C       Signed, Shelva Majestic, MD  03/20/2018 1:21 PM    [facility] [address] [place], [place], [place]  [address] Phone: [phone]

## 2018-03-20 ENCOUNTER — Encounter: Payer: Self-pay | Admitting: Cardiovascular Disease

## 2018-03-31 ENCOUNTER — Encounter: Payer: Self-pay | Admitting: Primary Care

## 2018-03-31 ENCOUNTER — Ambulatory Visit (INDEPENDENT_AMBULATORY_CARE_PROVIDER_SITE_OTHER): Payer: Medicare Other | Admitting: Primary Care

## 2018-03-31 VITALS — BP 140/72 | HR 64 | Temp 98.0°F | Ht 65.0 in | Wt 259.5 lb

## 2018-03-31 DIAGNOSIS — Z23 Encounter for immunization: Secondary | ICD-10-CM

## 2018-03-31 DIAGNOSIS — I10 Essential (primary) hypertension: Secondary | ICD-10-CM

## 2018-03-31 DIAGNOSIS — E119 Type 2 diabetes mellitus without complications: Secondary | ICD-10-CM | POA: Diagnosis not present

## 2018-03-31 MED ORDER — LISINOPRIL 20 MG PO TABS: 20.0000 mg | ORAL_TABLET | Freq: Every day | ORAL | 3 refills | Status: DC

## 2018-03-31 MED ORDER — ATORVASTATIN CALCIUM 20 MG PO TABS: 20.0000 mg | ORAL_TABLET | Freq: Every day | ORAL | 3 refills | Status: DC

## 2018-03-31 NOTE — Assessment & Plan Note (Signed)
A1C of 6.5 in September 2019. Pneumonia vaccination due today. Foot exam today. Managed on ACE. Initiated statin today.  Follow up in 6 months.

## 2018-03-31 NOTE — Addendum Note (Signed)
Addended by: Jacqualin Combes on: 03/31/2018 11:03 AM   Modules accepted: Orders

## 2018-03-31 NOTE — Patient Instructions (Signed)
We've increased the dose of your lisinopril to 20 mg daily. You may take 2 of the 10 mg tablets to make 20 mg until your bottle is empty.  Continue glipizide ER daily for diabetes.  Start atorvastatin 20 mg once daily for cholesterol.   Please schedule a follow up appointment in 1 month for blood pressure and cholesterol check. Make sure you don't eat 4 hours prior.  It was a pleasure to see you today!

## 2018-03-31 NOTE — Assessment & Plan Note (Signed)
Above goal in the office today, also on prior visits. Increase lisinopril to 20 mg. Discontinued metoprolol succinate as he's not taking.   Follow up in 1 month for BP check.

## 2018-03-31 NOTE — Progress Notes (Signed)
Subjective:    Patient ID: Eric Hill, male    DOB: 1950-08-24, 67 y.o.   MRN: [id number]  HPI  [name] is a 67 year old male who presents today for follow up of diabetes and hypertension.  Current medications include: Glipizide XL 10 mg daily.  Last A1C: 6.5 in September 2019 Last Eye Exam: Completed in April 2019 Last Foot Exam: Due Pneumonia Vaccination: Never completed  ACE/ARB: Lisinopril 10 mg Statin: LDL of 95 in May 2019, has declined statin treatment in the past   2) Essential Hypertension: Currently managed on lisinopril 10 mg and also on metoprolol succinate 25 mg which was initiated on 03/18/18 by his cardiologist. Since he's felt worse since taking metoprolol succinate, feels dizzy and as though his heart "stops". He's since resumed the metoprolol tartrate 25 mg PRN. He's undergone cardiac work up including echocardiogram, heart monitor study for 2 weeks. Overall was found to have PVC's.   He's checking his BP at home which runs 140-150's/50-60's. He has since retired and has a lot less stress.    BP Readings from Last 3 Encounters:  03/31/18 140/72  03/18/18 (!) 142/76  01/25/18 134/68      Review of Systems  Eyes: Negative for visual disturbance.  Respiratory: Negative for shortness of breath.   Cardiovascular: Negative for chest pain.       Intermittent palpitations   Neurological: Negative for dizziness and numbness.       Past Medical History:  Diagnosis Date  . Diverticulitis   . Erectile dysfunction   . History of depression   . Type 2 diabetes mellitus ([place])      Social History   Socioeconomic History  . Marital status: Single    Spouse name: Not on file  . Number of children: Not on file  . Years of education: Not on file  . Highest education level: Not on file  Occupational History  . Not on file  Social Needs  . Financial resource strain: Not on file  . Food insecurity:    Worry: Not on file    Inability: Not on  file  . Transportation needs:    Medical: Not on file    Non-medical: Not on file  Tobacco Use  . Smoking status: Former Research scientist (life sciences)  . Smokeless tobacco: Never Used  Substance and Sexual Activity  . Alcohol use: Yes    Alcohol/week: 0.0 standard drinks    Comment: rarely  . Drug use: No  . Sexual activity: Not on file  Lifestyle  . Physical activity:    Days per week: Not on file    Minutes per session: Not on file  . Stress: Not on file  Relationships  . Social connections:    Talks on phone: Not on file    Gets together: Not on file    Attends religious service: Not on file    Active member of club or organization: Not on file    Attends meetings of clubs or organizations: Not on file    Relationship status: Not on file  . Intimate partner violence:    Fear of current or ex partner: Not on file    Emotionally abused: Not on file    Physically abused: Not on file    Forced sexual activity: Not on file  Other Topics Concern  . Not on file  Social History Narrative   Single   Works as a Mudlogger.   No children   Lives  in [place]   Enjoys racing cars, Designer, fashion/clothing.    No past surgical history on file.  Family History  Problem Relation Age of Onset  . Neuropathy Mother   . Cancer Sister        Back, brain  . Cancer Maternal Grandfather        Abdominal     No Known Allergies  Current Outpatient Medications on File Prior to Visit  Medication Sig Dispense Refill  . blood glucose meter kit and supplies KIT Dispense based on patient and insurance preference. Use up to 2-3 times daily as directed. Dx is E11.9 1 each 0  . glipiZIDE (GLUCOTROL XL) 10 MG 24 hr tablet Take 1 tablet by mouth every morning with food for diabetes. 90 tablet 3  . metoprolol succinate (TOPROL XL) 25 MG 24 hr tablet Take 1 tablet (25 mg total) by mouth daily. 90 tablet 3  . metoprolol tartrate (LOPRESSOR) 25 MG tablet Take 1 tablet (25 mg total) by mouth as needed (palpitations). 30  tablet 3  . sertraline (ZOLOFT) 50 MG tablet TAKE 1 TABLET BY MOUTH ONCE DAILY 90 tablet 3  . tamsulosin (FLOMAX) 0.4 MG CAPS capsule TAKE 1 CAPSULE BY MOUTH ONCE DAILY WITH BREAKFAST FOR URINE FLOW 90 capsule 0  . [DISCONTINUED] lisinopril (PRINIVIL,ZESTRIL) 10 MG tablet TAKE 1 TABLET BY MOUTH ONCE DAILY 90 tablet 1   No current facility-administered medications on file prior to visit.     BP 140/72   Pulse 64   Temp 98 F (36.7 C) (Oral)   Ht '5\' 5"'  (1.651 m)   Wt 259 lb 8 oz (117.7 kg)   SpO2 96%   BMI 43.18 kg/m    Objective:   Physical Exam  Constitutional: He appears well-nourished.  Neck: Neck supple.  Cardiovascular: Normal rate and regular rhythm.  Respiratory: Effort normal and breath sounds normal.  Skin: Skin is warm and dry.           Assessment & Plan:

## 2018-05-03 ENCOUNTER — Encounter: Payer: Self-pay | Admitting: *Deleted

## 2018-05-03 ENCOUNTER — Ambulatory Visit (INDEPENDENT_AMBULATORY_CARE_PROVIDER_SITE_OTHER): Payer: Medicare Other | Admitting: Primary Care

## 2018-05-03 ENCOUNTER — Encounter: Payer: Self-pay | Admitting: Primary Care

## 2018-05-03 VITALS — BP 126/78 | HR 69 | Temp 98.1°F | Ht 65.0 in | Wt 257.5 lb

## 2018-05-03 DIAGNOSIS — I10 Essential (primary) hypertension: Secondary | ICD-10-CM

## 2018-05-03 DIAGNOSIS — Z23 Encounter for immunization: Secondary | ICD-10-CM

## 2018-05-03 DIAGNOSIS — E119 Type 2 diabetes mellitus without complications: Secondary | ICD-10-CM | POA: Diagnosis not present

## 2018-05-03 LAB — BASIC METABOLIC PANEL
BUN: 22 mg/dL (ref 6–23)
CALCIUM: 9.3 mg/dL (ref 8.4–10.5)
CO2: 27 mEq/L (ref 19–32)
CREATININE: 0.76 mg/dL (ref 0.40–1.50)
Chloride: 102 mEq/L (ref 96–112)
GFR: 109.36 mL/min (ref >60.00)
Glucose, Bld: 146 mg/dL — ABNORMAL HIGH (ref 70–99)
Potassium: 4.3 mEq/L (ref 3.5–5.1)
Sodium: 136 mEq/L (ref 135–145)

## 2018-05-03 LAB — HEMOGLOBIN A1C: HEMOGLOBIN A1C: 6.2 % (ref 4.6–6.5)

## 2018-05-03 NOTE — Assessment & Plan Note (Signed)
Improved on lisinopril 20 mg, continue same.  BMP pending.

## 2018-05-03 NOTE — Patient Instructions (Addendum)
Stop by the lab prior to leaving today. I will notify you of your results once received.   Continue taking Lisinopril 20 mg for blood pressure.  Please schedule a Welcome to Medicare physical with me in 6 months. You may also schedule a lab only appointment 3-4 days prior. We will discuss your lab results in detail during your physical.  It was a pleasure to see you today!

## 2018-05-03 NOTE — Addendum Note (Signed)
Addended by: Jacqualin Combes on: 05/03/2018 09:09 AM   Modules accepted: Orders

## 2018-05-03 NOTE — Progress Notes (Signed)
Subjective:    Patient ID: Eric Hill, male    DOB: 09-27-1950, 68 y.o.   MRN: [id number]  HPI  Eric Hill is a 68 year old male who presents today for follow up of hypertension.  He was last evaluated on month ago with elevated blood pressure readings on Lisinopril 10 mg. Given these elevated readings his dose was increased to 20 mg and he was asked to follow up.  Since his last visit he's checking his BP at home which is running 120's/70's. He denies chest pain, dizziness, shortness of breath. He's not had to take his metoprolol tartrate in two weeks.   BP Readings from Last 3 Encounters:  05/03/18 126/78  03/31/18 140/72  03/18/18 (!) 142/76     Review of Systems  Constitutional: Negative for fatigue.  Respiratory: Negative for shortness of breath.   Cardiovascular: Negative for chest pain and palpitations.  Neurological: Negative for dizziness and headaches.       Past Medical History:  Diagnosis Date  . Diverticulitis   . Erectile dysfunction   . History of depression   . Type 2 diabetes mellitus ([place])      Social History   Socioeconomic History  . Marital status: Single    Spouse name: Not on file  . Number of children: Not on file  . Years of education: Not on file  . Highest education level: Not on file  Occupational History  . Not on file  Social Needs  . Financial resource strain: Not on file  . Food insecurity:    Worry: Not on file    Inability: Not on file  . Transportation needs:    Medical: Not on file    Non-medical: Not on file  Tobacco Use  . Smoking status: Former Research scientist (life sciences)  . Smokeless tobacco: Never Used  Substance and Sexual Activity  . Alcohol use: Yes    Alcohol/week: 0.0 standard drinks    Comment: rarely  . Drug use: No  . Sexual activity: Not on file  Lifestyle  . Physical activity:    Days per week: Not on file    Minutes per session: Not on file  . Stress: Not on file  Relationships  . Social connections:   Talks on phone: Not on file    Gets together: Not on file    Attends religious service: Not on file    Active member of club or organization: Not on file    Attends meetings of clubs or organizations: Not on file    Relationship status: Not on file  . Intimate partner violence:    Fear of current or ex partner: Not on file    Emotionally abused: Not on file    Physically abused: Not on file    Forced sexual activity: Not on file  Other Topics Concern  . Not on file  Social History Narrative   Single   Works as a Mudlogger.   No children   Lives in [place]   Enjoys racing cars, Designer, fashion/clothing.    No past surgical history on file.  Family History  Problem Relation Age of Onset  . Neuropathy Mother   . Cancer Sister        Back, brain  . Cancer Maternal Grandfather        Abdominal     No Known Allergies  Current Outpatient Medications on File Prior to Visit  Medication Sig Dispense Refill  . atorvastatin (LIPITOR) 20 MG  tablet Take 1 tablet (20 mg total) by mouth daily. For cholesterol. 90 tablet 3  . blood glucose meter kit and supplies KIT Dispense based on patient and insurance preference. Use up to 2-3 times daily as directed. Dx is E11.9 1 each 0  . glipiZIDE (GLUCOTROL XL) 10 MG 24 hr tablet Take 1 tablet by mouth every morning with food for diabetes. 90 tablet 3  . lisinopril (PRINIVIL,ZESTRIL) 20 MG tablet Take 1 tablet (20 mg total) by mouth daily. For blood pressure. 90 tablet 3  . metoprolol tartrate (LOPRESSOR) 25 MG tablet Take 1 tablet (25 mg total) by mouth as needed (palpitations). 30 tablet 3  . sertraline (ZOLOFT) 50 MG tablet TAKE 1 TABLET BY MOUTH ONCE DAILY 90 tablet 3  . tamsulosin (FLOMAX) 0.4 MG CAPS capsule TAKE 1 CAPSULE BY MOUTH ONCE DAILY WITH BREAKFAST FOR URINE FLOW 90 capsule 0   No current facility-administered medications on file prior to visit.     BP 126/78   Pulse 69   Temp 98.1 F (36.7 C) (Oral)   Ht 5' 5" (1.651 m)    Wt 257 lb 8 oz (116.8 kg)   SpO2 96%   BMI 42.85 kg/m    Objective:   Physical Exam  Constitutional: He appears well-nourished.  Neck: Neck supple.  Cardiovascular: Normal rate and regular rhythm.  Respiratory: Effort normal and breath sounds normal.  Skin: Skin is warm and dry.           Assessment & Plan:

## 2018-05-03 NOTE — Assessment & Plan Note (Signed)
Endorses glucose readings below 110 every morning. Compliant to current regimen. Repeat A1C pending.

## 2018-05-07 ENCOUNTER — Encounter: Payer: Self-pay | Admitting: *Deleted

## 2018-10-05 ENCOUNTER — Encounter: Payer: Medicare Other | Admitting: Primary Care

## 2018-10-18 ENCOUNTER — Other Ambulatory Visit: Payer: Self-pay | Admitting: Primary Care

## 2018-10-18 DIAGNOSIS — R3915 Urgency of urination: Secondary | ICD-10-CM

## 2018-10-20 ENCOUNTER — Other Ambulatory Visit: Payer: Self-pay | Admitting: Primary Care

## 2018-10-20 DIAGNOSIS — Z125 Encounter for screening for malignant neoplasm of prostate: Secondary | ICD-10-CM

## 2018-10-20 DIAGNOSIS — E119 Type 2 diabetes mellitus without complications: Secondary | ICD-10-CM

## 2018-10-20 DIAGNOSIS — E559 Vitamin D deficiency, unspecified: Secondary | ICD-10-CM

## 2018-10-20 DIAGNOSIS — I10 Essential (primary) hypertension: Secondary | ICD-10-CM

## 2018-10-27 ENCOUNTER — Other Ambulatory Visit: Payer: Self-pay | Admitting: Primary Care

## 2018-10-27 ENCOUNTER — Telehealth: Payer: Self-pay

## 2018-10-27 ENCOUNTER — Other Ambulatory Visit: Payer: Medicare Other

## 2018-10-27 ENCOUNTER — Other Ambulatory Visit: Payer: Self-pay

## 2018-10-27 NOTE — Telephone Encounter (Signed)
Opened in error

## 2018-10-30 ENCOUNTER — Other Ambulatory Visit: Payer: Self-pay | Admitting: Primary Care

## 2018-10-30 DIAGNOSIS — E119 Type 2 diabetes mellitus without complications: Secondary | ICD-10-CM

## 2018-11-01 ENCOUNTER — Other Ambulatory Visit: Payer: Self-pay

## 2018-11-01 ENCOUNTER — Other Ambulatory Visit (INDEPENDENT_AMBULATORY_CARE_PROVIDER_SITE_OTHER): Payer: Medicare Other

## 2018-11-01 DIAGNOSIS — E119 Type 2 diabetes mellitus without complications: Secondary | ICD-10-CM

## 2018-11-01 DIAGNOSIS — E559 Vitamin D deficiency, unspecified: Secondary | ICD-10-CM

## 2018-11-01 DIAGNOSIS — I10 Essential (primary) hypertension: Secondary | ICD-10-CM

## 2018-11-01 DIAGNOSIS — Z125 Encounter for screening for malignant neoplasm of prostate: Secondary | ICD-10-CM | POA: Diagnosis not present

## 2018-11-01 LAB — LIPID PANEL
Cholesterol: 158 mg/dL (ref 0–200)
HDL: 39.3 mg/dL (ref >39.00)
LDL Cholesterol: 83 mg/dL (ref 0–99)
NonHDL: 118.72
Total CHOL/HDL Ratio: 4
Triglycerides: 180 mg/dL — ABNORMAL HIGH (ref 0.0–149.0)
VLDL: 36 mg/dL (ref 0.0–40.0)

## 2018-11-01 LAB — COMPREHENSIVE METABOLIC PANEL
ALT: 14 U/L (ref 0–53)
AST: 14 U/L (ref 0–37)
Albumin: 4 g/dL (ref 3.5–5.2)
Alkaline Phosphatase: 77 U/L (ref 39–117)
BUN: 17 mg/dL (ref 6–23)
CO2: 28 mEq/L (ref 19–32)
Calcium: 8.7 mg/dL (ref 8.4–10.5)
Chloride: 103 mEq/L (ref 96–112)
Creatinine, Ser: 0.81 mg/dL (ref 0.40–1.50)
GFR: 95.45 mL/min (ref >60.00)
Glucose, Bld: 150 mg/dL — ABNORMAL HIGH (ref 70–99)
Potassium: 4.5 mEq/L (ref 3.5–5.1)
Sodium: 138 mEq/L (ref 135–145)
Total Bilirubin: 0.5 mg/dL (ref 0.2–1.2)
Total Protein: 6.8 g/dL (ref 6.0–8.3)

## 2018-11-01 LAB — VITAMIN D 25 HYDROXY (VIT D DEFICIENCY, FRACTURES): VITD: 10.75 ng/mL — ABNORMAL LOW (ref 30.00–100.00)

## 2018-11-01 LAB — PSA, MEDICARE: PSA: 0.67 ng/ml (ref 0.10–4.00)

## 2018-11-01 LAB — HEMOGLOBIN A1C: Hgb A1c MFr Bld: 6.2 % (ref 4.6–6.5)

## 2018-11-02 ENCOUNTER — Ambulatory Visit (INDEPENDENT_AMBULATORY_CARE_PROVIDER_SITE_OTHER): Payer: Medicare Other | Admitting: Primary Care

## 2018-11-02 ENCOUNTER — Encounter: Payer: Self-pay | Admitting: Primary Care

## 2018-11-02 ENCOUNTER — Other Ambulatory Visit: Payer: Self-pay

## 2018-11-02 VITALS — BP 136/86 | HR 76 | Temp 97.4°F | Ht 65.0 in | Wt 260.8 lb

## 2018-11-02 DIAGNOSIS — E559 Vitamin D deficiency, unspecified: Secondary | ICD-10-CM

## 2018-11-02 DIAGNOSIS — Z0000 Encounter for general adult medical examination without abnormal findings: Secondary | ICD-10-CM | POA: Diagnosis not present

## 2018-11-02 DIAGNOSIS — Z23 Encounter for immunization: Secondary | ICD-10-CM

## 2018-11-02 DIAGNOSIS — F320 Major depressive disorder, single episode, mild: Secondary | ICD-10-CM | POA: Diagnosis not present

## 2018-11-02 DIAGNOSIS — Z1211 Encounter for screening for malignant neoplasm of colon: Secondary | ICD-10-CM

## 2018-11-02 DIAGNOSIS — R3915 Urgency of urination: Secondary | ICD-10-CM

## 2018-11-02 DIAGNOSIS — I10 Essential (primary) hypertension: Secondary | ICD-10-CM

## 2018-11-02 DIAGNOSIS — E119 Type 2 diabetes mellitus without complications: Secondary | ICD-10-CM

## 2018-11-02 MED ORDER — VITAMIN D (ERGOCALCIFEROL) 1.25 MG (50000 UNIT) PO CAPS: ORAL_CAPSULE | ORAL | 1 refills | Status: DC

## 2018-11-02 MED ORDER — ZOSTER VAC RECOMB ADJUVANTED 50 MCG/0.5ML IM SUSR: 0.5000 mL | Freq: Once | INTRAMUSCULAR | 1 refills | Status: AC

## 2018-11-02 MED ORDER — SERTRALINE HCL 50 MG PO TABS: 75.0000 mg | ORAL_TABLET | Freq: Every day | ORAL | 3 refills | Status: DC

## 2018-11-02 NOTE — Assessment & Plan Note (Signed)
Doing better since he increased his Zoloft to 75 mg. Changed prescription for this dose. He will update. Denies SI/HI.

## 2018-11-02 NOTE — Assessment & Plan Note (Signed)
Borderline high today but home readings are better. He would like to work on exercise and diet. Continue current regimen. Discussed to notify if readings at home are at or above 135/90.  BMP unremarkable.

## 2018-11-02 NOTE — Progress Notes (Signed)
Patient ID: Eric Hill, male   DOB: 1951-04-02, 68 y.o.   MRN: [id number]  Mr. Delucia is a 68 year old male who presents today for Welcome to Medicare Visit.  HPI:  Past Medical History:  Diagnosis Date  . Diverticulitis   . Erectile dysfunction   . History of depression   . Type 2 diabetes mellitus ([place])     Current Outpatient Medications  Medication Sig Dispense Refill  . atorvastatin (LIPITOR) 20 MG tablet Take 1 tablet (20 mg total) by mouth daily. For cholesterol. 90 tablet 3  . blood glucose meter kit and supplies KIT Dispense based on patient and insurance preference. Use up to 2-3 times daily as directed. Dx is E11.9 1 each 0  . glipiZIDE (GLUCOTROL XL) 10 MG 24 hr tablet TAKE 1 TABLET BY MOUTH IN THE MORNING WITH FOOD FOR DIABETES 90 tablet 0  . lisinopril (PRINIVIL,ZESTRIL) 20 MG tablet Take 1 tablet (20 mg total) by mouth daily. For blood pressure. 90 tablet 3  . metoprolol tartrate (LOPRESSOR) 25 MG tablet Take 1 tablet (25 mg total) by mouth as needed (palpitations). 30 tablet 3  . sertraline (ZOLOFT) 50 MG tablet TAKE 1 TABLET BY MOUTH ONCE DAILY 90 tablet 3  . tamsulosin (FLOMAX) 0.4 MG CAPS capsule TAKE 1 CAPSULE BY MOUTH ONCE DAILY WITH BREAKFAST FOR URINE FLOW 90 capsule 1   No current facility-administered medications for this visit.     No Known Allergies  Family History  Problem Relation Age of Onset  . Neuropathy Mother   . Cancer Sister        Back, brain  . Cancer Maternal Grandfather        Abdominal     Social History   Socioeconomic History  . Marital status: Single    Spouse name: Not on file  . Number of children: Not on file  . Years of education: Not on file  . Highest education level: Not on file  Occupational History  . Not on file  Social Needs  . Financial resource strain: Not on file  . Food insecurity    Worry: Not on file    Inability: Not on file  . Transportation needs    Medical: Not on file    Non-medical: Not  on file  Tobacco Use  . Smoking status: Former Research scientist (life sciences)  . Smokeless tobacco: Never Used  Substance and Sexual Activity  . Alcohol use: Yes    Alcohol/week: 0.0 standard drinks    Comment: rarely  . Drug use: No  . Sexual activity: Not on file  Lifestyle  . Physical activity    Days per week: Not on file    Minutes per session: Not on file  . Stress: Not on file  Relationships  . Social Herbalist on phone: Not on file    Gets together: Not on file    Attends religious service: Not on file    Active member of club or organization: Not on file    Attends meetings of clubs or organizations: Not on file    Relationship status: Not on file  . Intimate partner violence    Fear of current or ex partner: Not on file    Emotionally abused: Not on file    Physically abused: Not on file    Forced sexual activity: Not on file  Other Topics Concern  . Not on file  Social History Narrative   Single   Works as  a Mudlogger.   No children   Lives in [place]   Enjoys racing cars, Designer, fashion/clothing.    Hospitiliaztions: None  Health Maintenance:    Flu: Due this season   Tetanus: Completed in 2017  Pneumovax: Completed in December 2019  Shingrix: Never completed  Colonoscopy: Completed over 10 years ago.   Eye Doctor: Due later this year  Dental Exam: No recent exam  PSA: Completed  Hep C Screening: Negative    Providers: Alma Friendly, PCP; Dr. Claiborne Billings, cardiology.   I have personally reviewed and have noted: 1. The patient's medical and social history 2. Their use of alcohol, tobacco or illicit drugs 3. Their current medications and supplements 4. The patient's functional ability including ADL's, fall risks, home  safety risks and hearing or visual impairment. 5. Diet and physical activities 6. Evidence for depression or mood disorder  Subjective:   Review of Systems:   Constitutional: Denies fever, malaise, fatigue, headache or abrupt weight  changes.  HEENT: Denies eye pain, eye redness, ear pain, ringing in the ears, wax buildup, runny nose, nasal congestion, bloody nose, or sore throat. Respiratory: Denies difficulty breathing, shortness of breath, cough or sputum production.   Cardiovascular: Denies chest pain, chest tightness, palpitations. Gastrointestinal: Denies bloating, constipation, diarrhea or blood in the stool.  GU: Denies urgency, frequency, pain with urination, burning sensation, blood in urine, odor or discharge. Doing well on tamsulosin overall.  Musculoskeletal: Denies decrease in range of motion, difficulty with gait, muscle pain or joint pain and swelling.  Skin: Denies redness, rashes, lesions or ulcercations.  Neurological: Denies dizziness, difficulty with memory, difficulty with speech or problems with balance and coordination.  Psychiatric: Denies concerns for anxiety or depression. He did recently increase his Zoloft to 75 mg and is doing better.   He is checking his blood sugar twice daily which is running 80's-130's.  No other specific complaints in a complete review of systems (except as listed in HPI above).  Objective:  PE:   BP 136/86   Pulse 76   Temp (!) 97.4 F (36.3 C) (Temporal)   Ht _0  (1.651 m)   Wt 260 lb 12 oz (118.3 kg)   SpO2 98%   BMI 43.39 kg/m  Wt Readings from Last 3 Encounters:  11/02/18 260 lb 12 oz (118.3 kg)  05/03/18 257 lb 8 oz (116.8 kg)  03/31/18 259 lb 8 oz (117.7 kg)    General: Appears their stated age, well developed, well nourished in NAD. Skin: Warm, dry and intact. No rashes, lesions or ulcerations noted. HEENT: Head: normal shape and size; Eyes: sclera white, no icterus, conjunctiva pink, PERRLA and EOMs intact; Ears: Tm's gray and intact, normal light reflex; Nose: mucosa pink and moist, septum midline; Throat/Mouth: Teeth present, mucosa pink and moist, no exudate, lesions or ulcerations noted.  Neck: Normal range of motion. Neck supple, trachea  midline. No massses, lumps or thyromegaly present.  Cardiovascular: Normal rate and rhythm. S1,S2 noted.  No murmur, rubs or gallops noted. No JVD or BLE edema. No carotid bruits noted. Pulmonary/Chest: Normal effort and positive vesicular breath sounds. No respiratory distress. No wheezes, rales or ronchi noted.  Abdomen: Soft and nontender. Normal bowel sounds, no bruits noted. No distention or masses noted. Liver, spleen and kidneys non palpable. Musculoskeletal: Normal range of motion. No signs of joint swelling. No difficulty with gait.  Neurological: Alert and oriented. Cranial nerves II-XII intact. Coordination normal. +DTRs bilaterally. Psychiatric: Mood and affect normal. Behavior  is normal. Judgment and thought content normal.   EKG: See chart  BMET    Component Value Date/Time   NA 138 11/01/2018 0851   K 4.5 11/01/2018 0851   CL 103 11/01/2018 0851   CO2 28 11/01/2018 0851   GLUCOSE 150 (H) 11/01/2018 0851   BUN 17 11/01/2018 0851   CREATININE 0.81 11/01/2018 0851   CALCIUM 8.7 11/01/2018 0851    Lipid Panel     Component Value Date/Time   CHOL 158 11/01/2018 0851   TRIG 180.0 (H) 11/01/2018 0851   HDL 39.30 11/01/2018 0851   CHOLHDL 4 11/01/2018 0851   VLDL 36.0 11/01/2018 0851   LDLCALC 83 11/01/2018 0851    CBC    Component Value Date/Time   WBC 7.4 01/22/2018 0956   RBC 4.92 01/22/2018 0956   HGB 15.0 01/22/2018 0956   HCT 43.6 01/22/2018 0956   PLT 251.0 01/22/2018 0956   MCV 88.7 01/22/2018 0956   MCHC 34.3 01/22/2018 0956   RDW 13.3 01/22/2018 0956   LYMPHSABS 1.6 09/11/2014 0858   MONOABS 0.6 09/11/2014 0858   EOSABS 0.2 09/11/2014 0858   BASOSABS 0.0 09/11/2014 0858    Hgb A1C Lab Results  Component Value Date   HGBA1C 6.2 11/01/2018      Assessment and Plan:   Medicare Annual Wellness Visit:  Diet: He endorses a fair diet Breakfast: Oatmeal, grits Lunch: Take out food, restaurant food. Tries to make healthier choices. Dinner:  Vegetables, take out food, chicken  Snacks: Chips, oatmeal cookie Desserts: Several times weekly Beverages: Water, unsweet tea Physical activity: Sedentary Depression/mood screen: Negative Hearing: Intact to whispered voice Visual acuity: Grossly normal, performs annual eye exam  ADLs: Capable Fall risk: None Home safety: Good Cognitive evaluation: Intact to orientation, naming, recall and repetition EOL planning: Adv directives, full code/ I agree  Preventative Medicine: Tetanus and pneumonia UTD. Rx for Shingrix printed. PSA UTD. Colonoscopy due, referral placed. Encouraged weight loss through diet and exercise. Exam stable. Labs reviewed. All recommendations provided at end of visit.   Next appointment: 6 months.

## 2018-11-02 NOTE — Assessment & Plan Note (Signed)
Recent level of 10.  Rx for Vitamin D 50,000 unit capsules sent to pharmacy. Repeat vitamin D in 6 months.

## 2018-11-02 NOTE — Assessment & Plan Note (Signed)
Recent A1C at goal of 6.2. Encouraged him to continue to work on diet and exercise. Continue glipizide ER 10 mg daily.  He will call to schedule eye exam. Managed on ACE and statin. Foot exam UTD. Pneumonia vaccination UTD.  Follow up in 6 months.

## 2018-11-02 NOTE — Assessment & Plan Note (Signed)
Doing well on tamsulosin. Continue same.

## 2018-11-02 NOTE — Patient Instructions (Signed)
Start taking vitamin D 50,000 unit capsules once weekly for vitamin D.  Start exercising. You should be getting 150 minutes of moderate intensity exercise weekly.  It's important to improve your diet by reducing consumption of fast food, fried food, processed snack foods, sugary drinks. Increase consumption of fresh vegetables and fruits, whole grains, water.  Ensure you are drinking 64 ounces of water daily.  Take the shingles shot to your pharmacy for administration.   Watch your blood pressure and notify me if you get consistent readings at or above 135/90.  Please schedule a follow up appointment in 6 months for diabetes check.   It was a pleasure to see you today!

## 2018-11-02 NOTE — Assessment & Plan Note (Signed)
Tetanus and pneumonia UTD. Rx for Shingrix printed. PSA UTD. Colonoscopy due, referral placed. Encouraged weight loss through diet and exercise. Exam stable. Labs reviewed. All recommendations provided at end of visit.   I have personally reviewed and have noted: 1. The patient's medical and social history 2. Their use of alcohol, tobacco or illicit drugs 3. Their current medications and supplements 4. The patient's functional ability including ADL's, fall risks, home  safety risks and hearing or visual impairment. 5. Diet and physical activities 6. Evidence for depression or mood disorder

## 2018-11-18 ENCOUNTER — Ambulatory Visit (AMBULATORY_SURGERY_CENTER): Payer: Self-pay

## 2018-11-18 ENCOUNTER — Other Ambulatory Visit: Payer: Self-pay

## 2018-11-18 VITALS — Ht 65.0 in | Wt 260.0 lb

## 2018-11-18 DIAGNOSIS — Z1211 Encounter for screening for malignant neoplasm of colon: Secondary | ICD-10-CM

## 2018-11-18 MED ORDER — PEG 3350-KCL-NA BICARB-NACL 420 G PO SOLR: 4000.0000 mL | Freq: Once | ORAL | 0 refills | Status: AC

## 2018-11-18 NOTE — Progress Notes (Signed)
Per pt, no allergies to soy or egg products.Pt not taking any weight loss meds or using  O2 at home.  Pt denies sedation problems.  Pt refused emmi video.  The PV was done over the phone due to the COVID-19. Verified pt's address and insurance. Reviewed pt's medical hx and prep instructions with pt and will mail paperwork to pt. Informed pt to call with any

## 2018-12-01 ENCOUNTER — Telehealth: Payer: Self-pay | Admitting: Gastroenterology

## 2018-12-01 NOTE — Telephone Encounter (Signed)

## 2018-12-02 ENCOUNTER — Other Ambulatory Visit: Payer: Self-pay

## 2018-12-02 ENCOUNTER — Encounter: Payer: Self-pay | Admitting: Gastroenterology

## 2018-12-02 ENCOUNTER — Ambulatory Visit (AMBULATORY_SURGERY_CENTER): Payer: Medicare Other | Admitting: Gastroenterology

## 2018-12-02 VITALS — BP 115/53 | HR 73 | Temp 98.9°F | Resp 28 | Ht 65.0 in | Wt 260.0 lb

## 2018-12-02 DIAGNOSIS — K621 Rectal polyp: Secondary | ICD-10-CM

## 2018-12-02 DIAGNOSIS — Z1211 Encounter for screening for malignant neoplasm of colon: Secondary | ICD-10-CM

## 2018-12-02 DIAGNOSIS — D128 Benign neoplasm of rectum: Secondary | ICD-10-CM

## 2018-12-02 MED ORDER — SODIUM CHLORIDE 0.9 % IV SOLN: 500.0000 mL | Freq: Once | INTRAVENOUS | Status: DC

## 2018-12-02 NOTE — Op Note (Signed)
[facility] Patient Name: Eric Hill Procedure Date: 12/02/2018 8:33 AM MRN: [id number] Endoscopist: Justice Britain , MD Age: 68 Referring MD:  Date of Birth: [birth year] Gender: Male Account #: [id number] Procedure:                Colonoscopy Indications:              Screening for malignant neoplasm in the colon,                            Screening for colorectal malignant neoplasm (last                            colonoscopy was more than 10 years ago) Medicines:                Monitored Anesthesia Care Procedure:                Pre-Anesthesia Assessment:                           - Prior to the procedure, a History and Physical                            was performed, and patient medications and                            allergies were reviewed. The patient's tolerance of                            previous anesthesia was also reviewed. The risks                            and benefits of the procedure and the sedation                            options and risks were discussed with the patient.                            All questions were answered, and informed consent                            was obtained. Prior Anticoagulants: The patient has                            taken no previous anticoagulant or antiplatelet                            agents. ASA Grade Assessment: III - A patient with                            severe systemic disease. After reviewing the risks                            and benefits, the patient was deemed in  satisfactory condition to undergo the procedure.                           After obtaining informed consent, the colonoscope                            was passed under direct vision. Throughout the                            procedure, the patient's blood pressure, pulse, and                            oxygen saturations were monitored continuously. The                            Colonoscope was  introduced through the anus and                            advanced to the 5 cm into the ileum. The                            colonoscopy was performed without difficulty. The                            patient tolerated the procedure. The quality of the                            bowel preparation was good. The terminal ileum,                            ileocecal valve, appendiceal orifice, and rectum                            were photographed. Scope In: 8:43:22 AM Scope Out: 8:58:22 AM Scope Withdrawal Time: 0 hours 11 minutes 1 second  Total Procedure Duration: 0 hours 15 minutes 0 seconds  Findings:                 The digital rectal exam findings include                            hemorrhoids. Pertinent negatives include no                            palpable rectal lesions.                           The terminal ileum and ileocecal valve appeared                            normal.                           A 1 mm polyp was found in the rectum. The polyp was  sessile. The polyp was removed with a cold biopsy                            forceps. Resection and retrieval were complete.                           Many small and large-mouthed diverticula were found                            in the recto-sigmoid colon, sigmoid colon and                            descending colon.                           Normal mucosa was found in the entire colon.                           Non-bleeding non-thrombosed external and internal                            hemorrhoids were found during retroflexion, during                            perianal exam and during digital exam. The                            hemorrhoids were Grade II (internal hemorrhoids                            that prolapse but reduce spontaneously). Complications:            No immediate complications. Estimated Blood Loss:     Estimated blood loss was minimal. Impression:               -  Hemorrhoids found on digital rectal exam.                           - The examined portion of the ileum was normal.                           - One 1 mm polyp in the rectum, removed with a cold                            biopsy forceps. Resected and retrieved.                           - Diverticulosis in the recto-sigmoid colon, in the                            sigmoid colon and in the descending colon.                           - Normal mucosa in the entire examined colon.                           -  Non-bleeding non-thrombosed external and internal                            hemorrhoids. Recommendation:           - The patient will be observed post-procedure,                            until all discharge criteria are met.                           - Discharge patient to home.                           - Patient has a contact number available for                            emergencies. The signs and symptoms of potential                            delayed complications were discussed with the                            patient. Return to normal activities tomorrow.                            Written discharge instructions were provided to the                            patient.                           - High fiber diet.                           - Use fiber, for example Citrucel, Fibercon, Konsyl                            or Metamucil.                           - Continue present medications.                           - Await pathology results.                           - Repeat colonoscopy 7/10 years for surveillance                            based on pathology results and findings of                            adenomatous tissue.                           - If patient desires further workup of his Left  Lumbar discomfort in abdomen, then he can be                            scheduled in clinic and discussion about potential                             further workup including laboratories and possible                            CT-Abdomen/Pelvis.                           - The findings and recommendations were discussed                            with the patient. Justice Britain, MD 12/02/2018 9:06:58 AM

## 2018-12-02 NOTE — Progress Notes (Signed)
Called to room to assist during endoscopic procedure.  Patient ID and intended procedure confirmed with present staff. Received instructions for my participation in the procedure from the performing physician.  

## 2018-12-02 NOTE — Progress Notes (Signed)
Pt's states no medical or surgical changes since previsit or office visit. 

## 2018-12-02 NOTE — Progress Notes (Signed)
Temperature taken by June B., CMA VS taken by Emmit Alexanders, CMA

## 2018-12-02 NOTE — Progress Notes (Signed)
Report given to PACU, vss 

## 2018-12-02 NOTE — Patient Instructions (Signed)
Handouts given for polyps, diverticulosis, hemorrhoids and high fiber diet.  YOU HAD AN ENDOSCOPIC PROCEDURE TODAY AT [facility]:   Refer to the procedure report that was given to you for any specific questions about what was found during the examination.  If the procedure report does not answer your questions, please call your gastroenterologist to clarify.  If you requested that your care partner not be given the details of your procedure findings, then the procedure report has been included in a sealed envelope for you to review at your convenience later.  YOU SHOULD EXPECT: Some feelings of bloating in the abdomen. Passage of more gas than usual.  Walking can help get rid of the air that was put into your GI tract during the procedure and reduce the bloating. If you had a lower endoscopy (such as a colonoscopy or flexible sigmoidoscopy) you may notice spotting of blood in your stool or on the toilet paper. If you underwent a bowel prep for your procedure, you may not have a normal bowel movement for a few days.  Please Note:  You might notice some irritation and congestion in your nose or some drainage.  This is from the oxygen used during your procedure.  There is no need for concern and it should clear up in a day or so.  SYMPTOMS TO REPORT IMMEDIATELY:   Following lower endoscopy (colonoscopy or flexible sigmoidoscopy):  Excessive amounts of blood in the stool  Significant tenderness or worsening of abdominal pains  Swelling of the abdomen that is new, acute  Fever of 100F or higher For urgent or emergent issues, a gastroenterologist can be reached at any hour by calling [phone].   DIET:  We do recommend a small meal at first, but then you may proceed to your regular diet.  Drink plenty of fluids but you should avoid alcoholic beverages for 24 hours.  ACTIVITY:  You should plan to take it easy for the rest of today and you should NOT DRIVE or use heavy machinery  until tomorrow (because of the sedation medicines used during the test).    FOLLOW UP: Our staff will call the number listed on your records 48-72 hours following your procedure to check on you and address any questions or concerns that you may have regarding the information given to you following your procedure. If we do not reach you, we will leave a message.  We will attempt to reach you two times.  During this call, we will ask if you have developed any symptoms of COVID 19. If you develop any symptoms (ie: fever, flu-like symptoms, shortness of breath, cough etc.) before then, please call [phone].  If you test positive for Covid 19 in the 2 weeks post procedure, please call and report this information to [place].    If any biopsies were taken you will be contacted by phone or by letter within the next 1-3 weeks.  Please call us at [phone] if you have not heard about the biopsies in 3 weeks.    SIGNATURES/CONFIDENTIALITY: You and/or your care partner have signed paperwork which will be entered into your electronic medical record.  These signatures attest to the fact that that the information above on your After Visit Summary has been reviewed and is understood.  Full responsibility of the confidentiality of this discharge information lies with you and/or your care-partner.

## 2018-12-06 ENCOUNTER — Telehealth: Payer: Self-pay | Admitting: *Deleted

## 2018-12-06 ENCOUNTER — Encounter: Payer: Self-pay | Admitting: Gastroenterology

## 2018-12-06 NOTE — Telephone Encounter (Signed)
No answer for first attempt at post op follow up call. Will call back later this afternoon. SM

## 2018-12-06 NOTE — Telephone Encounter (Signed)
  Follow up Call-  Call back number 12/02/2018  Post procedure Call Back phone  # [phone]  Permission to leave phone message Yes  Some recent data might be hidden     Patient questions:  Do you have a fever, pain , or abdominal swelling? No. Pain Score  0 *  Have you tolerated food without any problems? Yes.    Have you been able to return to your normal activities? Yes.    Do you have any questions about your discharge instructions: Diet   No. Medications  No. Follow up visit  No.  Do you have questions or concerns about your Care? No.  Actions: * If pain score is 4 or above: No action needed, pain <4.  1. Have you developed a fever since your procedure? no  2.   Have you had an respiratory symptoms (SOB or cough) since your procedure? no  3.   Have you tested positive for COVID 19 since your procedure no  4.   Have you had any family members/close contacts diagnosed with the COVID 19 since your procedure?  no   If yes to any of these questions please route to Joylene John, RN and Alphonsa Gin, Therapist, sports.

## 2018-12-27 ENCOUNTER — Other Ambulatory Visit: Payer: Self-pay | Admitting: Primary Care

## 2018-12-27 DIAGNOSIS — I10 Essential (primary) hypertension: Secondary | ICD-10-CM

## 2019-02-06 ENCOUNTER — Other Ambulatory Visit: Payer: Self-pay | Admitting: Primary Care

## 2019-02-06 DIAGNOSIS — E119 Type 2 diabetes mellitus without complications: Secondary | ICD-10-CM

## 2019-03-30 ENCOUNTER — Other Ambulatory Visit: Payer: Self-pay | Admitting: Primary Care

## 2019-03-30 DIAGNOSIS — R002 Palpitations: Secondary | ICD-10-CM

## 2019-03-30 DIAGNOSIS — R3915 Urgency of urination: Secondary | ICD-10-CM

## 2019-03-31 NOTE — Telephone Encounter (Signed)
Is he taking the metoprolol tartrate medication now? This was originally prescribed by cardiology for palpitations. I thought he mentioned last visit that he wasn't taking this. If he is then how often?

## 2019-04-01 NOTE — Telephone Encounter (Signed)
Spoken and notified patient of Eric Hill comments. Patient stated that he is taking now. He is taking 1 tablet in am and 1/2 tablet in pm.

## 2019-04-03 NOTE — Telephone Encounter (Signed)
Noted, refills sent to pharmacy. 

## 2019-04-12 ENCOUNTER — Other Ambulatory Visit: Payer: Self-pay | Admitting: Primary Care

## 2019-04-12 DIAGNOSIS — F320 Major depressive disorder, single episode, mild: Secondary | ICD-10-CM

## 2019-04-18 ENCOUNTER — Telehealth: Payer: Self-pay | Admitting: Gastroenterology

## 2019-04-18 NOTE — Telephone Encounter (Signed)
Left message for patient to call back to the office;  

## 2019-04-18 NOTE — Telephone Encounter (Signed)
Spoke with pt and he states he is having LUQ abd pain and diarrhea. Diarrhea had stopped but has come back. Pt scheduled to see Alonza Bogus PA 04/20/19@3pm . Pt aware of appt.

## 2019-04-20 ENCOUNTER — Ambulatory Visit: Payer: Medicare Other | Admitting: Gastroenterology

## 2019-04-27 ENCOUNTER — Other Ambulatory Visit: Payer: Self-pay | Admitting: Primary Care

## 2019-04-27 DIAGNOSIS — R002 Palpitations: Secondary | ICD-10-CM

## 2019-05-05 ENCOUNTER — Ambulatory Visit (INDEPENDENT_AMBULATORY_CARE_PROVIDER_SITE_OTHER): Payer: Medicare Other | Admitting: Primary Care

## 2019-05-05 ENCOUNTER — Encounter: Payer: Self-pay | Admitting: Primary Care

## 2019-05-05 ENCOUNTER — Other Ambulatory Visit: Payer: Self-pay

## 2019-05-05 VITALS — BP 131/68 | HR 79

## 2019-05-05 DIAGNOSIS — E119 Type 2 diabetes mellitus without complications: Secondary | ICD-10-CM

## 2019-05-05 DIAGNOSIS — R002 Palpitations: Secondary | ICD-10-CM

## 2019-05-05 NOTE — Progress Notes (Signed)
Subjective:    Patient ID: Eric Hill, male    DOB: December 27, 1950, 69 y.o.   MRN: [id number]  HPI  Virtual Visit via Video Note  I connected with [name] on 05/05/19 at  9:00 AM EST by a video enabled telemedicine application and verified that I am speaking with the correct person using two identifiers.  Location: Patient: Home Provider: Office   I discussed the limitations of evaluation and management by telemedicine and the availability of in person appointments. The patient expressed understanding and agreed to proceed.  History of Present Illness:  Eric Hill is a 69 year old male who presents today for follow up of diabetes.  Current medications include: Glipizide XL 10 mg  He is checking his blood glucose 3 times daily and is getting readings of:  AM fasting: 170-180 Before lunch: 135-145 Before dinner: 130's  Highest reading is 180 just after a meal.  Last A1C: 6.2 in July 2020, A1C pending today Last Eye Exam:  Last Foot Exam: Due Pneumonia Vaccination: Completed in 2019 ACE/ARB: Lisionpril  Statin: atorvastatin     Observations/Objective:  Alert and oriented. Appears well, not sickly. No distress. Speaking in complete sentences.   Assessment and Plan:  See problem based charting.  Follow Up Instructions:  Continue taking Glipizide ER 10 mg once daily for diabetes.   Continue to work on a healthy diet, limit soda/candy as discussed.  Schedule a lab only appointment to come in for diabetes check.  Please schedule a physical with me in 6 months. You may also schedule a lab only appointment 3-4 days prior. We will discuss your lab results in detail during your physical.  It was a pleasure to see you today! [name], NP-C    I discussed the assessment and treatment plan with the patient. The patient was provided an opportunity to ask questions and all were answered. The patient agreed with the plan and demonstrated an  understanding of the instructions.   The patient was advised to call back or seek an in-person evaluation if the symptoms worsen or if the condition fails to improve as anticipated.   Pleas [name], NP    Review of Systems  Eyes: Negative for visual disturbance.  Respiratory: Negative for shortness of breath.   Cardiovascular: Negative for chest pain and palpitations.  Neurological: Negative for dizziness and headaches.       Past Medical History:  Diagnosis Date  . Allergy    seasonal  . Cataract    no surgery yet  . Depression   . Diverticulitis   . Erectile dysfunction   . History of depression   . History of IBS    has abd pain  . Hyperlipidemia   . Hypertension   . Type 2 diabetes mellitus ([place])      Social History   Socioeconomic History  . Marital status: Single    Spouse name: Not on file  . Number of children: Not on file  . Years of education: Not on file  . Highest education level: Not on file  Occupational History  . Not on file  Tobacco Use  . Smoking status: Former Smoker    Quit date: 11/18/1990    Years since quitting: 28.4  . Smokeless tobacco: Never Used  Substance and Sexual Activity  . Alcohol use: Yes    Alcohol/week: 0.0 standard drinks    Comment: rarely  . Drug use: No  . Sexual activity: Not on file  Other Topics Concern  . Not on file  Social History Narrative   Single   Works as a Mudlogger.   No children   Lives in [place]   Enjoys racing cars, Designer, fashion/clothing.   Social Determinants of Health   Financial Resource Strain:   . Difficulty of Paying Living Expenses: Not on file  Food Insecurity:   . Worried About Charity fundraiser in the Last Year: Not on file  . Ran Out of Food in the Last Year: Not on file  Transportation Needs:   . Lack of Transportation (Medical): Not on file  . Lack of Transportation (Non-Medical): Not on file  Physical Activity:   . Days of Exercise per Week: Not on file  .  Minutes of Exercise per Session: Not on file  Stress:   . Feeling of Stress : Not on file  Social Connections:   . Frequency of Communication with Friends and Family: Not on file  . Frequency of Social Gatherings with Friends and Family: Not on file  . Attends Religious Services: Not on file  . Active Member of Clubs or Organizations: Not on file  . Attends Archivist Meetings: Not on file  . Marital Status: Not on file  Intimate Partner Violence:   . Fear of Current or Ex-Partner: Not on file  . Emotionally Abused: Not on file  . Physically Abused: Not on file  . Sexually Abused: Not on file    Past Surgical History:  Procedure Laterality Date  . COLONOSCOPY    . NO PAST SURGERIES      Family History  Problem Relation Age of Onset  . Neuropathy Mother   . Cancer Sister        Back, brain  . Cancer Maternal Grandfather        Abdominal   . Stomach cancer Maternal Grandfather   . Colon cancer Neg Hx   . Esophageal cancer Neg Hx   . Rectal cancer Neg Hx     No Known Allergies  Current Outpatient Medications on File Prior to Visit  Medication Sig Dispense Refill  . atorvastatin (LIPITOR) 20 MG tablet Take 1 tablet (20 mg total) by mouth daily. For cholesterol. 90 tablet 3  . blood glucose meter kit and supplies KIT Dispense based on patient and insurance preference. Use up to 2-3 times daily as directed. Dx is E11.9 1 each 0  . glipiZIDE (GLUCOTROL XL) 10 MG 24 hr tablet TAKE 1 TABLET BY MOUTH IN THE MORNING WITH FOOD FOR DIABETES 90 tablet 1  . lisinopril (ZESTRIL) 20 MG tablet TAKE 1 TABLET BY MOUTH DAILY FOR BLOOD PRESSURE 90 tablet 2  . metoprolol tartrate (LOPRESSOR) 25 MG tablet TAKE 1 TABLET BY MOUTH EVERY MORNING AND 1/2 TABLET BY MOUTH EVERY EVENING. 45 tablet 2  . psyllium (METAMUCIL) 58.6 % powder Take 1 packet by mouth as needed.    . sertraline (ZOLOFT) 50 MG tablet TAKE 1 TABLET BY MOUTH DAILY 90 tablet 1  . tamsulosin (FLOMAX) 0.4 MG CAPS capsule  TAKE 1 CAPSULE BY MOUTH ONCE DAILY WITH BREAKFAST FOR URINE FLOW 90 capsule 1  . Vitamin D, Ergocalciferol, (DRISDOL) 1.25 MG (50000 UT) CAPS capsule Take 1 capsule by mouth once weekly for vitamin D. (Patient not taking: Reported on 11/18/2018) 12 capsule 1   No current facility-administered medications on file prior to visit.    BP 131/68   Pulse 79    Objective:   Physical  Exam  Constitutional: He is oriented to person, place, and time. He appears well-nourished.  Respiratory: Effort normal.  Neurological: He is alert and oriented to person, place, and time.  Psychiatric: He has a normal mood and affect.           Assessment & Plan:

## 2019-05-05 NOTE — Patient Instructions (Signed)
Continue taking Glipizide ER 10 mg once daily for diabetes.   Continue to work on a healthy diet, limit soda/candy as discussed.  Schedule a lab only appointment to come in for diabetes check.  Please schedule a physical with me in 6 months. You may also schedule a lab only appointment 3-4 days prior. We will discuss your lab results in detail during your physical.  It was a pleasure to see you today! [name], NP-C

## 2019-05-05 NOTE — Assessment & Plan Note (Signed)
Improved with metoprolol tartrate on current regimen. Continue same.

## 2019-05-05 NOTE — Assessment & Plan Note (Signed)
Home glucose readings appear okay overall, seems like he's averaging between 130-170.  Repeat A1C pending. Foot exam next visit. He will schedule an eye exam. Pneumonia vaccination UTD.  Follow up in 6 months. Continue glipizide ER 10 mg.

## 2019-05-25 ENCOUNTER — Ambulatory Visit: Payer: Medicare Other | Admitting: Gastroenterology

## 2019-07-15 ENCOUNTER — Other Ambulatory Visit: Payer: Self-pay | Admitting: Primary Care

## 2019-07-15 DIAGNOSIS — E119 Type 2 diabetes mellitus without complications: Secondary | ICD-10-CM

## 2019-07-23 ENCOUNTER — Other Ambulatory Visit: Payer: Self-pay | Admitting: Primary Care

## 2019-07-23 DIAGNOSIS — R002 Palpitations: Secondary | ICD-10-CM

## 2019-09-12 ENCOUNTER — Telehealth: Payer: Self-pay | Admitting: Primary Care

## 2019-09-12 NOTE — Progress Notes (Signed)
  Chronic Care Management   Outreach Note  09/12/2019 Name: Eric Hill MRN: [id number] DOB: 1950-10-07  Referred by: Pleas [name], NP Reason for referral : No chief complaint on file.   An unsuccessful telephone outreach was attempted today. The patient was referred to the pharmacist for assistance with care management and care coordination.   This note is not being shared with the patient for the following reason: To respect privacy (The patient or proxy has requested that the information not be shared).  Follow Up Plan:   [name] Upstream Scheduler

## 2019-09-14 ENCOUNTER — Other Ambulatory Visit: Payer: Self-pay | Admitting: Primary Care

## 2019-09-14 DIAGNOSIS — I10 Essential (primary) hypertension: Secondary | ICD-10-CM

## 2019-09-14 DIAGNOSIS — F320 Major depressive disorder, single episode, mild: Secondary | ICD-10-CM

## 2019-09-15 ENCOUNTER — Telehealth: Payer: Self-pay | Admitting: Primary Care

## 2019-09-15 NOTE — Progress Notes (Signed)
  Chronic Care Management   Note  09/15/2019 Name: Eric Hill MRN: [id number] DOB: 1950-07-23  Eric Hill is a 70 y.o. year old Hill who is a primary care patient of Pleas [name], NP. I reached out to [name] by phone today in response to a referral sent by Eric Hill's PCP, Pleas [name], NP.   Eric Hill was given information about Chronic Care Management services today including:  1. CCM service includes personalized support from designated clinical staff supervised by his physician, including individualized plan of care and coordination with other care providers 2. 24/7 contact phone numbers for assistance for urgent and routine care needs. 3. Service will only be billed when office clinical staff spend 20 minutes or more in a month to coordinate care. 4. Only one practitioner may furnish and bill the service in a calendar month. 5. The patient may stop CCM services at any time (effective at the end of the month) by phone call to the office staff.   Patient agreed to services and verbal consent obtained.   This note is not being shared with the patient for the following reason: To respect privacy (The patient or proxy has requested that the information not be shared).  Follow up plan:   [name] Upstream Scheduler

## 2019-09-20 ENCOUNTER — Other Ambulatory Visit: Payer: Self-pay | Admitting: Primary Care

## 2019-09-20 DIAGNOSIS — E119 Type 2 diabetes mellitus without complications: Secondary | ICD-10-CM

## 2019-09-20 MED ORDER — ATORVASTATIN CALCIUM 20 MG PO TABS: 20.0000 mg | ORAL_TABLET | Freq: Every day | ORAL | 1 refills | Status: DC

## 2019-10-18 ENCOUNTER — Telehealth: Payer: Self-pay

## 2019-10-18 NOTE — Telephone Encounter (Signed)
Pt has not had an eye exam in the past 2 years, he will be scheduling soon, does not need a referral. Pt has upcoming AWV and f/u in July 2021 with PCP

## 2019-10-19 ENCOUNTER — Other Ambulatory Visit: Payer: Self-pay | Admitting: Primary Care

## 2019-10-19 DIAGNOSIS — E119 Type 2 diabetes mellitus without complications: Secondary | ICD-10-CM

## 2019-10-19 DIAGNOSIS — I10 Essential (primary) hypertension: Secondary | ICD-10-CM

## 2019-10-19 DIAGNOSIS — E559 Vitamin D deficiency, unspecified: Secondary | ICD-10-CM

## 2019-10-25 ENCOUNTER — Encounter: Payer: Self-pay | Admitting: Cardiovascular Disease

## 2019-10-25 ENCOUNTER — Telehealth (INDEPENDENT_AMBULATORY_CARE_PROVIDER_SITE_OTHER): Payer: Medicare Other | Admitting: Cardiovascular Disease

## 2019-10-25 DIAGNOSIS — E118 Type 2 diabetes mellitus with unspecified complications: Secondary | ICD-10-CM | POA: Diagnosis not present

## 2019-10-25 DIAGNOSIS — R002 Palpitations: Secondary | ICD-10-CM

## 2019-10-25 DIAGNOSIS — I10 Essential (primary) hypertension: Secondary | ICD-10-CM | POA: Diagnosis not present

## 2019-10-25 DIAGNOSIS — I493 Ventricular premature depolarization: Secondary | ICD-10-CM | POA: Diagnosis not present

## 2019-10-25 MED ORDER — METOPROLOL TARTRATE 25 MG PO TABS: 25.0000 mg | ORAL_TABLET | Freq: Two times a day (BID) | ORAL | 3 refills | Status: DC

## 2019-10-25 NOTE — Patient Instructions (Addendum)
Medication Instructions:  INCREASE YOUR METOPROLOL TARTRATE TO 25MG  TWICE A DAY  *If you need a refill on your cardiac medications before your next appointment, please call your pharmacy*   Follow-Up: At [facility], you and your health needs are our priority.  As part of our continuing mission to provide you with exceptional heart care, we have created designated Provider Care Teams.  These Care Teams include your primary Cardiologist (physician) and Advanced Practice Providers (APPs -  Physician Assistants and Nurse Practitioners) who all work together to provide you with the care you need, when you need it.  We recommend signing up for the patient portal called "MyChart".  Sign up information is provided on this After Visit Summary.  MyChart is used to connect with patients for Virtual Visits (Telemedicine).  Patients are able to view lab/test results, encounter notes, upcoming appointments, etc.  Non-urgent messages can be sent to your provider as well.   To learn more about what you can do with MyChart, go to [email or web address].    Your next appointment:   12 month(s)  The format for your next appointment:   In Person  Provider:   Shelva Majestic, MD

## 2019-10-25 NOTE — Progress Notes (Signed)
Virtual Visit via Telephone Note   This visit type was conducted due to national recommendations for restrictions regarding the COVID-19 Pandemic (e.g. social distancing) in an effort to limit this patient's exposure and mitigate transmission in our community.  Due to his co-morbid illnesses, this patient is at least at moderate risk for complications without adequate follow up.  This format is felt to be most appropriate for this patient at this time.  The patient did not have access to video technology/had technical difficulties with video requiring transitioning to audio format only (telephone).  All issues noted in this document were discussed and addressed.  No physical exam could be performed with this format.  Please refer to the patient's chart for his  consent to telehealth for [facility].   The patient was identified using 2 identifiers.  Date:  10/25/2019   ID:  Eric Hill, DOB 10/10/1950, MRN [id number]  Patient Location: Home Provider Location: Office  PCP:  Pleas [name], NP  Cardiologist:  Electrophysiologist:  None   Evaluation Performed:  Follow-Up Visit  Chief Complaint:  19 month F/IU  History of Present Illness:    Eric Hill is a 69 y.o. male who presents for a 19 month follow up cardiology evaluation.  [name] has a history of  intermittent palpitations which often would be exacerbated during periods of increased stress.  He had worked as a Dealer on trucks and ultimately when additional help was hired the stress at work was less and palpitations improved.    When I initially saw him in 2019 he had noticed more palpitations over the previous 6 months and these were occurring for longer duration.  He denies any associated chest tightness or heaviness.  He was drinking 1 cup of 20 ounce caffeine per day but he is significantly stop caffeine use over the last several weeks.  He sleeps for approximately 5 hours per night.  He snores.  He  denies daytime sleepiness.  He was  seen by Alma Friendly, NP and with his history of hypertension, diabetes mellitus, morbid obesity with increasing palpitations he was now referred for cardiology consultation and evaluation.  I saw him for my initial evaluation January 25, 2018 he was planning to retire.  He has since significantly reduced his work and with the work reduction he has not noticed any significant palpitations and has felt better.  I gave him a prescription for metoprolol tartrate 25 mg to take on an as-needed basis and he has taken this on one occasion since his initial evaluation.  He underwent an echo Doppler study on February 03, 2018 which showed normal LV function.  There is no significant valvular pathology.  He had normal PA pressure.  He wore a react monitor from October 9 through February 16, 2018.  This predominantly showed sinus rhythm with an average rate at 62 bpm.  There were several episodes of sinus tachycardia with rates in the 120s.  He had occasional isolated PVCs and periods of transient trigeminal rhythm.  He did not have any significant pauses or atrial fibrillation.  No high-grade ectopy was detected.  Since his last evaluation with me in November 2019, he has retired.  He denies any significant stress.  His palpitations have stabilized.  He now has been trying to ride a bicycle fairly frequently for exercise.  His blood pressure has been high and at times it has increased up to 160/89.  He has been on metoprolol tartrate 25 mg  in the morning and 12.5 mg in the evening and lisinopril 20 mg.  He is on atorvastatin 20 mg for hyperlipidemia.  He is diabetic on glipizide.  Presently he believes he is sleeping 8 to 9 hours per night and feels well.  He denies any chest pain or significant shortness of breath.  He presents for evaluation  The patient does not have symptoms concerning for COVID-19 infection (fever, chills, cough, or new shortness of breath).    Past Medical  History:  Diagnosis Date  . Allergy    seasonal  . Cataract    no surgery yet  . Depression   . Diverticulitis   . Erectile dysfunction   . History of depression   . History of IBS    has abd pain  . Hyperlipidemia   . Hypertension   . Type 2 diabetes mellitus ([place])    Past Surgical History:  Procedure Laterality Date  . COLONOSCOPY    . NO PAST SURGERIES       Current Meds  Medication Sig  . atorvastatin (LIPITOR) 20 MG tablet Take 1 tablet (20 mg total) by mouth daily. For cholesterol.  . blood glucose meter kit and supplies KIT Dispense based on patient and insurance preference. Use up to 2-3 times daily as directed. Dx is E11.9  . glipiZIDE (GLUCOTROL XL) 10 MG 24 hr tablet TAKE 1 TABLET BY MOUTH IN THE MORNING WITH FOOD FOR DIABETES  . lisinopril (ZESTRIL) 20 MG tablet TAKE 1 TABLET BY MOUTH DAILY FOR BLOOD PRESSURE  . metoprolol tartrate (LOPRESSOR) 25 MG tablet TAKE 1 TABLET BY MOUTH EVERY MORNING AND 1/2 TABLET BY MOUTH EVERY EVENING. (Patient taking differently: Take 1 tablet by mouth every morning)  . psyllium (METAMUCIL) 58.6 % powder Take 1 packet by mouth as needed.  . sertraline (ZOLOFT) 50 MG tablet TAKE 1 TABLET BY MOUTH DAILY  . tamsulosin (FLOMAX) 0.4 MG CAPS capsule TAKE 1 CAPSULE BY MOUTH ONCE DAILY WITH BREAKFAST FOR URINE FLOW  . [DISCONTINUED] Vitamin D, Ergocalciferol, (DRISDOL) 1.25 MG (50000 UT) CAPS capsule Take 1 capsule by mouth once weekly for vitamin D.     Allergies:   Patient has no known allergies.   Social History   Tobacco Use  . Smoking status: Former Smoker    Quit date: 11/18/1990    Years since quitting: 28.9  . Smokeless tobacco: Never Used  Substance Use Topics  . Alcohol use: Yes    Alcohol/week: 0.0 standard drinks    Comment: rarely  . Drug use: No     Family Hx: The patient's family history includes Cancer in his maternal grandfather and sister; Neuropathy in his mother; Stomach cancer in his maternal grandfather. There  is no history of Colon cancer, Esophageal cancer, or Rectal cancer.  ROS:   Please see the history of present illness.    No fevers chills night sweats No cough Significantly less stress now that he is retired Not aware of any significant episodes of palpitations No chest wall tenderness Positive for morbid obesity Recent blood pressure elevation No edema Sleeping well All other systems reviewed and are negative.   Prior CV studies:   The following studies were reviewed today:  ECHO Study Conclusions 02/03/2018 - Procedure narrative: Transthoracic echocardiography. Image  quality was suboptimal. The study was technically difficult.  Intravenous contrast (Definity) was administered to opacify the  LV.  - Left ventricle: The cavity size was normal. Systolic function was  normal. The estimated ejection fraction  was in the range of 55%  to 60%. Wall motion was normal; there were no regional wall  motion abnormalities. The study is not technically sufficient to  allow evaluation of LV diastolic function.  - Aortic valve: Transvalvular velocity was within the normal range.  There was no stenosis. There was trivial regurgitation.  - Mitral valve: Transvalvular velocity was within the normal range.  There was no evidence for stenosis. There was no regurgitation.  - Right ventricle: The cavity size was normal. Wall thickness was  normal. Systolic function was normal.  - Atrial septum: No defect or patent foramen ovale was identified  by color flow Doppler.  - Tricuspid valve: There was trivial regurgitation.  - Pulmonary arteries: Systolic pressure was within the normal  range. PA peak pressure: 25 mm Hg (S).   Monitor Study Highlights The patient was monitored from October 9 through February 16, 2018.  The predominant rhythm was sinus rhythm with the slowest heart rate 47 bpm and the fastest heart rate being sinus tachycardia at 127 bpm.  There were very rare  PACs less than 1% and PVCs at 1%.  There were several episodes of ventricular trigeminy.  There were no couplets.  There were no episodes of ventricular tachycardia or atrial fibrillation.  There were no pauses.     Labs/Other Tests and Data Reviewed:    EKG:  An ECG dated March 18, 2018 was personally reviewed today and demonstrated:  Sinus rhythm with occasional PVCs with retrograde P waves.  QTc interval 423 ms, PR interval 166 ms  Recent Labs: 11/01/2018: ALT 14; BUN 17; Creatinine, Ser 0.81; Potassium 4.5; Sodium 138   Recent Lipid Panel Lab Results  Component Value Date/Time   CHOL 158 11/01/2018 08:51 AM   TRIG 180.0 (H) 11/01/2018 08:51 AM   HDL 39.30 11/01/2018 08:51 AM   CHOLHDL 4 11/01/2018 08:51 AM   LDLCALC 83 11/01/2018 08:51 AM   LDLDIRECT 109.0 12/18/2015 03:23 PM    Wt Readings from Last 3 Encounters:  10/25/19 250 lb (113.4 kg)  12/02/18 260 lb (117.9 kg)  11/18/18 260 lb (117.9 kg)     Objective:    Vital Signs:  BP (!) 144/77 (BP Location: Left Arm, Patient Position: Sitting, Cuff Size: Large)   Pulse 73   Ht '5\' 5"'  (1.651 m)   Wt 250 lb (113.4 kg)   BMI 41.60 kg/m    Since this was a virtual visit I could not physically examine the patient. He states recently his blood pressure had increased to 160/89. Morbid obesity Breathing normal and not labored No wheezing No chest wall pain No abdominal pain Intermittent ankle swelling, improved No neurologic symptoms  ASSESSMENT & PLAN:    1. Essential hypertension: Currently has been lisinopril 20 mg and metoprolol tartrate 25 mg in the morning and 12.5 mg at night.  I reviewed with him new hypertensive guidelines with target blood pressure less than 130/80.  With his remote history of palpitations I have recommended he increase his metoprolol to 25 mg twice a day which should offer improved blood pressure control.  If blood pressure continues to be elevated further titration of lisinopril may be  necessary. 2. Palpitations: I again reviewed his prior monitor.  Palpitations have improved with reduction in stress and with beta-blocker therapy.  As above, metoprolol will be increased for improved blood pressure control and continue ectopy suppression 3. Morbid obesity: BMI 41.6.  I discussed the importance of weight loss.  He states he  is now riding his bike almost every day if possible.  I discussed least 5 days a week of 30 minutes of moderate intensity but also addition of potential 2 days of resistant exercise. 4. Type 2 diabetes mellitus: Currently on glipizide. 5. Hyperlipidemia: Tolerating atorvastatin 20 mg.  LDL cholesterol July 2020 was 83.  He is followed by Dr. Alma Friendly will be rechecking laboratory.  COVID-19 Education: The signs and symptoms of COVID-19 were discussed with the patient and how to seek care for testing (follow up with PCP or arrange E-visit).  The importance of social distancing was discussed today.  Time:   Today, I have spent 20 minutes with the patient with telehealth technology discussing the above problems.     Medication Adjustments/Labs and Tests Ordered: Current medicines are reviewed at length with the patient today.  Concerns regarding medicines are outlined above.   Tests Ordered: No orders of the defined types were placed in this encounter.   Medication Changes: No orders of the defined types were placed in this encounter.   Follow Up:    Signed, Shelva Majestic, MD  10/25/2019 11:13 AM    [place]

## 2019-10-26 ENCOUNTER — Encounter: Payer: Self-pay | Admitting: Cardiovascular Disease

## 2019-11-02 ENCOUNTER — Other Ambulatory Visit: Payer: Self-pay

## 2019-11-03 ENCOUNTER — Telehealth: Payer: Self-pay

## 2019-11-03 ENCOUNTER — Other Ambulatory Visit (INDEPENDENT_AMBULATORY_CARE_PROVIDER_SITE_OTHER): Payer: Medicare Other

## 2019-11-03 ENCOUNTER — Ambulatory Visit: Payer: Medicare Other

## 2019-11-03 ENCOUNTER — Other Ambulatory Visit: Payer: Self-pay

## 2019-11-03 DIAGNOSIS — I10 Essential (primary) hypertension: Secondary | ICD-10-CM | POA: Diagnosis not present

## 2019-11-03 DIAGNOSIS — E559 Vitamin D deficiency, unspecified: Secondary | ICD-10-CM | POA: Diagnosis not present

## 2019-11-03 DIAGNOSIS — E119 Type 2 diabetes mellitus without complications: Secondary | ICD-10-CM

## 2019-11-03 LAB — COMPREHENSIVE METABOLIC PANEL
ALT: 19 U/L (ref 0–53)
AST: 19 U/L (ref 0–37)
Albumin: 4.1 g/dL (ref 3.5–5.2)
Alkaline Phosphatase: 77 U/L (ref 39–117)
BUN: 20 mg/dL (ref 6–23)
CO2: 28 mEq/L (ref 19–32)
Calcium: 9.1 mg/dL (ref 8.4–10.5)
Chloride: 100 mEq/L (ref 96–112)
Creatinine, Ser: 0.88 mg/dL (ref 0.40–1.50)
GFR: 86.48 mL/min (ref >60.00)
Glucose, Bld: 217 mg/dL — ABNORMAL HIGH (ref 70–99)
Potassium: 4.4 mEq/L (ref 3.5–5.1)
Sodium: 136 mEq/L (ref 135–145)
Total Bilirubin: 0.6 mg/dL (ref 0.2–1.2)
Total Protein: 6.9 g/dL (ref 6.0–8.3)

## 2019-11-03 LAB — LIPID PANEL
Cholesterol: 167 mg/dL (ref 0–200)
HDL: 40.4 mg/dL (ref >39.00)
NonHDL: 126.7
Total CHOL/HDL Ratio: 4
Triglycerides: 246 mg/dL — ABNORMAL HIGH (ref 0.0–149.0)
VLDL: 49.2 mg/dL — ABNORMAL HIGH (ref 0.0–40.0)

## 2019-11-03 LAB — CBC
HCT: 43.2 % (ref 39.0–52.0)
Hemoglobin: 14.9 g/dL (ref 13.0–17.0)
MCHC: 34.6 g/dL (ref 30.0–36.0)
MCV: 89.7 fl (ref 78.0–100.0)
Platelets: 246 10*3/uL (ref 150.0–400.0)
RBC: 4.81 Mil/uL (ref 4.22–5.81)
RDW: 13.2 % (ref 11.5–15.5)
WBC: 7.7 10*3/uL (ref 4.0–10.5)

## 2019-11-03 LAB — HEMOGLOBIN A1C: Hgb A1c MFr Bld: 6.6 % — ABNORMAL HIGH (ref 4.6–6.5)

## 2019-11-03 LAB — LDL CHOLESTEROL, DIRECT: Direct LDL: 100 mg/dL

## 2019-11-03 LAB — VITAMIN D 25 HYDROXY (VIT D DEFICIENCY, FRACTURES): VITD: 10.79 ng/mL — ABNORMAL LOW (ref 30.00–100.00)

## 2019-11-03 NOTE — Telephone Encounter (Signed)
Noted  

## 2019-11-03 NOTE — Telephone Encounter (Signed)
FYI- Called patient to complete his Medicare visit. Patient stated that he wanted to complete this at his physical on 11/08/2019. He feels better about completing visits in person rather than over the phone. Appointment cancelled per patient request.

## 2019-11-04 ENCOUNTER — Other Ambulatory Visit: Payer: Self-pay | Admitting: Primary Care

## 2019-11-04 ENCOUNTER — Telehealth: Payer: Self-pay | Admitting: Primary Care

## 2019-11-04 DIAGNOSIS — F320 Major depressive disorder, single episode, mild: Secondary | ICD-10-CM

## 2019-11-04 DIAGNOSIS — R002 Palpitations: Secondary | ICD-10-CM

## 2019-11-04 DIAGNOSIS — R3915 Urgency of urination: Secondary | ICD-10-CM

## 2019-11-04 DIAGNOSIS — I10 Essential (primary) hypertension: Secondary | ICD-10-CM

## 2019-11-04 MED ORDER — SERTRALINE HCL 50 MG PO TABS: 50.0000 mg | ORAL_TABLET | Freq: Every day | ORAL | 1 refills | Status: DC

## 2019-11-04 MED ORDER — TAMSULOSIN HCL 0.4 MG PO CAPS: ORAL_CAPSULE | ORAL | 1 refills | Status: DC

## 2019-11-04 MED ORDER — LISINOPRIL 20 MG PO TABS: 20.0000 mg | ORAL_TABLET | Freq: Every day | ORAL | 1 refills | Status: DC

## 2019-11-04 NOTE — Telephone Encounter (Signed)
Patient called.  Patient has been trying to contact [facility] for 2 hours and they won't pick up.  Patient needs a refill on Lisinopril, Sertraline, and Tamsulosin. Patient said he's out of the Tamsulosin.

## 2019-11-04 NOTE — Telephone Encounter (Signed)
Spoken to patient earlier today. Sent refills as requested.

## 2019-11-08 ENCOUNTER — Ambulatory Visit (INDEPENDENT_AMBULATORY_CARE_PROVIDER_SITE_OTHER): Payer: Medicare Other | Admitting: Primary Care

## 2019-11-08 ENCOUNTER — Encounter: Payer: Self-pay | Admitting: Primary Care

## 2019-11-08 ENCOUNTER — Other Ambulatory Visit: Payer: Self-pay

## 2019-11-08 VITALS — BP 132/82 | HR 66 | Temp 95.8°F | Ht 65.0 in | Wt 266.2 lb

## 2019-11-08 DIAGNOSIS — E119 Type 2 diabetes mellitus without complications: Secondary | ICD-10-CM | POA: Diagnosis not present

## 2019-11-08 DIAGNOSIS — E559 Vitamin D deficiency, unspecified: Secondary | ICD-10-CM

## 2019-11-08 DIAGNOSIS — I10 Essential (primary) hypertension: Secondary | ICD-10-CM | POA: Diagnosis not present

## 2019-11-08 DIAGNOSIS — Z0000 Encounter for general adult medical examination without abnormal findings: Secondary | ICD-10-CM

## 2019-11-08 DIAGNOSIS — F320 Major depressive disorder, single episode, mild: Secondary | ICD-10-CM

## 2019-11-08 DIAGNOSIS — Z23 Encounter for immunization: Secondary | ICD-10-CM

## 2019-11-08 DIAGNOSIS — R3915 Urgency of urination: Secondary | ICD-10-CM

## 2019-11-08 DIAGNOSIS — Z1283 Encounter for screening for malignant neoplasm of skin: Secondary | ICD-10-CM

## 2019-11-08 DIAGNOSIS — R002 Palpitations: Secondary | ICD-10-CM

## 2019-11-08 MED ORDER — VITAMIN D (ERGOCALCIFEROL) 1.25 MG (50000 UNIT) PO CAPS: ORAL_CAPSULE | ORAL | 1 refills | Status: DC

## 2019-11-08 MED ORDER — SERTRALINE HCL 50 MG PO TABS: 75.0000 mg | ORAL_TABLET | Freq: Every day | ORAL | 3 refills | Status: DC

## 2019-11-08 MED ORDER — ATORVASTATIN CALCIUM 20 MG PO TABS: 20.0000 mg | ORAL_TABLET | Freq: Every day | ORAL | 3 refills | Status: DC

## 2019-11-08 NOTE — Assessment & Plan Note (Addendum)
Recent level of 10, no recent vitamin D use.  Rx for Vitamin D 50,000 units sent to pharmacy. Repeat vitamin D in 6 months.

## 2019-11-08 NOTE — Addendum Note (Signed)
Addended by: Jacqualin Combes on: 11/08/2019 04:43 PM   Modules accepted: Orders

## 2019-11-08 NOTE — Patient Instructions (Signed)
Start taking vitamin D 50,000 IU. Take 1 capsule by mouth once weekly.  Please consider the Shingles shots.  Continue exercising. You should be getting 150 minutes of moderate intensity exercise weekly.  It is important that you improve your diet. Please limit carbohydrates in the form of white bread, rice, pasta, sweets, fast food, fried food, sugary drinks, etc. Increase your consumption of fresh fruits and vegetables, whole grains, lean protein.  Ensure you are consuming 64 ounces of water daily.  Please schedule a follow up appointment in 6 months for diabetes check.   It was a pleasure to see you today!   Preventive Care 29 Years and Older, Male Preventive care refers to lifestyle choices and visits with your health care provider that can promote health and wellness. This includes:  A yearly physical exam. This is also called an annual well check.  Regular dental and eye exams.  Immunizations.  Screening for certain conditions.  Healthy lifestyle choices, such as diet and exercise. What can I expect for my preventive care visit? Physical exam Your health care provider will check:  Height and weight. These may be used to calculate body mass index (BMI), which is a measurement that tells if you are at a healthy weight.  Heart rate and blood pressure.  Your skin for abnormal spots. Counseling Your health care provider may ask you questions about:  Alcohol, tobacco, and drug use.  Emotional well-being.  Home and relationship well-being.  Sexual activity.  Eating habits.  History of falls.  Memory and ability to understand (cognition).  Work and work Statistician. What immunizations do I need?  Influenza (flu) vaccine  This is recommended every year. Tetanus, diphtheria, and pertussis (Tdap) vaccine  You may need a Td booster every 10 years. Varicella (chickenpox) vaccine  You may need this vaccine if you have not already been vaccinated. Zoster  (shingles) vaccine  You may need this after age 76. Pneumococcal conjugate (PCV13) vaccine  One dose is recommended after age 51. Pneumococcal polysaccharide (PPSV23) vaccine  One dose is recommended after age 67. Measles, mumps, and rubella (MMR) vaccine  You may need at least one dose of MMR if you were born in 1957 or later. You may also need a second dose. Meningococcal conjugate (MenACWY) vaccine  You may need this if you have certain conditions. Hepatitis A vaccine  You may need this if you have certain conditions or if you travel or work in places where you may be exposed to hepatitis A. Hepatitis B vaccine  You may need this if you have certain conditions or if you travel or work in places where you may be exposed to hepatitis B. Haemophilus influenzae type b (Hib) vaccine  You may need this if you have certain conditions. You may receive vaccines as individual doses or as more than one vaccine together in one shot (combination vaccines). Talk with your health care provider about the risks and benefits of combination vaccines. What tests do I need? Blood tests  Lipid and cholesterol levels. These may be checked every 5 years, or more frequently depending on your overall health.  Hepatitis C test.  Hepatitis B test. Screening  Lung cancer screening. You may have this screening every year starting at age 31 if you have a 30-pack-year history of smoking and currently smoke or have quit within the past 15 years.  Colorectal cancer screening. All adults should have this screening starting at age 71 and continuing until age 67. Your health care  provider may recommend screening at age 37 if you are at increased risk. You will have tests every 1-10 years, depending on your results and the type of screening test.  Prostate cancer screening. Recommendations will vary depending on your family history and other risks.  Diabetes screening. This is done by checking your blood sugar  (glucose) after you have not eaten for a while (fasting). You may have this done every 1-3 years.  Abdominal aortic aneurysm (AAA) screening. You may need this if you are a current or former smoker.  Sexually transmitted disease (STD) testing. Follow these instructions at home: Eating and drinking  Eat a diet that includes fresh fruits and vegetables, whole grains, lean protein, and low-fat dairy products. Limit your intake of foods with high amounts of sugar, saturated fats, and salt.  Take vitamin and mineral supplements as recommended by your health care provider.  Do not drink alcohol if your health care provider tells you not to drink.  If you drink alcohol: ? Limit how much you have to 0-2 drinks a day. ? Be aware of how much alcohol is in your drink. In the [place] one drink equals one 12 oz bottle of beer (355 mL), one 5 oz glass of wine (148 mL), or one 1 oz glass of hard liquor (44 mL). Lifestyle  Take daily care of your teeth and gums.  Stay active. Exercise for at least 30 minutes on 5 or more days each week.  Do not use any products that contain nicotine or tobacco, such as cigarettes, e-cigarettes, and chewing tobacco. If you need help quitting, ask your health care provider.  If you are sexually active, practice safe sex. Use a condom or other form of protection to prevent STIs (sexually transmitted infections).  Talk with your health care provider about taking a low-dose aspirin or statin. What's next?  Visit your health care provider once a year for a well check visit.  Ask your health care provider how often you should have your eyes and teeth checked.  Stay up to date on all vaccines. This information is not intended to replace advice given to you by your health care provider. Make sure you discuss any questions you have with your health care provider. Document Revised: 04/08/2018 Document Reviewed: 04/08/2018 Elsevier Patient Education  2020 [employer].

## 2019-11-08 NOTE — Progress Notes (Signed)
Subjective:    Patient ID: Eric Hill, male    DOB: 1950/11/27, 69 y.o.   MRN: [id number]  HPI  This visit occurred during the SARS-CoV-2 public health emergency.  Safety protocols were in place, including screening questions prior to the visit, additional usage of staff PPE, and extensive cleaning of exam room while observing appropriate contact time as indicated for disinfecting solutions.   Eric Hill is a 69 year old male who presents today for complete physical. He would also like a referral to dermatology to evaluate a few spots on his arms.   Immunizations: -Tetanus: Completed in 2017 -Influenza: Due this season  -Shingles: Never completed, declines  -Pneumonia: Completed Pneumovax in 2019, due for Prevnar  -Covid-19: Declines  Diet: He endorses a healthy  Exercise: He is riding his bicycle several times weekly   Eye exam: Scheduled for August 2021 Dental exam: No recent exam  Colonoscopy: Completed in 2020, due in 2030 PSA: 0.67 in 2020 Hep C Screen: Negative  BP Readings from Last 3 Encounters:  11/08/19 132/82  10/25/19 (!) 144/77  05/05/19 131/68      Review of Systems  Constitutional: Negative for unexpected weight change.  HENT: Negative for rhinorrhea.   Respiratory: Negative for cough and shortness of breath.   Cardiovascular: Negative for chest pain.  Gastrointestinal: Negative for constipation and diarrhea.  Genitourinary: Negative for difficulty urinating.  Musculoskeletal: Negative for arthralgias.  Skin: Negative for rash.  Allergic/Immunologic: Negative for environmental allergies.  Neurological: Negative for dizziness, numbness and headaches.  Psychiatric/Behavioral: The patient is not nervous/anxious.        Past Medical History:  Diagnosis Date  . Allergy    seasonal  . Cataract    no surgery yet  . Depression   . Diverticulitis   . Erectile dysfunction   . History of depression   . History of IBS    has abd pain  .  Hyperlipidemia   . Hypertension   . Type 2 diabetes mellitus ([place])      Social History   Socioeconomic History  . Marital status: Single    Spouse name: Not on file  . Number of children: Not on file  . Years of education: Not on file  . Highest education level: Not on file  Occupational History  . Not on file  Tobacco Use  . Smoking status: Former Smoker    Quit date: 11/18/1990    Years since quitting: 28.9  . Smokeless tobacco: Never Used  Substance and Sexual Activity  . Alcohol use: Yes    Alcohol/week: 0.0 standard drinks    Comment: rarely  . Drug use: No  . Sexual activity: Not on file  Other Topics Concern  . Not on file  Social History Narrative   Single   Works as a Mudlogger.   No children   Lives in [place]   Enjoys racing cars, Designer, fashion/clothing.   Social Determinants of Health   Financial Resource Strain:   . Difficulty of Paying Living Expenses:   Food Insecurity:   . Worried About Charity fundraiser in the Last Year:   . Arboriculturist in the Last Year:   Transportation Needs:   . Film/video editor (Medical):   Marland Kitchen Lack of Transportation (Non-Medical):   Physical Activity:   . Days of Exercise per Week:   . Minutes of Exercise per Session:   Stress:   . Feeling of Stress :  Social Connections:   . Frequency of Communication with Friends and Family:   . Frequency of Social Gatherings with Friends and Family:   . Attends Religious Services:   . Active Member of Clubs or Organizations:   . Attends Archivist Meetings:   Marland Kitchen Marital Status:   Intimate Partner Violence:   . Fear of Current or Ex-Partner:   . Emotionally Abused:   Marland Kitchen Physically Abused:   . Sexually Abused:     Past Surgical History:  Procedure Laterality Date  . COLONOSCOPY    . NO PAST SURGERIES      Family History  Problem Relation Age of Onset  . Neuropathy Mother   . Cancer Sister        Back, brain  . Cancer Maternal Grandfather         Abdominal   . Stomach cancer Maternal Grandfather   . Colon cancer Neg Hx   . Esophageal cancer Neg Hx   . Rectal cancer Neg Hx     No Known Allergies  Current Outpatient Medications on File Prior to Visit  Medication Sig Dispense Refill  . blood glucose meter kit and supplies KIT Dispense based on patient and insurance preference. Use up to 2-3 times daily as directed. Dx is E11.9 1 each 0  . glipiZIDE (GLUCOTROL XL) 10 MG 24 hr tablet TAKE 1 TABLET BY MOUTH IN THE MORNING WITH FOOD FOR DIABETES 90 tablet 1  . lisinopril (ZESTRIL) 20 MG tablet Take 1 tablet (20 mg total) by mouth daily. for blood pressure 90 tablet 1  . metoprolol tartrate (LOPRESSOR) 25 MG tablet Take 1 tablet (25 mg total) by mouth 2 (two) times daily. 180 tablet 3  . psyllium (METAMUCIL) 58.6 % powder Take 1 packet by mouth as needed.    . tamsulosin (FLOMAX) 0.4 MG CAPS capsule TAKE 1 CAPSULE BY MOUTH ONCE DAILY WITH BREAKFAST FOR URINE FLOW 90 capsule 1   No current facility-administered medications on file prior to visit.    BP 132/82   Pulse 66   Temp (!) 95.8 F (35.4 C) (Temporal)   Ht '5\' 5"'$  (1.651 m)   Wt 266 lb 4 oz (120.8 kg)   SpO2 97%   BMI 44.31 kg/m    Objective:   Physical Exam HENT:     Right Ear: Tympanic membrane and ear canal normal.     Left Ear: Tympanic membrane and ear canal normal.  Eyes:     Pupils: Pupils are equal, round, and reactive to light.  Cardiovascular:     Rate and Rhythm: Normal rate and regular rhythm.  Pulmonary:     Effort: Pulmonary effort is normal.     Breath sounds: Normal breath sounds.  Abdominal:     General: Bowel sounds are normal.     Palpations: Abdomen is soft.     Tenderness: There is no abdominal tenderness.  Musculoskeletal:        General: Normal range of motion.     Cervical back: Neck supple.  Skin:    General: Skin is warm and dry.  Neurological:     Mental Status: He is alert and oriented to person, place, and time.     Cranial  Nerves: No cranial nerve deficit.     Deep Tendon Reflexes:     Reflex Scores:      Patellar reflexes are 2+ on the right side and 2+ on the left side.  Assessment & Plan:

## 2019-11-08 NOTE — Assessment & Plan Note (Signed)
Commended him on regular exercise, healthy diet.

## 2019-11-08 NOTE — Assessment & Plan Note (Signed)
Overall feels well managed on Zoloft 75 mg, some bad days but mostly good days.   Continue same. Denies SI/HI.

## 2019-11-08 NOTE — Assessment & Plan Note (Addendum)
Compliant to Glipizide XL 10 mg.  A1C of 6.6 which is an increase from 6.2 one year ago.  Commended him on regular exercise, encouraged a healthy diet.   Managed on ACE-I. Ran out of statin, refilled today. Foot exam today. Eye exam scheduled. Updated pneumonia vaccine.   Continue Glipizide XL 10 mg.  Follow up in 6 months.

## 2019-11-08 NOTE — Assessment & Plan Note (Signed)
Immunizations UTD. PSA UTD. Colonoscopy UTD, due in 2030. Discussed the importance of a healthy diet and regular exercise in order for weight loss, and to reduce the risk of any potential medical problems.  Exam today unremarkable. Labs reviewed.

## 2019-11-08 NOTE — Assessment & Plan Note (Signed)
Stable in the office, home readings lower. Continue metoprolol tartrate and lisinopril 20 mg. CMP reviewed.

## 2019-11-08 NOTE — Assessment & Plan Note (Signed)
Improved since on metoprolol tartrate 25 BID.  Continue same.  Following with cardiology.

## 2019-11-08 NOTE — Assessment & Plan Note (Signed)
Significant improvement on Flomax, continue same.

## 2019-11-21 ENCOUNTER — Ambulatory Visit: Payer: Medicare Other

## 2019-11-21 ENCOUNTER — Other Ambulatory Visit: Payer: Self-pay

## 2019-11-21 DIAGNOSIS — R3915 Urgency of urination: Secondary | ICD-10-CM

## 2019-11-21 DIAGNOSIS — E119 Type 2 diabetes mellitus without complications: Secondary | ICD-10-CM

## 2019-11-21 DIAGNOSIS — I10 Essential (primary) hypertension: Secondary | ICD-10-CM

## 2019-11-21 NOTE — Chronic Care Management (AMB) (Signed)
Chronic Care Management Pharmacy  Name: Eric Hill  MRN: [id number] DOB: 1950/12/01  Chief Complaint/ HPI  Eric Hill,  69 y.o., male presents for their Initial CCM visit with the clinical pharmacist via telephone.  PCP : Pleas [name], NP  Their chronic conditions include: HTN, type 2 DM, ED, vitamin D deficiency, MDD, obesity, urinary urgency   Patient concerns: reports sertraline dose is incorrect and he recently restarted cholesterol medication and vitamin D; feels awful, nausea, fatigue, not sleeping well  Office Visits: 11/08/19: PCP visit - start vitamin D 50,000 IU weekly; DM A1c 6.6 on glipizide, foot exam completed today, eye exam scheduled; cholesterol, ran out of statin, refill today; doing well on sertraline 75 mg daily, BP stable, continue lisinopril and metoprolol   Consult Visit: none in past 6 months  No Known Allergies   Medications: Outpatient Encounter Medications as of 11/21/2019  Medication Sig  . atorvastatin (LIPITOR) 20 MG tablet Take 1 tablet (20 mg total) by mouth daily. For cholesterol.  . blood glucose meter kit and supplies KIT Dispense based on patient and insurance preference. Use up to 2-3 times daily as directed. Dx is E11.9  . glipiZIDE (GLUCOTROL XL) 10 MG 24 hr tablet TAKE 1 TABLET BY MOUTH IN THE MORNING WITH FOOD FOR DIABETES  . lisinopril (ZESTRIL) 20 MG tablet Take 1 tablet (20 mg total) by mouth daily. for blood pressure  . metoprolol tartrate (LOPRESSOR) 25 MG tablet Take 1 tablet (25 mg total) by mouth 2 (two) times daily.  . psyllium (METAMUCIL) 58.6 % powder Take 1 packet by mouth as needed.  . tamsulosin (FLOMAX) 0.4 MG CAPS capsule TAKE 1 CAPSULE BY MOUTH ONCE DAILY WITH BREAKFAST FOR URINE FLOW  . Vitamin D, Ergocalciferol, (DRISDOL) 1.25 MG (50000 UNIT) CAPS capsule Take 1 capsule by mouth once weekly for vitamin D.  . [DISCONTINUED] sertraline (ZOLOFT) 50 MG tablet Take 1.5 tablets (75 mg total) by mouth daily.  For depression.   No facility-administered encounter medications on file as of 11/21/2019.   Current Diagnosis/Assessment:  SDOH Interventions     Most Recent Value  SDOH Interventions  Financial Strain Interventions Intervention Not Indicated     Goals    . Pharmacy Care Plan     CARE PLAN ENTRY (see longitudinal plan of care for additional care plan information)  Current Barriers:  . Chronic Disease Management support, education, and care coordination needs related to Hypertension, Diabetes, and Urinary Urgency   Hypertension BP Readings from Last 3 Encounters:  11/08/19 132/82  10/25/19 (!) 144/77  05/05/19 131/68 .  Pharmacist Clinical Goal(s): Over the next 30 days, patient will work with PharmD and providers to achieve BP goal <140/90 mmHg . Current regimen:   Lisinopril 20 mg - 1 tablet daily  Metoprolol tartrate 25 mg - 1 tablet twice daily (pt taking differently: 1 tablet in AM, 1/2 at night) . Interventions: o Recommend follow up call for blood pressure log once patient is able to resume his medications  . Patient self care activities - Over the next 30 days, patient will: o Continue to check blood pressure twice daily, document, and provide at future appointment for review o Ensure daily salt intake < 2300 mg/day  Cardiovascular Risk Reduction Lab Results  Component Value Date/Time   LDLCALC 83 11/01/2018 08:51 AM   LDLDIRECT 100.0 11/03/2019 09:33 AM .  Pharmacist Clinical Goal(s): o Over the next 30 days, patient will work with PharmD and providers to improve  medication side effects and reduce cardiovascular risk  . Current regimen:  o Atorvastatin 20 mg - 1 tablet daily . Interventions: o Recommend discontinuing atorvastatin due to intolerance (fatigue, nausea). Restore vitamin D levels and then consider starting pravastatin to reduce cardiovascular risk. . Patient self care activities - Over the next 30 days, patient will: o Discontinue atorvastatin   o Continue vitamin D once weekly   Diabetes Lab Results  Component Value Date/Time   HGBA1C 6.6 (H) 11/03/2019 09:33 AM   HGBA1C 6.2 11/01/2018 08:51 AM  Fasting home blood glucose readings: 160s before breakfast, 118-135 before lunch and supper . Pharmacist Clinical Goal(s): o Over the next 30 days, patient will work with PharmD and providers to maintain A1c goal <7% and ensure no hypoglycemia (BG < 70 mg/dL) . Current regimen:   Glipizide 10 mg ER - 1 tablet daily before first meal of the day Interventions: o Recommend eating a high protein and fiber snack before bedtime to prevent overnight hypoglycemia, such as yogurt with no sugar added or cheese and nuts . Patient self care activities - Over the next 30 days, patient will: o Check blood sugar daily first thing in the morning before eating or drinking, document, and provide at future appointment o Contact provider with any episodes of hypoglycemia o Review labels on snacks and choose options with 15-20 grams of carbs with high protein and fiber  Urinary Urgency . Pharmacist Clinical Goal(s) o Over the next 30 days, patient will work with PharmD and providers to improve urinary symptoms including weak stream . Current regimen:  o Tamsulosin 0.4 mg - 1 capsule daily with breakfast . Interventions: o Consult [name] to adjust dosing to 2 capsules daily . Patient self care activities - Over the next 30 days, patient will: o Increase tamsulosin to 2 capsules daily  o Monitor for symptoms improvement over the next 2-3 weeks  Medication management . Pharmacist Clinical Goal(s): o Over the next 30 days, patient will work with PharmD and providers to achieve optimal medication adherence . Current pharmacy: [facility] . Interventions o Comprehensive medication review performed. o Utilize [facility] for medication synchronization, packaging and delivery . Patient self care activities - Over the next 30 days, patient  will: o Coordinate with pharmacist for to have medications synchronized o Call [place] with any questions or concerns o Continue to take medications as prescribed  Initial goal documentation        Hypertension   CMP Latest Ref Rng & Units 11/03/2019 11/01/2018 05/03/2018  Glucose 70 - 99 mg/dL 217(H) 150(H) 146(H)  BUN 6 - 23 mg/dL _0 Creatinine 0.40 - 1.50 mg/dL 0.88 0.81 0.76  Sodium 135 - 145 mEq/L 136 138 136  Potassium 3.5 - 5.1 mEq/L 4.4 4.5 4.3  Chloride 96 - 112 mEq/L 100 103 102  CO2 19 - 32 mEq/L _1 Calcium 8.4 - 10.5 mg/dL 9.1 8.7 9.3  Total Protein 6.0 - 8.3 g/dL 6.9 6.8 -  Total Bilirubin 0.2 - 1.2 mg/dL 0.6 0.5 -  Alkaline Phos 39 - 117 U/L 77 77 -  AST 0 - 37 U/L 19 14 -  ALT 0 - 53 U/L 19 14 -   Office blood pressures are: BP Readings from Last 3 Encounters:  11/08/19 132/82  10/25/19 (!) 144/77  05/05/19 131/68   Patient has failed these meds in the past: none reported Patient checks BP at home: 2x per day, [employer] Cuff Patient  home BP readings are ranging: 160/73, 68 (yesterday - he is currently out of lisinopril and metoprolol for the past 2 days); reports usually 145-160/70-80s in the morning before medications, 125/70-80 in the evenings  BP goal < 140/90 mmHg Patient is currently uncontrolled on the following medications:   Lisinopril 20 mg - 1 tablet daily  Metoprolol tartrate 25 mg - 1 tablet BID (pt taking differently: 1 tablet in AM, 1/2 at night)  We discussed: Reports he has been out of both medications for 2 days, but pharmacy says its too soon to fill per insurance. Pt is unable to verify last refill date because he is using an old bottle. Per dispensing records, he should not be out of medication. Last filled lisinopril 5/19 for 90 DS, metoprolol 6/29 for 90 DS. Reports only taking 1/2 metoprolol at night because higher dose made him oversleep, reports dose was recently increased to 1 tab BID by cardio to reduce morning BP  readings.  Exercise: rides bike when weather is nice, its been about two weeks since last ride; works outside around the house to get some exercise   Reports history of palpitations  Plan: Continue current medications; Refill BP medications as soon as possible (pt reports pharmacy can fill today). Continue to monitor BP twice daily. Reassess BP log in 1 month.  CV Risk Reduction   LDL goal < 100  Lipid Panel     Component Value Date/Time   CHOL 167 11/03/2019 0933   TRIG 246.0 (H) 11/03/2019 0933   HDL 40.40 11/03/2019 0933   LDLCALC 83 11/01/2018 0851   LDLDIRECT 100.0 11/03/2019 0933    Hepatic Function Latest Ref Rng & Units 11/03/2019 11/01/2018 09/22/2017  Total Protein 6.0 - 8.3 g/dL 6.9 6.8 7.6  Albumin 3.5 - 5.2 g/dL 4.1 4.0 4.0  AST 0 - 37 U/L _0 ALT 0 - 53 U/L _1 Alk Phosphatase 39 - 117 U/L 77 77 58  Total Bilirubin 0.2 - 1.2 mg/dL 0.6 0.5 0.6    The 10-year ASCVD risk score Mikey Bussing DC Jr., et al., 2013) is: 29.7%   Values used to calculate the score:     Age: 60 years     Sex: Male     Is Non-Hispanic African American: No     Diabetic: Yes     Tobacco smoker: No     Systolic Blood Pressure: 518 mmHg     Is BP treated: Yes     HDL Cholesterol: 40.4 mg/dL     Total Cholesterol: 167 mg/dL   Patient has failed these meds in past: none  Patient is currently on the following medications:  . Atorvastatin 20 mg - 1 tablet daily  Adherence: Pt just restarted atorvastatin 3-4 days ago and reports feeling awful (nausea, fatigue). Reports he took it for 2-3 weeks at the end of last year and also felt bad. Denies any muscle pain. Reports this his third time retrying this dose and he would like to discontinue.   Assessment: Vitamin D level very low, just started therapy this week which could be contributing to overall fatigue as well as increased side effects with statin. Discussed this with patient and agreed okay to discontinue. Consider trying a different statin  at follow up.   Plan: Stop atorvastatin due to fatigue and nausea. Consider trying a different statin after vitamin D level restored.   Diabetes   Kidney Function Lab Results  Component Value Date/Time   CREATININE  0.88 11/03/2019 09:33 AM   CREATININE 0.81 11/01/2018 08:51 AM   GFR 86.48 11/03/2019 09:33 AM   K 4.4 11/03/2019 09:33 AM   K 4.5 11/01/2018 08:51 AM   Recent Relevant Labs: Lab Results  Component Value Date/Time   HGBA1C 6.6 (H) 11/03/2019 09:33 AM   HGBA1C 6.2 11/01/2018 08:51 AM   MICROALBUR 1.0 12/18/2015 03:23 PM   MICROALBUR 0.8 09/13/2014 09:01 AM   Checking BG: 2x per Day  Reports BG before breakfast: 160s (not checking daily) Reports BG before lunch and before supper: 118-135 (checks daily) Hypoglycemia: reports feeling like he is going to pass out when his BG is < 90, yesterday BG was 85 at 5 PM and felt bad, ate a chocolate candy bar; denies doing any activity yesterday, slept a lot, didn't eat as much as usual; prior to yesterday it had a been a year since BG < 90  A1c goal < 7% Patient has failed these meds in past: metformin (diarrhea) Patient is currently uncontrolled on the following medications:   Glipizide 10 mg ER - 1 tablet daily with food  We discussed: Eats a snack before bed to prevent lows in the morning - usually potato chips, "junk", little debbie raisin cakes. Reports overall appetite has decreased but has not lost any weight. Usually doesn't eat anything until 10-11 AM, soup or can of tuna, eats again 5:30 PM, usually goes to restaurant, gets chicken salad over lettuce with side of peaches  Drinks water only   Assessment: A1c appears accurate (no CKD, anemia), however, fasting BG higher than expected based on A1c. Possible patient is having some hypoglycemia that may be bringing A1c down. He denies any readings < 70. Recommended healthier bedtime snack and checking fasting BG daily to ensure BG is not dropping low overnight.   Plan:  Continue current medications; Recommend dietary changes to reduce carbohydrates and increase protein and fiber. Check blood glucose every morning before breakfast and with any symptoms of low blood sugar.   Depression   Patient has failed these meds in past: none reported Patient is currently controlled on the following medications:   Sertraline 50 mg - 1 and 1/2 tablet (75 mg) daily  We discussed: Pt reports dose was increased to 100 mg incorrectly at most recent refill. Contacted pharmacy and dose was accidentally increased to 100 mg despite prescription stating 75 mg. Patient has been cutting the 100 mg tablet into 3/4 and reports not feeling well. Coordinated with [facility] to have patient receive new prescription for original dose of 75 mg delivered this week.  Plan: Continue current medications   Urinary Urgency    PSA  Date Value Ref Range Status  11/01/2018 0.67 0.10 - 4.00 ng/ml Final    Comment:    Test performed using Access [employer]ch PSA Assay, a parmagnetic partical, chemiluminecent immunoassay.    Patient has failed these meds in past: none reported Patient is currently uncontrolled on the following medications:  . Tamsulosin 0.4 mg - 1 capsule daily with breakfast  We discussed: Pt has noticed slight improvement in weak stream with tamsulosin, reports nighttime frequency has improved a lot, only wakes up once/night to urinate (previously 8-9x per night). He reports interest in trying higher dose to see if improvement in weak stream.   Plan: Continue current medications; Consult PCP to increase tamsulosin to 2 capsules daily. CMA to contact patient for update on symptoms in 3 weeks.  Vitamin D Deficiency   Vitamin D (11/03/19): 11 (low)  Patient has failed these meds in past: none reported Patient is currently uncontrolled on the following medications:   Vitamin D 50,000 units - 1 capsule weekly  We discussed: started first weekly dose on 11/18/19 (Friday)  Plan:  Continue current medications   Medication Management   OTCs: denies vitamins or OTC medications except Metamucil, not taking aspirin; reports taking Metamucil most days to prevent diarrhea, drinks plenty of water [facility] Adherence: Patient reports frustration with running out of medications, hassle of getting medications filled; he is out of multiple medications because insurance too soon to refill Cost: denies concerns  We discussed: Verbal consent obtained for [facility] enhanced pharmacy services (medication synchronization, adherence packaging, delivery coordination). A medication sync plan was created to allow patient to get all medications delivered once every 30 to 90 days per patient preference. Patient understands they have freedom to choose pharmacy and clinical pharmacist will coordinate care between all prescribers and [facility].  Plan: Utilize [facility] for medication coordination, sync and delivery services.   CCM Follow Up: 5 weeks, telephone (01/06/20 at 11 AM); CMA to call in 3 weeks to assess tamsulosin dose change, ask about BP and BG log.   Debbora Dus, PharmD Clinical Pharmacist [facility] [phone]

## 2019-11-22 ENCOUNTER — Telehealth: Payer: Self-pay

## 2019-11-22 DIAGNOSIS — F320 Major depressive disorder, single episode, mild: Secondary | ICD-10-CM

## 2019-11-22 NOTE — Chronic Care Management (AMB) (Signed)
Patient's most recent prescription for sertraline was increased to 100 mg - take 1 and 1/2 tablet daily. He believes this may have been an error. He reports he was doing much better on 50 mg - 1 and 1/2 tablet daily. He has been trying to cut the 100 mg into 3/4 tablets to keep the dose the same since the new prescription was filled. Can we send in a new prescription for the 50 mg - 1 and 1/2 tablet daily to [facility]? Patient would like to utilize [facility] for pharmacy coordination.  Debbora Dus, PharmD Clinical Pharmacist [facility] [phone]

## 2019-11-24 MED ORDER — SERTRALINE HCL 50 MG PO TABS: 75.0000 mg | ORAL_TABLET | Freq: Every day | ORAL | 3 refills | Status: DC

## 2019-11-24 NOTE — Progress Notes (Signed)
[facility] and they had [facility] the wrong dose in error. The patient received 100 mg dose on 7/13 per the dispensing records. They corrected the dose while we were on the call. Could you send a new prescription in to [facility] just in case we run into issues with insurance refilling the 50 mg early? Sorry for the trouble!  Sharyn Lull

## 2019-11-24 NOTE — Telephone Encounter (Signed)
Rx sent to [facility]. 

## 2019-11-24 NOTE — Telephone Encounter (Signed)
Sharyn Lull,  I'm not sure where he's getting the 100 mg dose? My prescription sent on 11/08/19 says sertraline 50 mg, 1 and 1/2 tablet daily. Can you figure out what's happening?

## 2019-11-26 ENCOUNTER — Telehealth: Payer: Self-pay

## 2019-11-26 DIAGNOSIS — R3915 Urgency of urination: Secondary | ICD-10-CM

## 2019-11-26 NOTE — Chronic Care Management (AMB) (Signed)
PCP consult regarding CCM visit on 11/21/19:  Statin side effects: Patient reports restarting atorvastatin 3-4 days ago and experiencing nausea and fatigue. Reports similar experience the last 2 times he tried atorvastatin. We discussed potential for low vitamin D to increase atorvastatin levels and side effects. He plans to discontinue statin for now but is willing to try a different statin once vitamin D levels restored.  Tamsulosin: Patient reports doing well on tamsulosin 1 tablet daily but still having some weak stream. He would like to try increasing to 2 tablets daily to see if any improvement in symptoms. We discussed if no improvement in 2-3 weeks, going back to 1 tablet daily.   Changes pending PCP approval. I have follow up scheduled in 3 weeks.   Debbora Dus, PharmD Clinical Pharmacist [facility] at [facility] [phone]

## 2019-11-26 NOTE — Telephone Encounter (Signed)
Hi Michelle,  Thanks. Can we Try pravastatin instead of Lipitor? This may be more mild and also effective enough against a LDL of 100. Will you find out?  Also, okay to increase Tamsulosin to 2 daily. Let me know if I need to adjust Rx to reflect.

## 2019-11-26 NOTE — Patient Instructions (Addendum)
Dear Eric Hill,  It was a pleasure meeting you during our initial appointment on November 21, 2019. Below is a summary of the goals we discussed and components of chronic care management. Please contact me anytime with questions or concerns.   Visit Information  Goals    . Pharmacy Care Plan     CARE PLAN ENTRY (see longitudinal plan of care for additional care plan information)  Current Barriers:  . Chronic Disease Management support, education, and care coordination needs related to Hypertension, Diabetes, and Urinary Urgency   Hypertension BP Readings from Last 3 Encounters:  11/08/19 132/82  10/25/19 (!) 144/77  05/05/19 131/68 .  Pharmacist Clinical Goal(s): Over the next 30 days, patient will work with PharmD and providers to achieve BP goal <140/90 mmHg . Current regimen:   Lisinopril 20 mg - 1 tablet daily  Metoprolol tartrate 25 mg - 1 tablet twice daily (pt taking differently: 1 tablet in AM, 1/2 at night) . Interventions: o Recommend follow up call for blood pressure log once patient is able to resume his medications  . Patient self care activities - Over the next 30 days, patient will: o Continue to check blood pressure twice daily, document, and provide at future appointment for review o Ensure daily salt intake < 2300 mg/day  Cardiovascular Risk Reduction Lab Results  Component Value Date/Time   LDLCALC 83 11/01/2018 08:51 AM   LDLDIRECT 100.0 11/03/2019 09:33 AM .  Pharmacist Clinical Goal(s): o Over the next 30 days, patient will work with PharmD and providers to improve medication side effects and reduce cardiovascular risk  . Current regimen:  o Atorvastatin 20 mg - 1 tablet daily . Interventions: o Recommend discontinuing atorvastatin due to intolerance (fatigue, nausea). Restore vitamin D levels and then consider starting pravastatin to reduce cardiovascular risk. . Patient self care activities - Over the next 30 days, patient will: o Discontinue  atorvastatin  o Continue vitamin D once weekly   Diabetes Lab Results  Component Value Date/Time   HGBA1C 6.6 (H) 11/03/2019 09:33 AM   HGBA1C 6.2 11/01/2018 08:51 AM  Fasting home blood glucose readings: 160s before breakfast, 118-135 before lunch and supper . Pharmacist Clinical Goal(s): o Over the next 30 days, patient will work with PharmD and providers to maintain A1c goal <7% and ensure no hypoglycemia (BG < 70 mg/dL) . Current regimen:   Glipizide 10 mg ER - 1 tablet daily before first meal of the day Interventions: o Recommend eating a high protein and fiber snack before bedtime to prevent overnight hypoglycemia, such as yogurt with no sugar added or cheese and nuts . Patient self care activities - Over the next 30 days, patient will: o Check blood sugar daily first thing in the morning before eating or drinking, document, and provide at future appointment o Contact provider with any episodes of hypoglycemia o Review labels on snacks and choose options with 15-20 grams of carbs with high protein and fiber  Urinary Urgency . Pharmacist Clinical Goal(s) o Over the next 30 days, patient will work with PharmD and providers to improve urinary symptoms including weak stream . Current regimen:  o Tamsulosin 0.4 mg - 1 capsule daily with breakfast . Interventions: o Consult [name] to adjust dosing to 2 capsules daily . Patient self care activities - Over the next 30 days, patient will: o Increase tamsulosin to 2 capsules daily  o Monitor for symptoms improvement over the next 2-3 weeks  Medication management . Pharmacist Clinical  Goal(s): o Over the next 30 days, patient will work with PharmD and providers to achieve optimal medication adherence . Current pharmacy: [facility] . Interventions o Comprehensive medication review performed. o Utilize [facility] for medication synchronization, packaging and delivery . Patient self care activities - Over the next 30 days,  patient will: o Coordinate with pharmacist for to have medications synchronized o Call [facility] with any questions or concerns o Continue to take medications as prescribed  Initial goal documentation       Eric Hill was given information about Chronic Care Management services today including:  1. CCM service includes personalized support from designated clinical staff supervised by his physician, including individualized plan of care and coordination with other care providers 2. 24/7 contact phone numbers for assistance for urgent and routine care needs. 3. Standard insurance, coinsurance, copays and deductibles apply for chronic care management only during months in which we provide at least 20 minutes of these services. Most insurances cover these services at 100%, however patients may be responsible for any copay, coinsurance and/or deductible if applicable. This service may help you avoid the need for more expensive face-to-face services. 4. Only one practitioner may furnish and bill the service in a calendar month. 5. The patient may stop CCM services at any time (effective at the end of the month) by phone call to the office staff.  Patient agreed to services and verbal consent obtained.   Verbal consent obtained for [facility] enhanced pharmacy services (medication synchronization, adherence packaging, delivery coordination). A medication sync plan was created to allow patient to get all medications delivered once every 30 to 90 days per patient preference. Patient understands they have freedom to choose pharmacy and clinical pharmacist will coordinate care between all prescribers and [facility].  The patient verbalized understanding of instructions provided today and agreed to receive a mailed copy of patient instruction and/or educational materials. Telephone follow up appointment with pharmacy team member scheduled for: January 06, 2020 at 11 AM (telephone  visit)  Debbora Dus, PharmD Clinical Pharmacist [facility] [phone]  Preventing Hypoglycemia Hypoglycemia occurs when the level of sugar (glucose) in the blood is too low. Hypoglycemia can happen in people who do or do not have diabetes (diabetes mellitus). It can develop quickly, and it can be a medical emergency. For most people with diabetes, a blood glucose level below 70 mg/dL (3.9 mmol/L) is considered hypoglycemia. Glucose is a type of sugar that provides the body's main source of energy. Certain hormones (insulin and glucagon) control the level of glucose in the blood. Insulin lowers blood glucose, and glucagon increases blood glucose. Hypoglycemia can result from having too much insulin in the bloodstream, or from not eating enough food that contains glucose. Your risk for hypoglycemia is higher:  If you take insulin or diabetes medicines to help lower your blood glucose or help your body make more insulin.  If you skip or delay a meal or snack.  If you are ill.  During and after exercise. You can prevent hypoglycemia by working with your health care provider to adjust your meal plan as needed and by taking other precautions. How can hypoglycemia affect me? Mild symptoms Mild hypoglycemia may not cause any symptoms. If you do have symptoms, they may include:  Hunger.  Anxiety.  Sweating and feeling clammy.  Dizziness or feeling light-headed.  Sleepiness.  Nausea.  Increased heart rate.  Headache.  Blurry vision.  Irritability.  Tingling or numbness  around the mouth, lips, or tongue.  A change in coordination.  Restless sleep. If mild hypoglycemia is not recognized and treated, it can quickly become moderate or severe hypoglycemia. Moderate symptoms Moderate hypoglycemia can cause:  Mental confusion and poor judgment.  Behavior changes.  Weakness.  Irregular heartbeat. Severe symptoms Severe hypoglycemia is a  medical emergency. It can cause:  Fainting.  Seizures.  Loss of consciousness (coma).  Death. What nutrition changes can be made?  Work with your health care provider or diet and nutrition specialist (dietitian) to make a healthy meal plan that is right for you. Follow your meal plan carefully.  Eat meals at regular times.  If recommended by your health care provider, have snacks between meals.  Donot skip or delay meals or snacks. You can be at risk for hypoglycemia if you are not getting enough carbohydrates. What lifestyle changes can be made?   Work closely with your health care provider to manage your blood glucose. Make sure you know: ? Your goal blood glucose levels. ? How and when to check your blood glucose. ? The symptoms of hypoglycemia. It is important to treat it right away to keep it from becoming severe.  Do not drink alcohol on an empty stomach.  When you are ill, check your blood glucose more often than usual. Follow your sick day plan whenever you cannot eat or drink normally. Make this plan in advance with your health care provider.  Always check your blood glucose before, during, and after exercise. How is this treated? This condition can often be treated by immediately eating or drinking something that contains sugar, such as:  Fruit juice, 4-6 oz (120-150 mL).  Regular (not diet) soda, 4-6 oz (120-150 mL).  Low-fat milk, 4 oz (120 mL).  Several pieces of hard candy.  Sugar or honey, 1 Tbsp (15 mL). Treating hypoglycemia if you have diabetes If you are alert and able to swallow safely, follow the 15:15 rule:  Take 15 grams of a rapid-acting carbohydrate. Talk with your health care provider about how much you should take.  Rapid-acting options include: ? Glucose pills (take 15 grams). ? 6-8 pieces of hard candy. ? 4-6 oz (120-150 mL) of fruit juice. ? 4-6 oz (120-150 mL) of regular (not diet) soda.  Check your blood glucose 15 minutes after  you take the carbohydrate.  If the repeat blood glucose level is still at or below 70 mg/dL (3.9 mmol/L), take 15 grams of a carbohydrate again.  If your blood glucose level does not increase above 70 mg/dL (3.9 mmol/L) after 3 tries, seek emergency medical care.  After your blood glucose level returns to normal, eat a meal or a snack within 1 hour. Treating severe hypoglycemia Severe hypoglycemia is when your blood glucose level is at or below 54 mg/dL (3 mmol/L). Severe hypoglycemia is a medical emergency. Get medical help right away. If you have severe hypoglycemia and you cannot eat or drink, you may need an injection of glucagon. A family member or close friend should learn how to check your blood glucose and how to give you a glucagon injection. Ask your health care provider if you need to have an emergency glucagon injection kit available. Severe hypoglycemia may need to be treated in a hospital. The treatment may include getting glucose through an IV. You may also need treatment for the cause of your hypoglycemia. Where to find more information  American Diabetes Association: www.diabetes.[employer] of Diabetes and Digestive  and Kidney Diseases: [employer] Contact a health care provider if:  You have problems keeping your blood glucose in your target range.  You have frequent episodes of hypoglycemia. Get help right away if:  You continue to have hypoglycemia symptoms after eating or drinking something containing glucose.  Your blood glucose level is at or below 54 mg/dL (3 mmol/L).  You faint.  You have a seizure. These symptoms may represent a serious problem that is an emergency. Do not wait to see if the symptoms will go away. Get medical help right away. Call your local emergency services (911 in the [place] Summary  Know the symptoms of hypoglycemia, and when you are at risk for it (such as during exercise or when you are sick). Check your blood  glucose often when you are at risk for hypoglycemia.  Hypoglycemia can develop quickly, and it can be dangerous if it is not treated right away. If you have a history of severe hypoglycemia, make sure you know how to use your glucagon injection kit.  Make sure you know how to treat hypoglycemia. Keep a carbohydrate snack available when you may be at risk for hypoglycemia. This information is not intended to replace advice given to you by your health care provider. Make sure you discuss any questions you have with your health care provider. Document Revised: 08/06/2018 Document Reviewed: 12/10/2016 Elsevier Patient Education  [place].

## 2019-12-01 MED ORDER — TAMSULOSIN HCL 0.4 MG PO CAPS: 0.8000 mg | ORAL_CAPSULE | Freq: Every day | ORAL | 1 refills | Status: DC

## 2019-12-01 NOTE — Addendum Note (Signed)
Addended by: Pleas [name] on: 12/01/2019 02:40 PM   Modules accepted: Orders

## 2019-12-01 NOTE — Telephone Encounter (Signed)
Noted, Rx for tamsulosin sent to pharmacy. Will discuss pravastatin at next visit. He does need lipid lowering therapy given diabetes.

## 2019-12-01 NOTE — Chronic Care Management (AMB) (Signed)
He is hesitant to start a new statin right now but was agreeable to consider at next visit. Can we hold off on pravastatin until then?  Yes, please send a new prescription for tamsulosin 2 daily to [facility].   Thanks so much,  Sharyn Lull

## 2019-12-07 DIAGNOSIS — H0102A Squamous blepharitis right eye, upper and lower eyelids: Secondary | ICD-10-CM | POA: Diagnosis not present

## 2019-12-07 DIAGNOSIS — H25013 Cortical age-related cataract, bilateral: Secondary | ICD-10-CM | POA: Diagnosis not present

## 2019-12-07 DIAGNOSIS — E119 Type 2 diabetes mellitus without complications: Secondary | ICD-10-CM | POA: Diagnosis not present

## 2019-12-07 DIAGNOSIS — H2513 Age-related nuclear cataract, bilateral: Secondary | ICD-10-CM | POA: Diagnosis not present

## 2019-12-07 DIAGNOSIS — H40013 Open angle with borderline findings, low risk, bilateral: Secondary | ICD-10-CM | POA: Diagnosis not present

## 2019-12-07 LAB — HM DIABETES EYE EXAM

## 2019-12-12 ENCOUNTER — Other Ambulatory Visit: Payer: Self-pay | Admitting: Cardiovascular Disease

## 2019-12-12 ENCOUNTER — Telehealth: Payer: Self-pay

## 2019-12-12 ENCOUNTER — Other Ambulatory Visit: Payer: Self-pay | Admitting: Internal Medicine

## 2019-12-12 DIAGNOSIS — E119 Type 2 diabetes mellitus without complications: Secondary | ICD-10-CM

## 2019-12-12 DIAGNOSIS — R002 Palpitations: Secondary | ICD-10-CM

## 2019-12-12 MED ORDER — GLIPIZIDE ER 10 MG PO TB24: 10.0000 mg | ORAL_TABLET | Freq: Every day | ORAL | 1 refills | Status: DC

## 2019-12-12 MED ORDER — METOPROLOL TARTRATE 25 MG PO TABS: 25.0000 mg | ORAL_TABLET | Freq: Two times a day (BID) | ORAL | 3 refills | Status: DC

## 2019-12-12 NOTE — Telephone Encounter (Signed)
Rx(s) sent to pharmacy electronically.  

## 2019-12-12 NOTE — Telephone Encounter (Signed)
*  STAT* If patient is at the pharmacy, call can be transferred to refill team.   1. Which medications need to be refilled? (please list name of each medication and dose if known) metoprolol tartrate (LOPRESSOR) 25 MG tablet   2. Which pharmacy/location (including street and city if local pharmacy) is medication to be sent to? [facility] - [place], [place] - [place] [address]  3. Do they need a 30 day or 90 day supply? 90 day supply

## 2019-12-12 NOTE — Chronic Care Management (AMB) (Signed)
Gilmer Kaminsky is utilizing [facility] for delivery services.   Please send a refill for glipizide XL 10 mg to [facility] for pharmacy delivery.   Thank you, Sherre Poot, PharmD, [facility] Clinical Pharmacist [facility] [phone] (office) [phone] (mobile)

## 2019-12-13 DIAGNOSIS — L905 Scar conditions and fibrosis of skin: Secondary | ICD-10-CM | POA: Diagnosis not present

## 2019-12-13 DIAGNOSIS — D225 Melanocytic nevi of trunk: Secondary | ICD-10-CM | POA: Diagnosis not present

## 2019-12-13 DIAGNOSIS — L821 Other seborrheic keratosis: Secondary | ICD-10-CM | POA: Diagnosis not present

## 2019-12-16 ENCOUNTER — Encounter: Payer: Self-pay | Admitting: Primary Care

## 2020-01-04 DIAGNOSIS — R509 Fever, unspecified: Secondary | ICD-10-CM | POA: Diagnosis not present

## 2020-01-04 DIAGNOSIS — R21 Rash and other nonspecific skin eruption: Secondary | ICD-10-CM | POA: Diagnosis not present

## 2020-01-05 NOTE — Chronic Care Management (AMB) (Signed)
Chronic Care Management Pharmacy  Name: Eric Hill  MRN: [id number] DOB: [birth year]  Chief Complaint/ HPI  Eric Hill,  69 y.o., male presents for their Follow-Up CCM visit with the clinical pharmacist via telephone.  PCP : Pleas [name], NP  Their chronic conditions include: HTN, type 2 DM, ED, vitamin D deficiency, MDD, obesity, urinary urgency   Patient concerns: reports sertraline dose is incorrect and he recently restarted cholesterol medication and vitamin D; feels awful, nausea, fatigue, not sleeping well  Updates 01/06/2020 since last CCM visit: Patient indicates that he has been exposed to someone with COVID but tested negative. He is feeling bad but on an antibiotic. Encouraged him to return to be tested again. Patient states that he will return for a test if he feels worse.   Office Visits: 11/08/19: PCP visit - start vitamin D 50,000 IU weekly; DM A1c 6.6 on glipizide, foot exam completed today, eye exam scheduled; cholesterol, ran out of statin, refill today; doing well on sertraline 75 mg daily, BP stable, continue lisinopril and metoprolol   Consult Visit: none in past 6 months  No Known Allergies   Medications: Outpatient Encounter Medications as of 01/06/2020  Medication Sig  . lisinopril (ZESTRIL) 20 MG tablet Take 1 tablet (20 mg total) by mouth daily. for blood pressure  . metoprolol tartrate (LOPRESSOR) 25 MG tablet Take 1 tablet (25 mg total) by mouth 2 (two) times daily.  . sertraline (ZOLOFT) 50 MG tablet Take 1.5 tablets (75 mg total) by mouth daily. For depression.  . tamsulosin (FLOMAX) 0.4 MG CAPS capsule Take 2 capsules (0.8 mg total) by mouth daily. FOR URINE FLOW (Patient taking differently: Take 0.4 mg by mouth daily. FOR URINE FLOW - patient has resumed once daily dosing.)  . Vitamin D, Ergocalciferol, (DRISDOL) 1.25 MG (50000 UNIT) CAPS capsule Take 1 capsule by mouth once weekly for vitamin D.  . atorvastatin (LIPITOR) 20 MG  tablet Take 1 tablet (20 mg total) by mouth daily. For cholesterol.  . blood glucose meter kit and supplies KIT Dispense based on patient and insurance preference. Use up to 2-3 times daily as directed. Dx is E11.9  . glipiZIDE (GLUCOTROL XL) 10 MG 24 hr tablet Take 1 tablet (10 mg total) by mouth daily with breakfast.  . psyllium (METAMUCIL) 58.6 % powder Take 1 packet by mouth as needed.   No facility-administered encounter medications on file as of 01/06/2020.   Current Diagnosis/Assessment:   Goals    . Pharmacy Care Plan     CARE PLAN ENTRY (see longitudinal plan of care for additional care plan information)  Current Barriers:  . Chronic Disease Management support, education, and care coordination needs related to Hypertension, Diabetes, and Urinary Urgency   Hypertension BP Readings from Last 3 Encounters:  11/08/19 132/82  10/25/19 (!) 144/77  05/05/19 131/68 .  Pharmacist Clinical Goal(s): Over the next 30 days, patient will work with PharmD and providers to achieve BP goal <140/90 mmHg . Current regimen:   Lisinopril 20 mg - 1 tablet daily  Metoprolol tartrate 25 mg - 1 tablet twice daily . Interventions: o Reviewed blood pressure log and discussed proper technique for checking blood pressure.  o Reviewed patient's medication list. . Patient self care activities - Over the next 30 days, patient will: o Recommend patient resume biking 3 times weekly now that temperatures have improved.  o Continue to check blood pressure twice daily, document, and provide at future appointment for review o  Ensure daily salt intake < 2300 mg/day  Cardiovascular Risk Reduction Lab Results  Component Value Date/Time   LDLCALC 83 11/01/2018 08:51 AM   LDLDIRECT 100.0 11/03/2019 09:33 AM .  Pharmacist Clinical Goal(s): o Over the next 30 days, patient will work with PharmD and providers to improve medication side effects and reduce cardiovascular risk  . Current regimen:  o Atorvastatin  20 mg - 1 tablet daily . Interventions: o Recommend discontinuing atorvastatin due to intolerance (fatigue, nausea). Restore vitamin D levels and then consider starting pravastatin to reduce cardiovascular risk. . Patient self care activities - Over the next 30 days, patient will: o Discontinue atorvastatin  o Continue vitamin D once weekly   Diabetes Lab Results  Component Value Date/Time   HGBA1C 6.6 (H) 11/03/2019 09:33 AM   HGBA1C 6.2 11/01/2018 08:51 AM  Fasting home blood glucose readings: 160s before breakfast, 118-135 before lunch and supper . Pharmacist Clinical Goal(s): o Over the next 30 days, patient will work with PharmD and providers to maintain A1c goal <7% and ensure no hypoglycemia (BG < 70 mg/dL) . Current regimen:   Glipizide 10 mg ER - 1 tablet daily before first meal of the day Interventions: o Recommend eating a high protein and fiber snack before bedtime to prevent overnight hypoglycemia, such as yogurt with no sugar added or cheese and nuts . Patient self care activities - Over the next 30 days, patient will: o Check blood sugar daily first thing in the morning before eating or drinking, document, and provide at future appointment o Contact provider with any episodes of hypoglycemia o Review labels on snacks and choose options with 15-20 grams of carbs with high protein and fiber  Urinary Urgency . Pharmacist Clinical Goal(s) o Over the next 30 days, patient will work with PharmD and providers to improve urinary symptoms including weak stream . Current regimen:  o Tamsulosin 0.4 mg - 1 capsule daily with breakfast . Interventions: o Discussed patient reports 2 capsule daily dose did not improve symptoms and he didn't feel well so he resumed daily.  . Patient self care activities - Over the next 30 days, patient will: o Continue tamsulosin 0.4 mg daily o Monitor for symptoms improvement over the next 2-3 weeks  Medication management . Pharmacist Clinical  Goal(s): o Over the next 30 days, patient will work with PharmD and providers to achieve optimal medication adherence . Current pharmacy: [facility] . Interventions o Comprehensive medication review performed. o Utilize [facility] for medication synchronization, packaging and delivery . Patient self care activities - Over the next 30 days, patient will: o Coordinate with pharmacist for to have medications synchronized o Call [place] with any questions or concerns o Continue to take medications as prescribed  Please see past updates related to this goal by clicking on the "Past Updates" button in the selected goal         Hypertension   CMP Latest Ref Rng & Units 11/03/2019 11/01/2018 05/03/2018  Glucose 70 - 99 mg/dL 217(H) 150(H) 146(H)  BUN 6 - 23 mg/dL _0 Creatinine 0.40 - 1.50 mg/dL 0.88 0.81 0.76  Sodium 135 - 145 mEq/L 136 138 136  Potassium 3.5 - 5.1 mEq/L 4.4 4.5 4.3  Chloride 96 - 112 mEq/L 100 103 102  CO2 19 - 32 mEq/L _1 Calcium 8.4 - 10.5 mg/dL 9.1 8.7 9.3  Total Protein 6.0 - 8.3 g/dL 6.9 6.8 -  Total Bilirubin 0.2 - 1.2 mg/dL  0.6 0.5 -  Alkaline Phos 39 - 117 U/L 77 77 -  AST 0 - 37 U/L 19 14 -  ALT 0 - 53 U/L 19 14 -   Office blood pressures are: BP Readings from Last 3 Encounters:  11/08/19 132/82  10/25/19 (!) 144/77  05/05/19 131/68   Patient has failed these meds in the past: none reported Patient checks BP at home: 2x per day, [employer] Arm Cuff Patient home BP readings are ranging: 160/73, 68 (yesterday - he is currently out of lisinopril and metoprolol for the past 2 days); reports usually 145-160/70-80s in the morning before medications, 125/70-80 in the evenings  BP goal < 140/90 mmHg Patient is currently uncontrolled on the following medications:   Lisinopril 20 mg - 1 tablet daily  Metoprolol tartrate 25 mg - 1 tablet BID (pt taking differently: 1 tablet in AM, 1/2 at night)  We discussed: Reports he has been out of both  medications for 2 days, but pharmacy says its too soon to fill per insurance. Pt is unable to verify last refill date because he is using an old bottle. Per dispensing records, he should not be out of medication. Last filled lisinopril 5/19 for 90 DS, metoprolol 6/29 for 90 DS. Reports only taking 1/2 metoprolol at night because higher dose made him oversleep, reports dose was recently increased to 1 tab BID by cardio to reduce morning BP readings.  Exercise: rides bike when weather is nice, its been about two weeks since last ride; works outside around the house to get some exercise   Reports history of palpitations  Update 01/06/2020 - Today 156/83 pulse 73 before medication. Blood pressure has been 146/77, 159/83, 165/90 mmHg. At the doctor this week when COVID testing was 144/72 mmHg. Patient reports blood pressure stays 135-140/70 mmHg when riding bike regularly. Patient reports getting cuff as tight as possible to check blood pressure at home. Pharmacist counseled on appropriate steps to take blood pressure accurately. Patient acknowledges understanding and will continue to check and report back to pharmacist in 2 weeks.   Adherence: Patient reports no problems with [facility] delivery and has plenty of medication. Denies missed doses. BP comes down in the afternoon.  .   Plan: Continue current medications; Reassess blood pressure readings in 2 weeks.   CV Risk Reduction   LDL goal < 100  Lipid Panel     Component Value Date/Time   CHOL 167 11/03/2019 0933   TRIG 246.0 (H) 11/03/2019 0933   HDL 40.40 11/03/2019 0933   LDLCALC 83 11/01/2018 0851   LDLDIRECT 100.0 11/03/2019 0933    Hepatic Function Latest Ref Rng & Units 11/03/2019 11/01/2018 09/22/2017  Total Protein 6.0 - 8.3 g/dL 6.9 6.8 7.6  Albumin 3.5 - 5.2 g/dL 4.1 4.0 4.0  AST 0 - 37 U/L _0 ALT 0 - 53 U/L _1 Alk Phosphatase 39 - 117 U/L 77 77 58  Total Bilirubin 0.2 - 1.2 mg/dL 0.6 0.5 0.6    The 10-year ASCVD  risk score Mikey Bussing DC Jr., et al., 2013) is: 29.7%   Values used to calculate the score:     Age: 28 years     Sex: Male     Is Non-Hispanic African American: No     Diabetic: Yes     Tobacco smoker: No     Systolic Blood Pressure: 811 mmHg     Is BP treated: Yes     HDL Cholesterol: 40.4  mg/dL     Total Cholesterol: 167 mg/dL   Patient has failed these meds in past: none  Patient is currently on the following medications:  . Atorvastatin 20 mg - 1 tablet daily - stopped due to side effects. Resume therapy once Vitamin D therapeutic.   Adherence: Pt just restarted atorvastatin 3-4 days ago and reports feeling awful (nausea, fatigue). Reports he took it for 2-3 weeks at the end of last year and also felt bad. Denies any muscle pain. Reports this his third time retrying this dose and he would like to discontinue.   Assessment: Vitamin D level very low, just started therapy this week which could be contributing to overall fatigue as well as increased side effects with statin. Discussed this with patient and agreed okay to discontinue. Consider trying a different statin at follow up.   Update 01/06/2020 - Patient reports the vitamin D supplementation hurts his stomach but he is taking. Hoping to resume bike riding soon to benefit blood sugar and heart health. Has not resumed atorvastatin.    Plan: Stop atorvastatin due to fatigue and nausea. Consider trying a different statin after vitamin D level restored. Begin riding bike 3 days each week now that weather is cooler.   Diabetes   Kidney Function Lab Results  Component Value Date/Time   CREATININE 0.88 11/03/2019 09:33 AM   CREATININE 0.81 11/01/2018 08:51 AM   GFR 86.48 11/03/2019 09:33 AM   K 4.4 11/03/2019 09:33 AM   K 4.5 11/01/2018 08:51 AM   Recent Relevant Labs: Lab Results  Component Value Date/Time   HGBA1C 6.6 (H) 11/03/2019 09:33 AM   HGBA1C 6.2 11/01/2018 08:51 AM   MICROALBUR 1.0 12/18/2015 03:23 PM   MICROALBUR 0.8  09/13/2014 09:01 AM   Checking BG: 2x per Day  Reports BG before breakfast: 160s (not checking daily) Reports BG before lunch and before supper: 118-135 (checks daily) Hypoglycemia: reports feeling like he is going to pass out when his BG is < 90, yesterday BG was 85 at 5 PM and felt bad, ate a chocolate candy bar; denies doing any activity yesterday, slept a lot, didn't eat as much as usual; prior to yesterday it had a been a year since BG < 90  A1c goal < 7% Patient has failed these meds in past: metformin (diarrhea) Patient is currently uncontrolled on the following medications:   Glipizide 10 mg ER - 1 tablet daily with food  We discussed: Eats a snack before bed to prevent lows in the morning - usually potato chips, "junk", little debbie raisin cakes. Reports overall appetite has decreased but has not lost any weight. Usually doesn't eat anything until 10-11 AM, soup or can of tuna, eats again 5:30 PM, usually goes to restaurant, gets chicken salad over lettuce with side of peaches  Drinks water only   Assessment: A1c appears accurate (no CKD, anemia), however, fasting BG higher than expected based on A1c. Possible patient is having some hypoglycemia that may be bringing A1c down. He denies any readings < 70. Recommended healthier bedtime snack and checking fasting BG daily to ensure BG is not dropping low overnight.   Update 01/06/2020 - Patient has been exposed to friend with [place] and has symptoms of COVID but tested negative shortly after exposure. Discussed potential of testing too soon. Encouraged patient to monitor symptoms and seek help if symptoms worsen.   Blood sugar is staying around the same. Fasting blood sugar is around 160 mg/dL but drops down to  135 after gets up and eating. He has some readings of 118-127 when exercising. Plans to resume riding his bike in a few weeks once he begins feeling better and weather is cooling. [place] [employer] [place]. He plans to buy  a bike trainer for the Winter. Rides around 5 miles/day 3 days a week.   Plan: Continue current medications.   Urinary Urgency    PSA  Date Value Ref Range Status  11/01/2018 0.67 0.10 - 4.00 ng/ml Final    Comment:    Test performed using [employer] PSA Assay, a parmagnetic partical, chemiluminecent immunoassay.    Patient has failed these meds in past: none reported Patient is currently uncontrolled on the following medications:  . Tamsulosin 0.4 mg - 1 capsule daily with breakfast  We discussed: Pt has noticed slight improvement in weak stream with tamsulosin, reports nighttime frequency has improved a lot, only wakes up once/night to urinate (previously 8-9x per night). He reports interest in trying higher dose to see if improvement in weak stream.   Update 01/06/2020 - He took Tamsulosin twice daily for a few days but didn't help and he returned to once daily dosing. He prefers to resume once daily because lack of benefit and didn't feel his best while on increased dose.   Plan: Continue current medications;  Vitamin D Deficiency   Vitamin D (11/03/19): 11 (low) Patient has failed these meds in past: none reported Patient is currently uncontrolled on the following medications:   Vitamin D 50,000 units - 1 capsule weekly  We discussed: started first weekly dose on 11/18/19 (Friday)  Update 01/06/2020 - Patient reports medication hurts his stomach. He plans to schedule GI visit once COVID symptoms subside.   Plan: Continue current medications   Medication Management   OTCs: denies vitamins or OTC medications except Metamucil, not taking aspirin; reports taking Metamucil most days to prevent diarrhea, drinks plenty of water [facility] Adherence: Patient reports frustration with running out of medications, hassle of getting medications filled; he is out of multiple medications because insurance too soon to refill Cost: denies concerns  We discussed: Verbal  consent obtained for [facility] enhanced pharmacy services (medication synchronization, adherence packaging, delivery coordination). A medication sync plan was created to allow patient to get all medications delivered once every 30 to 90 days per patient preference. Patient understands they have freedom to choose pharmacy and clinical pharmacist will coordinate care between all prescribers and [facility].  Plan: Utilize [facility] for medication coordination, sync and delivery services.   CCM Follow Up: 2 weeks, telephone (01/20/20 at 10:30 AM); Assess blood pressure log and resolution of COVID like symptoms.   Sherre Poot, PharmD, [facility] Clinical Pharmacist [facility] [phone] (office) [phone] (mobile)

## 2020-01-06 ENCOUNTER — Other Ambulatory Visit: Payer: Self-pay

## 2020-01-06 ENCOUNTER — Ambulatory Visit: Payer: Medicare Other

## 2020-01-06 DIAGNOSIS — I10 Essential (primary) hypertension: Secondary | ICD-10-CM

## 2020-01-06 DIAGNOSIS — E119 Type 2 diabetes mellitus without complications: Secondary | ICD-10-CM

## 2020-01-08 NOTE — Patient Instructions (Addendum)
Visit Information  Goals Addressed            This Visit's Progress   . Pharmacy Care Plan       CARE PLAN ENTRY (see longitudinal plan of care for additional care plan information)  Current Barriers:  . Chronic Disease Management support, education, and care coordination needs related to Hypertension, Diabetes, and Urinary Urgency   Hypertension BP Readings from Last 3 Encounters:  11/08/19 132/82  10/25/19 (!) 144/77  05/05/19 131/68 .  Pharmacist Clinical Goal(s): Over the next 30 days, patient will work with PharmD and providers to achieve BP goal <140/90 mmHg . Current regimen:   Lisinopril 20 mg - 1 tablet daily  Metoprolol tartrate 25 mg - 1 tablet twice daily . Interventions: o Reviewed blood pressure log and discussed proper technique for checking blood pressure.  o Reviewed patient's medication list. . Patient self care activities - Over the next 30 days, patient will: o Recommend patient resume biking 3 times weekly now that temperatures have improved.  o Continue to check blood pressure twice daily, document, and provide at future appointment for review o Ensure daily salt intake < 2300 mg/day  Cardiovascular Risk Reduction Lab Results  Component Value Date/Time   LDLCALC 83 11/01/2018 08:51 AM   LDLDIRECT 100.0 11/03/2019 09:33 AM .  Pharmacist Clinical Goal(s): o Over the next 30 days, patient will work with PharmD and providers to improve medication side effects and reduce cardiovascular risk  . Current regimen:  o Atorvastatin 20 mg - 1 tablet daily . Interventions: o Recommend discontinuing atorvastatin due to intolerance (fatigue, nausea). Restore vitamin D levels and then consider starting pravastatin to reduce cardiovascular risk. . Patient self care activities - Over the next 30 days, patient will: o Discontinue atorvastatin  o Continue vitamin D once weekly   Diabetes Lab Results  Component Value Date/Time   HGBA1C 6.6 (H) 11/03/2019 09:33 AM    HGBA1C 6.2 11/01/2018 08:51 AM  Fasting home blood glucose readings: 160s before breakfast, 118-135 before lunch and supper . Pharmacist Clinical Goal(s): o Over the next 30 days, patient will work with PharmD and providers to maintain A1c goal <7% and ensure no hypoglycemia (BG < 70 mg/dL) . Current regimen:   Glipizide 10 mg ER - 1 tablet daily before first meal of the day Interventions: o Recommend eating a high protein and fiber snack before bedtime to prevent overnight hypoglycemia, such as yogurt with no sugar added or cheese and nuts . Patient self care activities - Over the next 30 days, patient will: o Check blood sugar daily first thing in the morning before eating or drinking, document, and provide at future appointment o Contact provider with any episodes of hypoglycemia o Review labels on snacks and choose options with 15-20 grams of carbs with high protein and fiber  Urinary Urgency . Pharmacist Clinical Goal(s) o Over the next 30 days, patient will work with PharmD and providers to improve urinary symptoms including weak stream . Current regimen:  o Tamsulosin 0.4 mg - 1 capsule daily with breakfast . Interventions: o Discussed patient reports 2 capsule daily dose did not improve symptoms and he didn't feel well so he resumed daily.  . Patient self care activities - Over the next 30 days, patient will: o Continue tamsulosin 0.4 mg daily o Monitor for symptoms improvement over the next 2-3 weeks  Medication management . Pharmacist Clinical Goal(s): o Over the next 30 days, patient will work with PharmD and providers  to achieve optimal medication adherence . Current pharmacy: [facility] . Interventions o Comprehensive medication review performed. o Utilize [facility] for medication synchronization, packaging and delivery . Patient self care activities - Over the next 30 days, patient will: o Coordinate with pharmacist for to have medications  synchronized o Call [facility] with any questions or concerns o Continue to take medications as prescribed  Please see past updates related to this goal by clicking on the "Past Updates" button in the selected goal        The patient verbalized understanding of instructions provided today and declined a print copy of patient instruction materials.   Telephone follow up appointment with pharmacy team member scheduled for:01/20/2020 @ 10:30 am   Sherre Poot, PharmD, [facility] Clinical Pharmacist [facility] [phone] (office) [phone] (mobile)   DASH Eating Plan DASH stands for "Dietary Approaches to Stop Hypertension." The DASH eating plan is a healthy eating plan that has been shown to reduce high blood pressure (hypertension). It may also reduce your risk for type 2 diabetes, heart disease, and stroke. The DASH eating plan may also help with weight loss. What are tips for following this plan?  General guidelines  Avoid eating more than 2,300 mg (milligrams) of salt (sodium) a day. If you have hypertension, you may need to reduce your sodium intake to 1,500 mg a day.  Limit alcohol intake to no more than 1 drink a day for nonpregnant women and 2 drinks a day for men. One drink equals 12 oz of beer, 5 oz of wine, or 1 oz of hard liquor.  Work with your health care provider to maintain a healthy body weight or to lose weight. Ask what an ideal weight is for you.  Get at least 30 minutes of exercise that causes your heart to beat faster (aerobic exercise) most days of the week. Activities may include walking, swimming, or biking.  Work with your health care provider or diet and nutrition specialist (dietitian) to adjust your eating plan to your individual calorie needs. Reading food labels   Check food labels for the amount of sodium per serving. Choose foods with less than 5 percent of the Daily Value of sodium. Generally, foods with less than 300 mg of sodium per  serving fit into this eating plan.  To find whole grains, look for the word "whole" as the first word in the ingredient list. Shopping  Buy products labeled as "low-sodium" or "no salt added."  Buy fresh foods. Avoid canned foods and premade or frozen meals. Cooking  Avoid adding salt when cooking. Use salt-free seasonings or herbs instead of table salt or sea salt. Check with your health care provider or pharmacist before using salt substitutes.  Do not fry foods. Cook foods using healthy methods such as baking, boiling, grilling, and broiling instead.  Cook with heart-healthy oils, such as olive, canola, soybean, or sunflower oil. Meal planning  Eat a balanced diet that includes: ? 5 or more servings of fruits and vegetables each day. At each meal, try to fill half of your plate with fruits and vegetables. ? Up to 6-8 servings of whole grains each day. ? Less than 6 oz of lean meat, poultry, or fish each day. A 3-oz serving of meat is about the same size as a deck of cards. One egg equals 1 oz. ? 2 servings of low-fat dairy each day. ? A serving of nuts, seeds, or beans 5 times each week. ? Heart-healthy fats.  Healthy fats called Omega-3 fatty acids are found in foods such as flaxseeds and coldwater fish, like sardines, salmon, and mackerel.  Limit how much you eat of the following: ? Canned or prepackaged foods. ? Food that is high in trans fat, such as fried foods. ? Food that is high in saturated fat, such as fatty meat. ? Sweets, desserts, sugary drinks, and other foods with added sugar. ? Full-fat dairy products.  Do not salt foods before eating.  Try to eat at least 2 vegetarian meals each week.  Eat more home-cooked food and less restaurant, buffet, and fast food.  When eating at a restaurant, ask that your food be prepared with less salt or no salt, if possible. What foods are recommended? The items listed may not be a complete list. Talk with your dietitian about  what dietary choices are best for you. Grains Whole-grain or whole-wheat bread. Whole-grain or whole-wheat pasta. Cydne Grahn rice. Modena Morrow. Bulgur. Whole-grain and low-sodium cereals. Pita bread. Low-fat, low-sodium crackers. Whole-wheat flour tortillas. Vegetables Fresh or frozen vegetables (raw, steamed, roasted, or grilled). Low-sodium or reduced-sodium tomato and vegetable juice. Low-sodium or reduced-sodium tomato sauce and tomato paste. Low-sodium or reduced-sodium canned vegetables. Fruits All fresh, dried, or frozen fruit. Canned fruit in natural juice (without added sugar). Meat and other protein foods Skinless chicken or [place]. Ground chicken or [place]. Pork with fat trimmed off. Fish and seafood. Egg whites. Dried beans, peas, or lentils. Unsalted nuts, nut butters, and seeds. Unsalted canned beans. Lean cuts of beef with fat trimmed off. Low-sodium, lean deli meat. Dairy Low-fat (1%) or fat-free (skim) milk. Fat-free, low-fat, or reduced-fat cheeses. Nonfat, low-sodium ricotta or cottage cheese. Low-fat or nonfat yogurt. Low-fat, low-sodium cheese. Fats and oils Soft margarine without trans fats. Vegetable oil. Low-fat, reduced-fat, or light mayonnaise and salad dressings (reduced-sodium). Canola, safflower, olive, soybean, and sunflower oils. Avocado. Seasoning and other foods Herbs. Spices. Seasoning mixes without salt. Unsalted popcorn and pretzels. Fat-free sweets. What foods are not recommended? The items listed may not be a complete list. Talk with your dietitian about what dietary choices are best for you. Grains Baked goods made with fat, such as croissants, muffins, or some breads. Dry pasta or rice meal packs. Vegetables Creamed or fried vegetables. Vegetables in a cheese sauce. Regular canned vegetables (not low-sodium or reduced-sodium). Regular canned tomato sauce and paste (not low-sodium or reduced-sodium). Regular tomato and vegetable juice (not low-sodium or  reduced-sodium). Angie Fava. Olives. Fruits Canned fruit in a light or heavy syrup. Fried fruit. Fruit in cream or butter sauce. Meat and other protein foods Fatty cuts of meat. Ribs. Fried meat. Berniece Salines. Sausage. Bologna and other processed lunch meats. Salami. Fatback. Hotdogs. Bratwurst. Salted nuts and seeds. Canned beans with added salt. Canned or smoked fish. Whole eggs or egg yolks. Chicken or [place] with skin. Dairy Whole or 2% milk, cream, and half-and-half. Whole or full-fat cream cheese. Whole-fat or sweetened yogurt. Full-fat cheese. Nondairy creamers. Whipped toppings. Processed cheese and cheese spreads. Fats and oils Butter. Stick margarine. Lard. Shortening. Ghee. Bacon fat. Tropical oils, such as coconut, palm kernel, or palm oil. Seasoning and other foods Salted popcorn and pretzels. Onion salt, garlic salt, seasoned salt, table salt, and sea salt. Worcestershire sauce. Tartar sauce. Barbecue sauce. Teriyaki sauce. Soy sauce, including reduced-sodium. Steak sauce. Canned and packaged gravies. Fish sauce. Oyster sauce. Cocktail sauce. Horseradish that you find on the shelf. Ketchup. Mustard. Meat flavorings and tenderizers. Bouillon cubes. Hot sauce and Tabasco sauce. Premade or  packaged marinades. Premade or packaged taco seasonings. Relishes. Regular salad dressings. Where to find more information:  National Heart, Lung, and [place]: https://wilson-eaton.com/  American Heart Association: www.heart.org Summary  The DASH eating plan is a healthy eating plan that has been shown to reduce high blood pressure (hypertension). It may also reduce your risk for type 2 diabetes, heart disease, and stroke.  With the DASH eating plan, you should limit salt (sodium) intake to 2,300 mg a day. If you have hypertension, you may need to reduce your sodium intake to 1,500 mg a day.  When on the DASH eating plan, aim to eat more fresh fruits and vegetables, whole grains, lean proteins, low-fat  dairy, and heart-healthy fats.  Work with your health care provider or diet and nutrition specialist (dietitian) to adjust your eating plan to your individual calorie needs. This information is not intended to replace advice given to you by your health care provider. Make sure you discuss any questions you have with your health care provider. Document Revised: 03/27/2017 Document Reviewed: 04/07/2016 Elsevier Patient Education  2020 [employer].

## 2020-01-12 ENCOUNTER — Other Ambulatory Visit: Payer: Self-pay | Admitting: Primary Care

## 2020-01-12 DIAGNOSIS — E119 Type 2 diabetes mellitus without complications: Secondary | ICD-10-CM

## 2020-01-13 ENCOUNTER — Other Ambulatory Visit: Payer: Self-pay

## 2020-01-13 ENCOUNTER — Ambulatory Visit: Payer: Medicare Other

## 2020-01-13 DIAGNOSIS — E119 Type 2 diabetes mellitus without complications: Secondary | ICD-10-CM

## 2020-01-13 DIAGNOSIS — I10 Essential (primary) hypertension: Secondary | ICD-10-CM

## 2020-01-13 NOTE — Patient Instructions (Addendum)
Visit Information  Goals Addressed            This Visit's Progress   . Pharmacy Care Plan       CARE PLAN ENTRY (see longitudinal plan of care for additional care plan information)  Current Barriers:  . Chronic Disease Management support, education, and care coordination needs related to Hypertension, Diabetes, and Urinary Urgency   Hypertension BP Readings from Last 3 Encounters:  11/08/19 132/82  10/25/19 (!) 144/77  05/05/19 131/68 .  Pharmacist Clinical Goal(s): Over the next 30 days, patient will work with PharmD and providers to achieve BP goal <140/90 mmHg . Current regimen:   Lisinopril 20 mg - 1 tablet daily  Metoprolol tartrate 25 mg - 1 tablet twice daily . Interventions: o Reviewed blood pressure log and discussed proper technique for checking blood pressure.  o Reviewed patient's medication list. . Patient self care activities - Over the next 30 days, patient will: o Recommend patient resume biking 3 times weekly now that temperatures have improved.  o Continue to check blood pressure twice daily, document, and provide at future appointment for review o Ensure daily salt intake < 2300 mg/day  Cardiovascular Risk Reduction Lab Results  Component Value Date/Time   LDLCALC 83 11/01/2018 08:51 AM   LDLDIRECT 100.0 11/03/2019 09:33 AM .  Pharmacist Clinical Goal(s): o Over the next 30 days, patient will work with PharmD and providers to improve medication side effects and reduce cardiovascular risk  . Current regimen:  o Atorvastatin 20 mg - 1 tablet daily . Interventions: o Recommend discontinuing atorvastatin due to intolerance (fatigue, nausea). Restore vitamin D levels and then consider starting pravastatin to reduce cardiovascular risk. . Patient self care activities - Over the next 30 days, patient will: o Discontinue atorvastatin  o Continue vitamin D once weekly   Diabetes Lab Results  Component Value Date/Time   HGBA1C 6.6 (H) 11/03/2019 09:33 AM    HGBA1C 6.2 11/01/2018 08:51 AM  Fasting home blood glucose readings: 160s before breakfast, 118-135 before lunch and supper . Pharmacist Clinical Goal(s): o Over the next 30 days, patient will work with PharmD and providers to maintain A1c goal <7% and ensure no hypoglycemia (BG < 70 mg/dL) . Current regimen:   Glipizide 10 mg ER - 1 tablet daily before first meal of the day Interventions: o Recommend eating a high protein and fiber snack before bedtime to prevent overnight hypoglycemia, such as yogurt with no sugar added or cheese and nuts o Reviewed blood sugar readings. Discussed elevated blood sugars recently. Will follow-up in 2 weeks to determine if improving with diet/exercise.  . Patient self care activities - Over the next 30 days, patient will: o Check blood sugar daily first thing in the morning before eating or drinking, document, and provide at future appointment o Contact provider with any episodes of hypoglycemia o Review labels on snacks and choose options with 15-20 grams of carbs with high protein and fiber  Urinary Urgency . Pharmacist Clinical Goal(s) o Over the next 30 days, patient will work with PharmD and providers to improve urinary symptoms including weak stream . Current regimen:  o Tamsulosin 0.4 mg - 1 capsule daily with breakfast . Interventions: o Discussed patient reports 2 capsule daily dose did not improve symptoms and he didn't feel well so he resumed daily.  . Patient self care activities - Over the next 30 days, patient will: o Continue tamsulosin 0.4 mg daily o Monitor for symptoms improvement over the next  2-3 weeks  Medication management . Pharmacist Clinical Goal(s): o Over the next 30 days, patient will work with PharmD and providers to achieve optimal medication adherence . Current pharmacy: [facility] . Interventions o Comprehensive medication review performed. o Utilize [facility] pharmacy for medication synchronization, packaging and  delivery . Patient self care activities - Over the next 30 days, patient will: o Coordinate with pharmacist for to have medications synchronized o Call [facility] with any questions or concerns o Continue to take medications as prescribed  Please see past updates related to this goal by clicking on the "Past Updates" button in the selected goal        The patient verbalized understanding of instructions provided today and declined a print copy of patient instruction materials.   Telephone follow up appointment with pharmacy team member scheduled for: 2 weeks  Sherre Poot, PharmD, [facility] Clinical Pharmacist [facility] [phone] (office) [phone] (mobile)   Carbohydrate Counting for Diabetes Mellitus, Adult  Carbohydrate counting is a method of keeping track of how many carbohydrates you eat. Eating carbohydrates naturally increases the amount of sugar (glucose) in the blood. Counting how many carbohydrates you eat helps keep your blood glucose within normal limits, which helps you manage your diabetes (diabetes mellitus). It is important to know how many carbohydrates you can safely have in each meal. This is different for every person. A diet and nutrition specialist (registered dietitian) can help you make a meal plan and calculate how many carbohydrates you should have at each meal and snack. Carbohydrates are found in the following foods:  Grains, such as breads and cereals.  Dried beans and soy products.  Starchy vegetables, such as potatoes, peas, and corn.  Fruit and fruit juices.  Milk and yogurt.  Sweets and snack foods, such as cake, cookies, candy, chips, and soft drinks. How do I count carbohydrates? There are two ways to count carbohydrates in food. You can use either of the methods or a combination of both. Reading "Nutrition Facts" on packaged food The "Nutrition Facts" list is included on the labels of almost all packaged foods and beverages  in the [place] It includes:  The serving size.  Information about nutrients in each serving, including the grams (g) of carbohydrate per serving. To use the "Nutrition Facts":  Decide how many servings you will have.  Multiply the number of servings by the number of carbohydrates per serving.  The resulting number is the total amount of carbohydrates that you will be having. Learning standard serving sizes of other foods When you eat carbohydrate foods that are not packaged or do not include "Nutrition Facts" on the label, you need to measure the servings in order to count the amount of carbohydrates:  Measure the foods that you will eat with a food scale or measuring cup, if needed.  Decide how many standard-size servings you will eat.  Multiply the number of servings by 15. Most carbohydrate-rich foods have about 15 g of carbohydrates per serving. ? For example, if you eat 8 oz (170 g) of strawberries, you will have eaten 2 servings and 30 g of carbohydrates (2 servings x 15 g = 30 g).  For foods that have more than one food mixed, such as soups and casseroles, you must count the carbohydrates in each food that is included. The following list contains standard serving sizes of common carbohydrate-rich foods. Each of these servings has about 15 g of carbohydrates:   hamburger bun or  [place]  muffin.   oz (15 mL) syrup.   oz (14 g) jelly.  1 slice of bread.  1 six-inch tortilla.  3 oz (85 g) cooked rice or pasta.  4 oz (113 g) cooked dried beans.  4 oz (113 g) starchy vegetable, such as peas, corn, or potatoes.  4 oz (113 g) hot cereal.  4 oz (113 g) mashed potatoes or  of a large baked potato.  4 oz (113 g) canned or frozen fruit.  4 oz (120 mL) fruit juice.  4-6 crackers.  6 chicken nuggets.  6 oz (170 g) unsweetened dry cereal.  6 oz (170 g) plain fat-free yogurt or yogurt sweetened with artificial sweeteners.  8 oz (240 mL) milk.  8 oz (170 g) fresh  fruit or one small piece of fruit.  24 oz (680 g) popped popcorn. Example of carbohydrate counting Sample meal  3 oz (85 g) chicken breast.  6 oz (170 g) Adilson Grafton rice.  4 oz (113 g) corn.  8 oz (240 mL) milk.  8 oz (170 g) strawberries with sugar-free whipped topping. Carbohydrate calculation 1. Identify the foods that contain carbohydrates: ? Rice. ? Corn. ? Milk. ? Strawberries. 2. Calculate how many servings you have of each food: ? 2 servings rice. ? 1 serving corn. ? 1 serving milk. ? 1 serving strawberries. 3. Multiply each number of servings by 15 g: ? 2 servings rice x 15 g = 30 g. ? 1 serving corn x 15 g = 15 g. ? 1 serving milk x 15 g = 15 g. ? 1 serving strawberries x 15 g = 15 g. 4. Add together all of the amounts to find the total grams of carbohydrates eaten: ? 30 g + 15 g + 15 g + 15 g = 75 g of carbohydrates total. Summary  Carbohydrate counting is a method of keeping track of how many carbohydrates you eat.  Eating carbohydrates naturally increases the amount of sugar (glucose) in the blood.  Counting how many carbohydrates you eat helps keep your blood glucose within normal limits, which helps you manage your diabetes.  A diet and nutrition specialist (registered dietitian) can help you make a meal plan and calculate how many carbohydrates you should have at each meal and snack. This information is not intended to replace advice given to you by your health care provider. Make sure you discuss any questions you have with your health care provider. Document Revised: 11/06/2016 Document Reviewed: 09/26/2015 Elsevier Patient Education  [place].

## 2020-01-13 NOTE — Chronic Care Management (AMB) (Signed)
Chronic Care Management Pharmacy  Name: Eric Hill  MRN: [id number] DOB: Aug 30, 1950  Chief Complaint/ HPI  Eric Hill,  69 y.o., male presents for their Follow-Up CCM visit with the clinical pharmacist via telephone.  PCP : Eric [name], NP  Their chronic conditions include: HTN, type 2 DM, ED, vitamin D deficiency, MDD, obesity, urinary urgency   Patient concerns: reports sertraline dose is incorrect and he recently restarted cholesterol medication and vitamin D; feels awful, nausea, fatigue, not sleeping well  Update 01/13/2020 since last CCM visit:  O2 level fluctuating form 94-97% throughout the day. Still very low energy. Having stomach pain and has a history of stomach cancer in his family. He wants to see about this once he gets over his illness. Fever has resolved and feeling some better.   Office Visits: 11/08/19: PCP visit - start vitamin D 50,000 IU weekly; DM A1c 6.6 on glipizide, foot exam completed today, eye exam scheduled; cholesterol, ran out of statin, refill today; doing well on sertraline 75 mg daily, BP stable, continue lisinopril and metoprolol   Consult Visit: none in past 6 months  No Known Allergies   Medications: Outpatient Encounter Medications as of 01/13/2020  Medication Sig  . atorvastatin (LIPITOR) 20 MG tablet Take 1 tablet (20 mg total) by mouth daily. For cholesterol.  . blood glucose meter kit and supplies KIT Dispense based on patient and insurance preference. Use up to 2-3 times daily as directed. Dx is E11.9  . glipiZIDE (GLUCOTROL XL) 10 MG 24 hr tablet Take 1 tablet (10 mg total) by mouth daily with breakfast.  . lisinopril (ZESTRIL) 20 MG tablet Take 1 tablet (20 mg total) by mouth daily. for blood pressure  . metoprolol tartrate (LOPRESSOR) 25 MG tablet Take 1 tablet (25 mg total) by mouth 2 (two) times daily.  . psyllium (METAMUCIL) 58.6 % powder Take 1 packet by mouth as needed.  . sertraline (ZOLOFT) 50 MG tablet  Take 1.5 tablets (75 mg total) by mouth daily. For depression.  . tamsulosin (FLOMAX) 0.4 MG CAPS capsule Take 2 capsules (0.8 mg total) by mouth daily. FOR URINE FLOW (Patient taking differently: Take 0.4 mg by mouth daily. FOR URINE FLOW - patient has resumed once daily dosing.)  . Vitamin D, Ergocalciferol, (DRISDOL) 1.25 MG (50000 UNIT) CAPS capsule Take 1 capsule by mouth once weekly for vitamin D.   No facility-administered encounter medications on file as of 01/13/2020.   No Known Allergies  SDOH Screenings   Alcohol Screen:   . Last Alcohol Screening Score (AUDIT): Not on file  Depression (PHQ2-9):   . PHQ-2 Score: Not on file  Financial Resource Strain: Low Risk   . Difficulty of Paying Living Expenses: Not very hard  Food Insecurity:   . Worried About Charity fundraiser in the Last Year: Not on file  . Ran Out of Food in the Last Year: Not on file  Housing:   . Last Housing Risk Score: Not on file  Physical Activity:   . Days of Exercise per Week: Not on file  . Minutes of Exercise per Session: Not on file  Social Connections:   . Frequency of Communication with Friends and Family: Not on file  . Frequency of Social Gatherings with Friends and Family: Not on file  . Attends Religious Services: Not on file  . Active Member of Clubs or Organizations: Not on file  . Attends Archivist Meetings: Not on file  .  Marital Status: Not on file  Stress:   . Feeling of Stress : Not on file  Tobacco Use: Medium Risk  . Smoking Tobacco Use: Former Smoker  . Smokeless Tobacco Use: Never Used  Transportation Needs:   . Film/video editor (Medical): Not on file  . Lack of Transportation (Non-Medical): Not on file     Current Diagnosis/Assessment:   Goals    . Pharmacy Care Plan     CARE PLAN ENTRY (see longitudinal plan of care for additional care plan information)  Current Barriers:  . Chronic Disease Management support, education, and care coordination  needs related to Hypertension, Diabetes, and Urinary Urgency   Hypertension BP Readings from Last 3 Encounters:  11/08/19 132/82  10/25/19 (!) 144/77  05/05/19 131/68 .  Pharmacist Clinical Goal(s): Over the next 30 days, patient will work with PharmD and providers to achieve BP goal <140/90 mmHg . Current regimen:   Lisinopril 20 mg - 1 tablet daily  Metoprolol tartrate 25 mg - 1 tablet twice daily . Interventions: o Reviewed blood pressure log and discussed proper technique for checking blood pressure.  o Reviewed patient's medication list. . Patient self care activities - Over the next 30 days, patient will: o Recommend patient resume biking 3 times weekly now that temperatures have improved.  o Continue to check blood pressure twice daily, document, and provide at future appointment for review o Ensure daily salt intake < 2300 mg/day  Cardiovascular Risk Reduction Lab Results  Component Value Date/Time   LDLCALC 83 11/01/2018 08:51 AM   LDLDIRECT 100.0 11/03/2019 09:33 AM .  Pharmacist Clinical Goal(s): o Over the next 30 days, patient will work with PharmD and providers to improve medication side effects and reduce cardiovascular risk  . Current regimen:  o Atorvastatin 20 mg - 1 tablet daily . Interventions: o Recommend discontinuing atorvastatin due to intolerance (fatigue, nausea). Restore vitamin D levels and then consider starting pravastatin to reduce cardiovascular risk. . Patient self care activities - Over the next 30 days, patient will: o Discontinue atorvastatin  o Continue vitamin D once weekly   Diabetes Lab Results  Component Value Date/Time   HGBA1C 6.6 (H) 11/03/2019 09:33 AM   HGBA1C 6.2 11/01/2018 08:51 AM  Fasting home blood glucose readings: 160s before breakfast, 118-135 before lunch and supper . Pharmacist Clinical Goal(s): o Over the next 30 days, patient will work with PharmD and providers to maintain A1c goal <7% and ensure no hypoglycemia (BG  < 70 mg/dL) . Current regimen:   Glipizide 10 mg ER - 1 tablet daily before first meal of the day Interventions: o Recommend eating a high protein and fiber snack before bedtime to prevent overnight hypoglycemia, such as yogurt with no sugar added or cheese and nuts o Reviewed blood sugar readings. Discussed elevated blood sugars recently. Will follow-up in 2 weeks to determine if improving with diet/exercise.  . Patient self care activities - Over the next 30 days, patient will: o Check blood sugar daily first thing in the morning before eating or drinking, document, and provide at future appointment o Contact provider with any episodes of hypoglycemia o Review labels on snacks and choose options with 15-20 grams of carbs with high protein and fiber  Urinary Urgency . Pharmacist Clinical Goal(s) o Over the next 30 days, patient will work with PharmD and providers to improve urinary symptoms including weak stream . Current regimen:  o Tamsulosin 0.4 mg - 1 capsule daily with breakfast . Interventions: o  Discussed patient reports 2 capsule daily dose did not improve symptoms and he didn't feel well so he resumed daily.  . Patient self care activities - Over the next 30 days, patient will: o Continue tamsulosin 0.4 mg daily o Monitor for symptoms improvement over the next 2-3 weeks  Medication management . Pharmacist Clinical Goal(s): o Over the next 30 days, patient will work with PharmD and providers to achieve optimal medication adherence . Current pharmacy: [facility] . Interventions o Comprehensive medication review performed. o Utilize [facility] for medication synchronization, packaging and delivery . Patient self care activities - Over the next 30 days, patient will: o Coordinate with pharmacist for to have medications synchronized o Call [facility] with any questions or concerns o Continue to take medications as prescribed  Please see past updates related to this goal  by clicking on the "Past Updates" button in the selected goal         Hypertension   CMP Latest Ref Rng & Units 11/03/2019 11/01/2018 05/03/2018  Glucose 70 - 99 mg/dL 217(H) 150(H) 146(H)  BUN 6 - 23 mg/dL '20 17 22  ' Creatinine 0.40 - 1.50 mg/dL 0.88 0.81 0.76  Sodium 135 - 145 mEq/L 136 138 136  Potassium 3.5 - 5.1 mEq/L 4.4 4.5 4.3  Chloride 96 - 112 mEq/L 100 103 102  CO2 19 - 32 mEq/L '28 28 27  ' Calcium 8.4 - 10.5 mg/dL 9.1 8.7 9.3  Total Protein 6.0 - 8.3 g/dL 6.9 6.8 -  Total Bilirubin 0.2 - 1.2 mg/dL 0.6 0.5 -  Alkaline Phos 39 - 117 U/L 77 77 -  AST 0 - 37 U/L 19 14 -  ALT 0 - 53 U/L 19 14 -   Office blood pressures are: BP Readings from Last 3 Encounters:  11/08/19 132/82  10/25/19 (!) 144/77  05/05/19 131/68   Patient has failed these meds in the past: none reported Patient checks BP at home: 2x per day, [employer]uff Patient home BP readings are ranging: 160/73, 68 (yesterday - he is currently out of lisinopril and metoprolol for the past 2 days); reports usually 145-160/70-80s in the morning before medications, 125/70-80 in the evenings  BP goal < 140/90 mmHg Patient is currently controlled on the following medications:   Lisinopril 20 mg - 1 tablet daily  Metoprolol tartrate 25 mg - 1 tablet BID (pt taking differently: 1 tablet in AM, 1/2 at night)   Update 01/13/2020 - Patient purchased a bike trainer this week to be able to exercise  and has been getting out of the house working in his shop. Blood pressure this week has been: ~127-135/70-80 pulse ~70.  .   Plan: Continue current medications;    Diabetes   Kidney Function Lab Results  Component Value Date/Time   CREATININE 0.88 11/03/2019 09:33 AM   CREATININE 0.81 11/01/2018 08:51 AM   GFR 86.48 11/03/2019 09:33 AM   K 4.4 11/03/2019 09:33 AM   K 4.5 11/01/2018 08:51 AM   Recent Relevant Labs: Lab Results  Component Value Date/Time   HGBA1C 6.6 (H) 11/03/2019 09:33 AM   HGBA1C 6.2 11/01/2018  08:51 AM   MICROALBUR 1.0 12/18/2015 03:23 PM   MICROALBUR 0.8 09/13/2014 09:01 AM   Checking BG: 2x per Day  Reports BG before breakfast: 160s (not checking daily) Reports BG before lunch and before supper: 118-135 (checks daily) Hypoglycemia: reports feeling like he is going to pass out when his BG is < 90, yesterday BG was 85  at 5 PM and felt bad, ate a chocolate candy bar; denies doing any activity yesterday, slept a lot, didn't eat as much as usual; prior to yesterday it had a been a year since BG < 90  A1c goal < 7% Patient has failed these meds in past: metformin (diarrhea) Patient is currently uncontrolled on the following medications:   Glipizide 10 mg ER - 1 tablet daily with food  We discussed:  Update 01/13/2020 - Blood sugar is still elevated at times at home. Blood sugar was 252 mg/dL. Patient does not feel that Glipizide ER 10 mg daily is enough for his blood sugar. He has been self-adjusting to take a second 1/2 tablet on days his blood sugar is high. His blood sugar is not running low lately.  Morning fasting blood sugar in the last  few weeks has been ~156, 164, 209, 160, 134, 166, 132, 134, 148, 226, 238, 246, 108, 143, 256 mg/dL. Before supper: 139 mg/dL. If healthier eating and exercise does not make a change in his blood sugars, he would like to consider increasing glipizide dose.   Diet - Patient indicates he is not watching carbohydrate his intake.   Exercise - Patient plans to start riding his bicycle 3 times each week.   Plan: Continue current medications.    Medication Management   OTCs: denies vitamins or OTC medications except Metamucil, not taking aspirin; reports taking Metamucil most days to prevent diarrhea, drinks plenty of water [facility] Adherence: Patient reports frustration with running out of medications, hassle of getting medications filled; he is out of multiple medications because insurance too soon to refill Cost: denies  concerns  We discussed: Verbal consent obtained for [facility] enhanced pharmacy services (medication synchronization, adherence packaging, delivery coordination). A medication sync plan was created to allow patient to get all medications delivered once every 30 to 90 days per patient preference. Patient understands they have freedom to choose pharmacy and clinical pharmacist will coordinate care between all prescribers and [facility].  Plan: Utilize [facility] for medication coordination, sync and delivery services.   CCM Follow Up: 2 weeks on blood sugar log to determine if medication needs to increased  Sherre Poot, PharmD, [facility] Clinical Pharmacist [facility] [phone] (office) [phone] (mobile)

## 2020-01-27 NOTE — Chronic Care Management (AMB) (Deleted)
Chronic Care Management Pharmacy  Name: Eric Hill  MRN: [id number] DOB: 1950-09-27  Chief Complaint/ HPI  Eric Hill,  69 y.o., male presents for their Follow-Up CCM visit with the clinical pharmacist via telephone.  PCP : Pleas [name], NP  Their chronic conditions include: HTN, type 2 DM, ED, vitamin D deficiency, MDD, obesity, urinary urgency   Patient concerns: reports sertraline dose is incorrect and he recently restarted cholesterol medication and vitamin D; feels awful, nausea, fatigue, not sleeping well  Update 01/13/2020 since last CCM visit:  O2 level fluctuating form 94-97% throughout the day. Still very low energy. Having stomach pain and has a history of stomach cancer in his family. He wants to see about this once he gets over his illness. Fever has resolved and feeling some better.   Office Visits: 11/08/19: PCP visit - start vitamin D 50,000 IU weekly; DM A1c 6.6 on glipizide, foot exam completed today, eye exam scheduled; cholesterol, ran out of statin, refill today; doing well on sertraline 75 mg daily, BP stable, continue lisinopril and metoprolol   Consult Visit: none in past 6 months  No Known Allergies   Medications: Outpatient Encounter Medications as of 01/30/2020  Medication Sig  . atorvastatin (LIPITOR) 20 MG tablet Take 1 tablet (20 mg total) by mouth daily. For cholesterol.  . blood glucose meter kit and supplies KIT Dispense based on patient and insurance preference. Use up to 2-3 times daily as directed. Dx is E11.9  . glipiZIDE (GLUCOTROL XL) 10 MG 24 hr tablet TAKE 1 TABLET BY MOUTH IN THE MORNING WITH FOOD FOR DIABETES  . lisinopril (ZESTRIL) 20 MG tablet Take 1 tablet (20 mg total) by mouth daily. for blood pressure  . metoprolol tartrate (LOPRESSOR) 25 MG tablet Take 1 tablet (25 mg total) by mouth 2 (two) times daily.  . psyllium (METAMUCIL) 58.6 % powder Take 1 packet by mouth as needed.  . sertraline (ZOLOFT) 50 MG tablet  Take 1.5 tablets (75 mg total) by mouth daily. For depression.  . tamsulosin (FLOMAX) 0.4 MG CAPS capsule Take 2 capsules (0.8 mg total) by mouth daily. FOR URINE FLOW (Patient taking differently: Take 0.4 mg by mouth daily. FOR URINE FLOW - patient has resumed once daily dosing.)  . Vitamin D, Ergocalciferol, (DRISDOL) 1.25 MG (50000 UNIT) CAPS capsule Take 1 capsule by mouth once weekly for vitamin D.   No facility-administered encounter medications on file as of 01/30/2020.   No Known Allergies  SDOH Screenings   Alcohol Screen:   . Last Alcohol Screening Score (AUDIT): Not on file  Depression (PHQ2-9):   . PHQ-2 Score: Not on file  Financial Resource Strain: Low Risk   . Difficulty of Paying Living Expenses: Not very hard  Food Insecurity:   . Worried About Charity fundraiser in the Last Year: Not on file  . Ran Out of Food in the Last Year: Not on file  Housing:   . Last Housing Risk Score: Not on file  Physical Activity:   . Days of Exercise per Week: Not on file  . Minutes of Exercise per Session: Not on file  Social Connections:   . Frequency of Communication with Friends and Family: Not on file  . Frequency of Social Gatherings with Friends and Family: Not on file  . Attends Religious Services: Not on file  . Active Member of Clubs or Organizations: Not on file  . Attends Archivist Meetings: Not on file  .  Marital Status: Not on file  Stress:   . Feeling of Stress : Not on file  Tobacco Use: Medium Risk  . Smoking Tobacco Use: Former Smoker  . Smokeless Tobacco Use: Never Used  Transportation Needs:   . Film/video editor (Medical): Not on file  . Lack of Transportation (Non-Medical): Not on file     Current Diagnosis/Assessment:   Goals    . Pharmacy Care Plan     CARE PLAN ENTRY (see longitudinal plan of care for additional care plan information)  Current Barriers:  . Chronic Disease Management support, education, and care coordination  needs related to Hypertension, Diabetes, and Urinary Urgency   Hypertension BP Readings from Last 3 Encounters:  11/08/19 132/82  10/25/19 (!) 144/77  05/05/19 131/68 .  Pharmacist Clinical Goal(s): Over the next 30 days, patient will work with PharmD and providers to achieve BP goal <140/90 mmHg . Current regimen:   Lisinopril 20 mg - 1 tablet daily  Metoprolol tartrate 25 mg - 1 tablet twice daily . Interventions: o Reviewed blood pressure log and discussed proper technique for checking blood pressure.  o Reviewed patient's medication list. . Patient self care activities - Over the next 30 days, patient will: o Recommend patient resume biking 3 times weekly now that temperatures have improved.  o Continue to check blood pressure twice daily, document, and provide at future appointment for review o Ensure daily salt intake < 2300 mg/day  Cardiovascular Risk Reduction Lab Results  Component Value Date/Time   LDLCALC 83 11/01/2018 08:51 AM   LDLDIRECT 100.0 11/03/2019 09:33 AM .  Pharmacist Clinical Goal(s): o Over the next 30 days, patient will work with PharmD and providers to improve medication side effects and reduce cardiovascular risk  . Current regimen:  o Atorvastatin 20 mg - 1 tablet daily . Interventions: o Recommend discontinuing atorvastatin due to intolerance (fatigue, nausea). Restore vitamin D levels and then consider starting pravastatin to reduce cardiovascular risk. . Patient self care activities - Over the next 30 days, patient will: o Discontinue atorvastatin  o Continue vitamin D once weekly   Diabetes Lab Results  Component Value Date/Time   HGBA1C 6.6 (H) 11/03/2019 09:33 AM   HGBA1C 6.2 11/01/2018 08:51 AM  Fasting home blood glucose readings: 160s before breakfast, 118-135 before lunch and supper . Pharmacist Clinical Goal(s): o Over the next 30 days, patient will work with PharmD and providers to maintain A1c goal <7% and ensure no hypoglycemia (BG  < 70 mg/dL) . Current regimen:   Glipizide 10 mg ER - 1 tablet daily before first meal of the day Interventions: o Recommend eating a high protein and fiber snack before bedtime to prevent overnight hypoglycemia, such as yogurt with no sugar added or cheese and nuts o Reviewed blood sugar readings. Discussed elevated blood sugars recently. Will follow-up in 2 weeks to determine if improving with diet/exercise.  . Patient self care activities - Over the next 30 days, patient will: o Check blood sugar daily first thing in the morning before eating or drinking, document, and provide at future appointment o Contact provider with any episodes of hypoglycemia o Review labels on snacks and choose options with 15-20 grams of carbs with high protein and fiber  Urinary Urgency . Pharmacist Clinical Goal(s) o Over the next 30 days, patient will work with PharmD and providers to improve urinary symptoms including weak stream . Current regimen:  o Tamsulosin 0.4 mg - 1 capsule daily with breakfast . Interventions: o  Discussed patient reports 2 capsule daily dose did not improve symptoms and he didn't feel well so he resumed daily.  . Patient self care activities - Over the next 30 days, patient will: o Continue tamsulosin 0.4 mg daily o Monitor for symptoms improvement over the next 2-3 weeks  Medication management . Pharmacist Clinical Goal(s): o Over the next 30 days, patient will work with PharmD and providers to achieve optimal medication adherence . Current pharmacy: [facility] . Interventions o Comprehensive medication review performed. o Utilize [facility] for medication synchronization, packaging and delivery . Patient self care activities - Over the next 30 days, patient will: o Coordinate with pharmacist for to have medications synchronized o Call [facility] with any questions or concerns o Continue to take medications as prescribed  Please see past updates related to this goal  by clicking on the "Past Updates" button in the selected goal         Hypertension   CMP Latest Ref Rng & Units 11/03/2019 11/01/2018 05/03/2018  Glucose 70 - 99 mg/dL 217(H) 150(H) 146(H)  BUN 6 - 23 mg/dL '20 17 22  ' Creatinine 0.40 - 1.50 mg/dL 0.88 0.81 0.76  Sodium 135 - 145 mEq/L 136 138 136  Potassium 3.5 - 5.1 mEq/L 4.4 4.5 4.3  Chloride 96 - 112 mEq/L 100 103 102  CO2 19 - 32 mEq/L '28 28 27  ' Calcium 8.4 - 10.5 mg/dL 9.1 8.7 9.3  Total Protein 6.0 - 8.3 g/dL 6.9 6.8 -  Total Bilirubin 0.2 - 1.2 mg/dL 0.6 0.5 -  Alkaline Phos 39 - 117 U/L 77 77 -  AST 0 - 37 U/L 19 14 -  ALT 0 - 53 U/L 19 14 -   Office blood pressures are: BP Readings from Last 3 Encounters:  11/08/19 132/82  10/25/19 (!) 144/77  05/05/19 131/68   Patient has failed these meds in the past: none reported Patient checks BP at home: 2x per day, [employer]uff Patient home BP readings are ranging: 160/73, 68 (yesterday - he is currently out of lisinopril and metoprolol for the past 2 days); reports usually 145-160/70-80s in the morning before medications, 125/70-80 in the evenings  BP goal < 140/90 mmHg Patient is currently controlled on the following medications:   Lisinopril 20 mg - 1 tablet daily  Metoprolol tartrate 25 mg - 1 tablet BID (pt taking differently: 1 tablet in AM, 1/2 at night)   Update 01/13/2020 - Patient purchased a bike trainer this week to be able to exercise  and has been getting out of the house working in his shop. Blood pressure this week has been: ~127-135/70-80 pulse ~70.  .   Plan: Continue current medications;    Diabetes   Kidney Function Lab Results  Component Value Date/Time   CREATININE 0.88 11/03/2019 09:33 AM   CREATININE 0.81 11/01/2018 08:51 AM   GFR 86.48 11/03/2019 09:33 AM   K 4.4 11/03/2019 09:33 AM   K 4.5 11/01/2018 08:51 AM   Recent Relevant Labs: Lab Results  Component Value Date/Time   HGBA1C 6.6 (H) 11/03/2019 09:33 AM   HGBA1C 6.2 11/01/2018  08:51 AM   MICROALBUR 1.0 12/18/2015 03:23 PM   MICROALBUR 0.8 09/13/2014 09:01 AM   Checking BG: 2x per Day  Reports BG before breakfast: 160s (not checking daily) Reports BG before lunch and before supper: 118-135 (checks daily) Hypoglycemia: reports feeling like he is going to pass out when his BG is < 90, yesterday BG was 85  at 5 PM and felt bad, ate a chocolate candy bar; denies doing any activity yesterday, slept a lot, didn't eat as much as usual; prior to yesterday it had a been a year since BG < 90  A1c goal < 7% Patient has failed these meds in past: metformin (diarrhea) Patient is currently uncontrolled on the following medications:   Glipizide 10 mg ER - 1 tablet daily with food  We discussed:  Update 01/13/2020 - Blood sugar is still elevated at times at home. Blood sugar was 252 mg/dL. Patient does not feel that Glipizide ER 10 mg daily is enough for his blood sugar. He has been self-adjusting to take a second 1/2 tablet on days his blood sugar is high. His blood sugar is not running low lately.  Morning fasting blood sugar in the last  few weeks has been ~156, 164, 209, 160, 134, 166, 132, 134, 148, 226, 238, 246, 108, 143, 256 mg/dL. Before supper: 139 mg/dL. If healthier eating and exercise does not make a change in his blood sugars, he would like to consider increasing glipizide dose.   Diet - Patient indicates he is not watching carbohydrate his intake.   Exercise - Patient plans to start riding his bicycle 3 times each week.   Plan: Continue current medications.    Medication Management   OTCs: denies vitamins or OTC medications except Metamucil, not taking aspirin; reports taking Metamucil most days to prevent diarrhea, drinks plenty of water [facility] Adherence: Patient reports frustration with running out of medications, hassle of getting medications filled; he is out of multiple medications because insurance too soon to refill Cost: denies  concerns  We discussed: Verbal consent obtained for [facility] enhanced pharmacy services (medication synchronization, adherence packaging, delivery coordination). A medication sync plan was created to allow patient to get all medications delivered once every 30 to 90 days per patient preference. Patient understands they have freedom to choose pharmacy and clinical pharmacist will coordinate care between all prescribers and [facility].  Plan: Utilize [facility] for medication coordination, sync and delivery services.   CCM Follow Up: 2 weeks on blood sugar log to determine if medication needs to increased  Sherre Poot, PharmD, [facility] Clinical Pharmacist [facility] [phone] (office) [phone] (mobile)

## 2020-01-30 ENCOUNTER — Telehealth: Payer: Medicare Other

## 2020-01-31 ENCOUNTER — Other Ambulatory Visit: Payer: Self-pay | Admitting: Primary Care

## 2020-01-31 DIAGNOSIS — R3915 Urgency of urination: Secondary | ICD-10-CM

## 2020-02-02 ENCOUNTER — Telehealth: Payer: Self-pay

## 2020-02-02 NOTE — Chronic Care Management (AMB) (Signed)
Chronic Care Management Pharmacy Assistant   Name: MALIKAH LAKEY  MRN: [id number] DOB: Mar 23, 1951  Reason for Encounter: Medication Review/ Monthly Dispensing Call  Patient Questions:  1.  Have you seen any other providers since your last visit? No   2.  Any changes in your medicines or health? Yes, Tamsulosin was changed from 0.8 mg to 0.4 mg 1 capsule by mouth one daily with breakfast for urine on 01/31/2020.   PCP : Pleas [name], NP  Allergies:  No Known Allergies  Medications: Outpatient Encounter Medications as of 02/02/2020  Medication Sig  . atorvastatin (LIPITOR) 20 MG tablet Take 1 tablet (20 mg total) by mouth daily. For cholesterol.  . blood glucose meter kit and supplies KIT Dispense based on patient and insurance preference. Use up to 2-3 times daily as directed. Dx is E11.9  . glipiZIDE (GLUCOTROL XL) 10 MG 24 hr tablet TAKE 1 TABLET BY MOUTH IN THE MORNING WITH FOOD FOR DIABETES  . lisinopril (ZESTRIL) 20 MG tablet Take 1 tablet (20 mg total) by mouth daily. for blood pressure  . metoprolol tartrate (LOPRESSOR) 25 MG tablet Take 1 tablet (25 mg total) by mouth 2 (two) times daily.  . psyllium (METAMUCIL) 58.6 % powder Take 1 packet by mouth as needed.  . sertraline (ZOLOFT) 50 MG tablet Take 1.5 tablets (75 mg total) by mouth daily. For depression.  . tamsulosin (FLOMAX) 0.4 MG CAPS capsule TAKE 1 CAPSULE BY MOUTH ONCE DAILY WITH BREAKFAST FOR URINE FLOW  . Vitamin D, Ergocalciferol, (DRISDOL) 1.25 MG (50000 UNIT) CAPS capsule Take 1 capsule by mouth once weekly for vitamin D.   No facility-administered encounter medications on file as of 02/02/2020.    Current Diagnosis: Patient Active Problem List   Diagnosis Date Noted  . Welcome to Medicare preventive visit 11/02/2018  . Palpitations 01/22/2018  . Major depressive disorder 09/17/2016  . Urinary urgency 09/17/2016  . Type 2 diabetes mellitus without complication ([place]) 11/65/7903  . Vitamin D  deficiency 09/12/2014  . Preventative health care 09/12/2014  . Sleep pattern disturbance 08/10/2014  . Erectile dysfunction 08/10/2014  . Accessory skin tags 08/10/2014  . Essential hypertension 08/10/2014  . Knee pain, chronic 08/10/2014  . Obesity, morbid, BMI 40.0-49.9 ([place]) 08/10/2014    Reviewed chart for medication changes ahead of medication coordination call.  No OVs, Consults, or hospital visits since last care coordination call/Pharmacist visit. Medication changes indicated in chart, telephone call 01/31/2020- Tamsulosin was changed from 0.8 mg to 0.4 mg 1 capsule by mouth one daily with breakfast for urine.   BP Readings from Last 3 Encounters:  11/08/19 132/82  10/25/19 (!) 144/77  05/05/19 131/68    Lab Results  Component Value Date   HGBA1C 6.6 (H) 11/03/2019     Patient obtains medications through [facility]  90 Days   Last adherence delivery include: Glipizide ER 10 mg- 1 tablet daily at breakfast, Metoprolol tartrate 25 mg- 1 tablet twice daily.  Patient declined the following medications last month: Tamsulosin 0.4 mg due to receiving a [facility] day [facility], Lisinopril 20 mg due to receiving a 90 day supply [facility], Atorvastatin 20 mg and Vitamin D2 due to receiving a 90 day supply [facility]. Sertraline 50 mg due to receiving a 90 day supply on 11/25/2019 from [facility].  Patient is due for next adherence delivery on: 02/13/2020.  02/02/2020- Called patient, left message to return call to review medications  for delivery.  02/10/2020- Called patient, left message to return call to review medication for delivery.  02/16/2020- Patient returned call. Patient did not want to continue with [facility], he would [facility]. Explained the benefits of the adherence program but patient stated he does not want to wait around for medications, he is always busy.     Follow-Up:  Comptroller and Pharmacist Review  Donette Larry, CPP notified, [facility] notified.  Medication Adherence Review: Per insurance data patient is 100% Adherent to Lisinopril 20 mg for Hypertension and Glipizide 10 mg for Diabetes but not adherent to Atorvastatin 20 mg for Hyperlipidemia.   Pattricia Boss, [place] Pharmacist Assistant [phone]

## 2020-02-13 ENCOUNTER — Telehealth: Payer: Medicare Other

## 2020-03-30 ENCOUNTER — Ambulatory Visit: Payer: Self-pay

## 2020-03-30 ENCOUNTER — Telehealth: Payer: Medicare Other

## 2020-03-30 NOTE — Chronic Care Management (AMB) (Signed)
   Chronic Care Management Pharmacy  Name: Eric Hill  MRN: [id number] DOB: October 31, 1950  Attempted to reach patient for follow up CCM appointment on 12/3 at 10:15 AM. Left voicemail with contact information.  Debbora Dus, PharmD Clinical Pharmacist [facility] [phone]

## 2020-03-30 NOTE — Chronic Care Management (AMB) (Deleted)
Chronic Care Management Pharmacy  Name: Eric Hill  MRN: [id number] DOB: 10-Oct-1950  Chief Complaint/ HPI  Eric Hill,  69 y.o., male presents for their Initial CCM visit with the clinical pharmacist via telephone.  PCP : Pleas [name], NP  Their chronic conditions include: HTN, type 2 DM, ED, vitamin D deficiency, MDD, obesity, urinary urgency   Patient concerns: reports sertraline dose is incorrect and he recently restarted cholesterol medication and vitamin D; feels awful, nausea, fatigue, not sleeping well  Office Visits: 01/13/20: CCM visit - monitor BG for 2 weeks  01/06/20: CCM visit - monitor BP for 2 weeks  11/08/19: PCP visit - start vitamin D 50,000 IU weekly; DM A1c 6.6 on glipizide, foot exam completed today, eye exam scheduled; cholesterol, ran out of statin, refill today; doing well on sertraline 75 mg daily, BP stable, continue lisinopril and metoprolol   Consult Visit: none in past 6 months  No Known Allergies   Medications: Outpatient Encounter Medications as of 03/30/2020  Medication Sig  . atorvastatin (LIPITOR) 20 MG tablet Take 1 tablet (20 mg total) by mouth daily. For cholesterol.  . blood glucose meter kit and supplies KIT Dispense based on patient and insurance preference. Use up to 2-3 times daily as directed. Dx is E11.9  . glipiZIDE (GLUCOTROL XL) 10 MG 24 hr tablet TAKE 1 TABLET BY MOUTH IN THE MORNING WITH FOOD FOR DIABETES  . lisinopril (ZESTRIL) 20 MG tablet Take 1 tablet (20 mg total) by mouth daily. for blood pressure  . metoprolol tartrate (LOPRESSOR) 25 MG tablet Take 1 tablet (25 mg total) by mouth 2 (two) times daily.  . psyllium (METAMUCIL) 58.6 % powder Take 1 packet by mouth as needed.  . sertraline (ZOLOFT) 50 MG tablet Take 1.5 tablets (75 mg total) by mouth daily. For depression.  . tamsulosin (FLOMAX) 0.4 MG CAPS capsule TAKE 1 CAPSULE BY MOUTH ONCE DAILY WITH BREAKFAST FOR URINE FLOW  . Vitamin D, Ergocalciferol,  (DRISDOL) 1.25 MG (50000 UNIT) CAPS capsule Take 1 capsule by mouth once weekly for vitamin D.   No facility-administered encounter medications on file as of 03/30/2020.   Current Diagnosis/Assessment:   Goals    . Pharmacy Care Plan     CARE PLAN ENTRY (see longitudinal plan of care for additional care plan information)  Current Barriers:  . Chronic Disease Management support, education, and care coordination needs related to Hypertension, Diabetes, and Urinary Urgency   Hypertension BP Readings from Last 3 Encounters:  11/08/19 132/82  10/25/19 (!) 144/77  05/05/19 131/68 .  Pharmacist Clinical Goal(s): Over the next 30 days, patient will work with PharmD and providers to achieve BP goal <140/90 mmHg . Current regimen:   Lisinopril 20 mg - 1 tablet daily  Metoprolol tartrate 25 mg - 1 tablet twice daily . Interventions: o Reviewed blood pressure log and discussed proper technique for checking blood pressure.  o Reviewed patient's medication list. . Patient self care activities - Over the next 30 days, patient will: o Recommend patient resume biking 3 times weekly now that temperatures have improved.  o Continue to check blood pressure twice daily, document, and provide at future appointment for review o Ensure daily salt intake < 2300 mg/day  Cardiovascular Risk Reduction Lab Results  Component Value Date/Time   LDLCALC 83 11/01/2018 08:51 AM   LDLDIRECT 100.0 11/03/2019 09:33 AM .  Pharmacist Clinical Goal(s): o Over the next 30 days, patient will work with PharmD and providers  to improve medication side effects and reduce cardiovascular risk  . Current regimen:  o Atorvastatin 20 mg - 1 tablet daily . Interventions: o Recommend discontinuing atorvastatin due to intolerance (fatigue, nausea). Restore vitamin D levels and then consider starting pravastatin to reduce cardiovascular risk. . Patient self care activities - Over the next 30 days, patient will: o Discontinue  atorvastatin  o Continue vitamin D once weekly   Diabetes Lab Results  Component Value Date/Time   HGBA1C 6.6 (H) 11/03/2019 09:33 AM   HGBA1C 6.2 11/01/2018 08:51 AM  Fasting home blood glucose readings: 160s before breakfast, 118-135 before lunch and supper . Pharmacist Clinical Goal(s): o Over the next 30 days, patient will work with PharmD and providers to maintain A1c goal <7% and ensure no hypoglycemia (BG < 70 mg/dL) . Current regimen:   Glipizide 10 mg ER - 1 tablet daily before first meal of the day Interventions: o Recommend eating a high protein and fiber snack before bedtime to prevent overnight hypoglycemia, such as yogurt with no sugar added or cheese and nuts o Reviewed blood sugar readings. Discussed elevated blood sugars recently. Will follow-up in 2 weeks to determine if improving with diet/exercise.  . Patient self care activities - Over the next 30 days, patient will: o Check blood sugar daily first thing in the morning before eating or drinking, document, and provide at future appointment o Contact provider with any episodes of hypoglycemia o Review labels on snacks and choose options with 15-20 grams of carbs with high protein and fiber  Urinary Urgency . Pharmacist Clinical Goal(s) o Over the next 30 days, patient will work with PharmD and providers to improve urinary symptoms including weak stream . Current regimen:  o Tamsulosin 0.4 mg - 1 capsule daily with breakfast . Interventions: o Discussed patient reports 2 capsule daily dose did not improve symptoms and he didn't feel well so he resumed daily.  . Patient self care activities - Over the next 30 days, patient will: o Continue tamsulosin 0.4 mg daily o Monitor for symptoms improvement over the next 2-3 weeks  Medication management . Pharmacist Clinical Goal(s): o Over the next 30 days, patient will work with PharmD and providers to achieve optimal medication adherence . [facility] . Interventions o Comprehensive medication review performed. o Utilize [facility] pharmacy for medication synchronization, packaging and delivery . Patient self care activities - Over the next 30 days, patient will: o Coordinate with pharmacist for to have medications synchronized o Call [facility] with any questions or concerns o Continue to take medications as prescribed  Please see past updates related to this goal by clicking on the "Past Updates" button in the selected goal         Hypertension   CMP Latest Ref Rng & Units 11/03/2019 11/01/2018 05/03/2018  Glucose 70 - 99 mg/dL 217(H) 150(H) 146(H)  BUN 6 - 23 mg/dL '20 17 22  ' Creatinine 0.40 - 1.50 mg/dL 0.88 0.81 0.76  Sodium 135 - 145 mEq/L 136 138 136  Potassium 3.5 - 5.1 mEq/L 4.4 4.5 4.3  Chloride 96 - 112 mEq/L 100 103 102  CO2 19 - 32 mEq/L '28 28 27  ' Calcium 8.4 - 10.5 mg/dL 9.1 8.7 9.3  Total Protein 6.0 - 8.3 g/dL 6.9 6.8 -  Total Bilirubin 0.2 - 1.2 mg/dL 0.6 0.5 -  Alkaline Phos 39 - 117 U/L 77 77 -  AST 0 - 37 U/L 19 14 -  ALT 0 - 53  U/L 19 14 -   Office blood pressures are: BP Readings from Last 3 Encounters:  11/08/19 132/82  10/25/19 (!) 144/77  05/05/19 131/68   Patient has failed these meds in the past: none reported Patient checks BP at home: 2x per day, [employer] Cuff Patient home BP readings are ranging: 160/73, 68 (yesterday - he is currently out of lisinopril and metoprolol for the past 2 days); reports usually 145-160/70-80s in the morning before medications, 125/70-80 in the evenings  BP goal < 140/90 mmHg Patient is currently uncontrolled on the following medications:   Lisinopril 20 mg - 1 tablet daily  Metoprolol tartrate 25 mg - 1 tablet BID (pt taking differently: 1 tablet in AM, 1/2 at night)  We discussed: Reports he has been out of both medications for 2 days, but pharmacy says its too soon to fill per insurance. Pt is unable to verify last refill date because he is using an old  bottle. Per dispensing records, he should not be out of medication. Last filled lisinopril 5/19 for 90 DS, metoprolol 6/29 for 90 DS. Reports only taking 1/2 metoprolol at night because higher dose made him oversleep, reports dose was recently increased to 1 tab BID by cardio to reduce morning BP readings.  Exercise: rides bike when weather is nice, its been about two weeks since last ride; works outside around the house to get some exercise   Reports history of palpitations  Update 01/13/2020 - Patient purchased a bike trainer this week to be able to exercise  and has been getting out of the house working in his shop. Blood pressure this week has been: ~127-135/70-80 pulse ~70.   Plan: Continue current medications  CV Risk Reduction   LDL goal < 100  Lipid Panel     Component Value Date/Time   CHOL 167 11/03/2019 0933   TRIG 246.0 (H) 11/03/2019 0933   HDL 40.40 11/03/2019 0933   LDLCALC 83 11/01/2018 0851   LDLDIRECT 100.0 11/03/2019 0933    Hepatic Function Latest Ref Rng & Units 11/03/2019 11/01/2018 09/22/2017  Total Protein 6.0 - 8.3 g/dL 6.9 6.8 7.6  Albumin 3.5 - 5.2 g/dL 4.1 4.0 4.0  AST 0 - 37 U/L '19 14 18  ' ALT 0 - 53 U/L '19 14 16  ' Alk Phosphatase 39 - 117 U/L 77 77 58  Total Bilirubin 0.2 - 1.2 mg/dL 0.6 0.5 0.6    The 10-year ASCVD risk score Mikey Bussing DC Jr., et al., 2013) is: 31.7%   Values used to calculate the score:     Age: 64 years     Sex: Male     Is Non-Hispanic African American: No     Diabetic: Yes     Tobacco smoker: No     Systolic Blood Pressure: 798 mmHg     Is BP treated: Yes     HDL Cholesterol: 40.4 mg/dL     Total Cholesterol: 167 mg/dL   Patient has failed these meds in past: none  Patient is currently on the following medications:  . Atorvastatin 20 mg - 1 tablet daily  Adherence: Pt just restarted atorvastatin 3-4 days ago and reports feeling awful (nausea, fatigue). Reports he took it for 2-3 weeks at the end of last year and also felt bad.  Denies any muscle pain. Reports this his third time retrying this dose and he would like to discontinue.   Assessment: Vitamin D level very low, just started therapy this week which could be contributing  to overall fatigue as well as increased side effects with statin. Discussed this with patient and agreed okay to discontinue. Consider trying a different statin at follow up.   Update 01/06/2020 - Patient reports the vitamin D supplementation hurts his stomach but he is taking. Hoping to resume bike riding soon to benefit blood sugar and heart health. Has not resumed atorvastatin.    Plan: Stop atorvastatin due to fatigue and nausea. Consider trying a different statin after vitamin D level restored.   Diabetes   Kidney Function Lab Results  Component Value Date/Time   CREATININE 0.88 11/03/2019 09:33 AM   CREATININE 0.81 11/01/2018 08:51 AM   GFR 86.48 11/03/2019 09:33 AM   K 4.4 11/03/2019 09:33 AM   K 4.5 11/01/2018 08:51 AM   Recent Relevant Labs: Lab Results  Component Value Date/Time   HGBA1C 6.6 (H) 11/03/2019 09:33 AM   HGBA1C 6.2 11/01/2018 08:51 AM   MICROALBUR 1.0 12/18/2015 03:23 PM   MICROALBUR 0.8 09/13/2014 09:01 AM   Checking BG: 2x per Day  Reports BG before breakfast: 160s (not checking daily) Reports BG before lunch and before supper: 118-135 (checks daily)  A1c goal < 7% Patient has failed these meds in past: metformin (diarrhea) Patient is currently uncontrolled on the following medications:   Glipizide 10 mg ER - 1 tablet daily with food  We discussed: Eats a snack before bed to prevent lows in the morning - usually potato chips, "junk", little debbie raisin cakes. Reports overall appetite has decreased but has not lost any weight. Usually doesn't eat anything until 10-11 AM, soup or can of tuna, eats again 5:30 PM, usually goes to restaurant, gets chicken salad over lettuce with side of peaches  Drinks water only   Assessment: A1c appears accurate (no  CKD, anemia), however, fasting BG higher than expected based on A1c. Possible patient is having some hypoglycemia that may be bringing A1c down. He denies any readings < 70. Recommended healthier bedtime snack and checking fasting BG daily to ensure BG is not dropping low overnight.   Update 01/13/2020 - Blood sugar is still elevated at times at home. Blood sugar was 252 mg/dL. Patient does not feel that Glipizide ER 10 mg daily is enough for his blood sugar. He has been self-adjusting to take a second 1/2 tablet on days his blood sugar is high. His blood sugar is not running low lately.  Morning fasting blood sugar in the last  few weeks has been ~156, 164, 209, 160, 134, 166, 132, 134, 148, 226, 238, 246, 108, 143, 256 mg/dL. Before supper: 139 mg/dL. If healthier eating and exercise does not make a change in his blood sugars, he would like to consider increasing glipizide dose.  Diet - Patient indicates he is not watching carbohydrate his intake.   Exercise - Patient plans to start riding his bicycle 3 times each week.   Plan: Continue current medications; Recommend dietary changes to reduce carbohydrates and increase protein and fiber. Check blood glucose every morning before breakfast and with any symptoms of low blood sugar.   Depression   Patient has failed these meds in past: none reported Patient is currently controlled on the following medications:   Sertraline 50 mg - 1 and 1/2 tablet (75 mg) daily  We discussed: Pt reports dose was increased to 100 mg incorrectly at most recent refill. Contacted pharmacy and dose was accidentally increased to 100 mg despite prescription stating 75 mg. Patient has been cutting the 100 mg  tablet into 3/4 and reports not feeling well. Coordinated with [facility] to have patient receive new prescription for original dose of 75 mg delivered this week.  Plan: Continue current medications   Urinary Urgency    PSA  Date Value Ref Range Status  11/01/2018  0.67 0.10 - 4.00 ng/ml Final    Comment:    Test performed using Access Hybritech PSA Assay, a parmagnetic partical, chemiluminecent immunoassay.    Patient has failed these meds in past: none reported Patient is currently uncontrolled on the following medications:  . Tamsulosin 0.4 mg - 1 capsule daily with breakfast  We discussed: Pt has noticed slight improvement in weak stream with tamsulosin, reports nighttime frequency has improved a lot, only wakes up once/night to urinate (previously 8-9x per night). He reports interest in trying higher dose to see if improvement in weak stream.   Update 01/06/2020 - He took Tamsulosin twice daily for a few days but didn't help and he returned to once daily dosing. He prefers to resume once daily because lack of benefit and didn't feel his best while on increased dose.   Plan: Continue current medications; Consult PCP to increase tamsulosin to 2 capsules daily. CMA to contact patient for update on symptoms in 3 weeks.  Vitamin D Deficiency   Vitamin D (11/03/19): 11 (low) Patient has failed these meds in past: none reported Patient is currently uncontrolled on the following medications:   Vitamin D 50,000 units - 1 capsule weekly  We discussed: started first weekly dose on 11/18/19 (Friday)  Update 01/06/2020 - Patient reports medication hurts his stomach. He plans to schedule GI visit once COVID symptoms subside.   Plan: Continue current medications   Medication Management   OTCs: denies vitamins or OTC medications except Metamucil, not taking aspirin; reports taking Metamucil most days to prevent diarrhea, drinks plenty of water [facility] Adherence: Patient reports frustration with running out of medications, hassle of getting medications filled; he is out of multiple medications because insurance too soon to refill Cost: denies concerns 02/02/20: [facility]  Plan: Utilize [facility] for  medication coordination, sync and delivery services.   CCM Follow Up:    Debbora Dus, PharmD Clinical Pharmacist [facility] at [facility] [phone]

## 2020-04-25 ENCOUNTER — Other Ambulatory Visit: Payer: Self-pay | Admitting: Primary Care

## 2020-04-25 DIAGNOSIS — I10 Essential (primary) hypertension: Secondary | ICD-10-CM

## 2020-04-25 NOTE — Telephone Encounter (Signed)
Pharmacy requests refill on: Lisinopril 20 mg   LAST REFILL: 11/04/2019 (Q-90, R-1) LAST OV: 11/08/2019 NEXT OV: 05/16/2020 PHARMACY: [id number] [place], [place]

## 2020-04-26 ENCOUNTER — Other Ambulatory Visit: Payer: Self-pay | Admitting: Cardiovascular Disease

## 2020-04-26 DIAGNOSIS — R002 Palpitations: Secondary | ICD-10-CM

## 2020-05-06 DIAGNOSIS — J209 Acute bronchitis, unspecified: Secondary | ICD-10-CM | POA: Diagnosis not present

## 2020-05-06 DIAGNOSIS — J069 Acute upper respiratory infection, unspecified: Secondary | ICD-10-CM | POA: Diagnosis not present

## 2020-05-06 DIAGNOSIS — R509 Fever, unspecified: Secondary | ICD-10-CM | POA: Diagnosis not present

## 2020-05-16 ENCOUNTER — Ambulatory Visit: Payer: Medicare Other | Admitting: Primary Care

## 2020-05-23 ENCOUNTER — Telehealth: Payer: Self-pay

## 2020-05-23 NOTE — Chronic Care Management (AMB) (Addendum)
Chronic Care Management Pharmacy Assistant   Name: Eric Hill  MRN: [id number] DOB: Oct 02, 1950  Reason for Encounter: Disease State- Diabetes  Patient Questions:  1.  Have you seen any other providers since your last visit? No  2.  Any changes in your medicines or health? No   PCP : Eric [name], NP  Allergies:  No Known Allergies  Medications: Outpatient Encounter Medications as of 05/23/2020  Medication Sig   atorvastatin (LIPITOR) 20 MG tablet Take 1 tablet (20 mg total) by mouth daily. For cholesterol.   blood glucose meter kit and supplies KIT Dispense based on patient and insurance preference. Use up to 2-3 times daily as directed. Dx is E11.9   glipiZIDE (GLUCOTROL XL) 10 MG 24 hr tablet TAKE 1 TABLET BY MOUTH IN THE MORNING WITH FOOD FOR DIABETES   lisinopril (ZESTRIL) 20 MG tablet TAKE 1 TABLET (20 MG TOTAL) BY MOUTH DAILY. FOR BLOOD PRESSURE   metoprolol tartrate (LOPRESSOR) 25 MG tablet TAKE 1 TABLET BY MOUTH TWICE A DAY   psyllium (METAMUCIL) 58.6 % powder Take 1 packet by mouth as needed.   sertraline (ZOLOFT) 50 MG tablet Take 1.5 tablets (75 mg total) by mouth daily. For depression.   tamsulosin (FLOMAX) 0.4 MG CAPS capsule TAKE 1 CAPSULE BY MOUTH ONCE DAILY WITH BREAKFAST FOR URINE FLOW   Vitamin D, Ergocalciferol, (DRISDOL) 1.25 MG (50000 UNIT) CAPS capsule Take 1 capsule by mouth once weekly for vitamin D.   No facility-administered encounter medications on file as of 05/23/2020.    Current Diagnosis: Patient Active Problem List   Diagnosis Date Noted   Welcome to Medicare preventive visit 11/02/2018   Palpitations 01/22/2018   Major depressive disorder 09/17/2016   Urinary urgency 09/17/2016   Type 2 diabetes mellitus without complication ([place]) 79/05/4095   Vitamin D deficiency 09/12/2014   Preventative health care 09/12/2014   Sleep pattern disturbance 08/10/2014   Erectile dysfunction 08/10/2014   Accessory skin tags 08/10/2014    Essential hypertension 08/10/2014   Knee pain, chronic 08/10/2014   Obesity, morbid, BMI 40.0-49.9 ([place]) 08/10/2014   Recent Relevant Labs: Lab Results  Component Value Date/Time   HGBA1C 6.6 (H) 11/03/2019 09:33 AM   HGBA1C 6.2 11/01/2018 08:51 AM   MICROALBUR 1.0 12/18/2015 03:23 PM   MICROALBUR 0.8 09/13/2014 09:01 AM    Kidney Function Lab Results  Component Value Date/Time   CREATININE 0.88 11/03/2019 09:33 AM   CREATININE 0.81 11/01/2018 08:51 AM   GFR 86.48 11/03/2019 09:33 AM    Current antihyperglycemic regimen:  Glipizide 10 mg ER - 1 tablet daily before first meal of the day  What recent interventions/DTPs have been made to improve glycemic control:  High protein and fiber snack prior to bedtime.   Have there been any recent hospitalizations or ED visits since last visit with CPP? No   Patient reports hypoglycemic symptoms, including Vision changes- last night 05/24/20   Patient reports hyperglycemic symptoms, including blurry vision, excessive thirst and fatigue  States he was put on dexamethasone for Covid about 3 weeks ago.   How often are you checking your blood sugar? before breakfast and at bedtime   What are your blood sugars ranging?   Fasting: 130, 140 Before meals: N/A After meals: N/A Bedtime: 450, 430, 160  Patient was on dexamethasone for about 1 week due to covid.   During the week, how often does your blood glucose drop below 70? Notes last night 05/24/20 he woke  up and felt like his sugar was low due to blurry vision and "feeling funny". States he did not have any strips to test his sugar. He has been out for a few days. He was able to eat a piece of chocolate and started feeling better.   Are you checking your feet daily/regularly? Feet look ok. Denies any wounds or sores.  Adherence Review: Is the patient currently on a STATIN medication? No Is the patient currently on ACE/ARB medication? Yes Does the patient have >5 day gap between last  estimated fill dates? CPP to review.  States he was seen at urgent care in [place] about 3 weeks ago due to Covid. He was put on short course of steroids and was notified it would raise his sugars. He also notes a blood pressure reading of 155/90 about 1 week ago. He has not been able to check it since as the cuff on the monitor broke. Patient states his O2 levels have been ranging between 95-96 this week. He also notes that Urgent Care told him to have another chest x-ray in about 2 weeks to look at his left lobe. He would like to know if Alma Friendly, NP could order this or does he need to go back to urgent care. Explained to patient that message would be sent regarding this and I would follow back up with him.   Margaretmary Dys, [facility] Pharmacy Assistant [phone]   Follow-Up:  Pharmacist Review   Debbora Dus, CPP notified  Margaretmary Dys, [place] Assistant [phone]  Patient missed last CCM visit with me December 2021. Rescheduled visit for 06/01/20 as we need to discuss restarting statin assuming he has been on vitamin D. Will review BG log again at this time.  I have reviewed the care management and care coordination activities outlined in this encounter and I am certifying that I agree with the content of this note.  Debbora Dus, PharmD Clinical Pharmacist [facility] at [facility] [phone]

## 2020-05-25 ENCOUNTER — Telehealth: Payer: Self-pay

## 2020-05-25 DIAGNOSIS — U071 COVID-19: Secondary | ICD-10-CM

## 2020-05-25 NOTE — Chronic Care Management (AMB) (Addendum)
Patient states he was seen at urgent care in [place] about 3 weeks ago due to Covid. He was put on short course of steroids. Patient states his O2 levels have been ranging between 95-96 this week. He also notes that Urgent Care told him to have another chest x-ray in about 2 weeks to look at his left lobe. He would like to know if Alma Friendly, NP could order this or does he need to go back to urgent care. Explained to patient that message would be sent regarding this and I would follow back up with him.   Pharmacist notified  Debbora Dus, CPP notified  Margaretmary Dys, [place] Pharmacy Assistant [phone]

## 2020-05-26 NOTE — Telephone Encounter (Signed)
Routing to PCP

## 2020-05-27 NOTE — Telephone Encounter (Addendum)
Yes, we can have him complete the xray here. I will order. He should come in around 2 weeks from now. Which urgent care did he go to? Can we get records?

## 2020-05-27 NOTE — Addendum Note (Signed)
Addended by: Pleas [name] on: 05/27/2020 08:23 PM   Modules accepted: Orders

## 2020-05-28 ENCOUNTER — Ambulatory Visit (INDEPENDENT_AMBULATORY_CARE_PROVIDER_SITE_OTHER)
Admission: RE | Admit: 2020-05-28 | Discharge: 2020-05-28 | Disposition: A | Discharge status: Home / Self Care | Payer: Medicare Other | Source: Ambulatory Visit | Attending: Primary Care | Admitting: Primary Care

## 2020-05-28 DIAGNOSIS — U071 COVID-19: Secondary | ICD-10-CM | POA: Diagnosis not present

## 2020-05-28 DIAGNOSIS — R918 Other nonspecific abnormal finding of lung field: Secondary | ICD-10-CM | POA: Diagnosis not present

## 2020-05-28 NOTE — Telephone Encounter (Signed)
Patient called in stating that he was planning on coming in to do a chest xray today at 4:30. Update on the patient he states that he stills has a little bit of a cough that is mainly in the mornings when he first gets up. Patient states that he has not had a fever in 4 days or so. He is taking a lot of vitamins. Just wanted to give an update on the patient.

## 2020-05-28 NOTE — Telephone Encounter (Addendum)
Noted, can we find out which urgent care he went to and get records?

## 2020-05-28 NOTE — Telephone Encounter (Signed)
Noted.  Since patient's symptoms are mild and improving, and he is 3 weeks out, I think should be fine to come in as usual at 430 today.  I did make Lab/Xray staff aware that he will be coming in the front at 430 for xray.

## 2020-05-28 NOTE — Telephone Encounter (Signed)
Left message to return call to our office.  

## 2020-05-29 NOTE — Telephone Encounter (Signed)
Patient called back. Please call him back at [phone]

## 2020-05-30 NOTE — Telephone Encounter (Signed)
Left message to return call to our office.  

## 2020-06-08 NOTE — Telephone Encounter (Signed)
Left message to return call to our office.  

## 2020-06-08 NOTE — Telephone Encounter (Signed)
Called patient went to [facility] urgent care in [place]. I have called office and they will fax notes to [place] now. Also requested xray report to be sent.

## 2020-06-08 NOTE — Telephone Encounter (Signed)
Notes received placed in your box for review.

## 2020-06-14 ENCOUNTER — Telehealth: Payer: Self-pay | Admitting: Primary Care

## 2020-06-14 NOTE — Telephone Encounter (Signed)
Returning phone call °

## 2020-06-14 NOTE — Telephone Encounter (Signed)
Called patient set up for follow up tomorrow. Will get seen before at urgent care or walk in if any red words.

## 2020-06-14 NOTE — Telephone Encounter (Signed)
Error

## 2020-06-15 ENCOUNTER — Ambulatory Visit (INDEPENDENT_AMBULATORY_CARE_PROVIDER_SITE_OTHER): Payer: Medicare Other | Admitting: Primary Care

## 2020-06-15 ENCOUNTER — Other Ambulatory Visit: Payer: Self-pay

## 2020-06-15 ENCOUNTER — Encounter: Payer: Self-pay | Admitting: Primary Care

## 2020-06-15 ENCOUNTER — Ambulatory Visit (INDEPENDENT_AMBULATORY_CARE_PROVIDER_SITE_OTHER)
Admission: RE | Admit: 2020-06-15 | Discharge: 2020-06-15 | Disposition: A | Discharge status: Home / Self Care | Payer: Medicare Other | Source: Ambulatory Visit | Attending: Primary Care | Admitting: Primary Care

## 2020-06-15 VITALS — BP 132/64 | HR 70 | Temp 97.4°F | Ht 65.0 in | Wt 264.0 lb

## 2020-06-15 DIAGNOSIS — R0789 Other chest pain: Secondary | ICD-10-CM

## 2020-06-15 DIAGNOSIS — E119 Type 2 diabetes mellitus without complications: Secondary | ICD-10-CM | POA: Diagnosis not present

## 2020-06-15 DIAGNOSIS — R3912 Poor urinary stream: Secondary | ICD-10-CM

## 2020-06-15 DIAGNOSIS — U071 COVID-19: Secondary | ICD-10-CM

## 2020-06-15 DIAGNOSIS — I10 Essential (primary) hypertension: Secondary | ICD-10-CM | POA: Diagnosis not present

## 2020-06-15 DIAGNOSIS — G8929 Other chronic pain: Secondary | ICD-10-CM | POA: Diagnosis not present

## 2020-06-15 DIAGNOSIS — R079 Chest pain, unspecified: Secondary | ICD-10-CM | POA: Diagnosis not present

## 2020-06-15 HISTORY — DX: COVID-19: U07.1

## 2020-06-15 LAB — CBC WITH DIFFERENTIAL/PLATELET
Basophils Absolute: 0.1 10*3/uL (ref 0.0–0.1)
Basophils Relative: 0.9 % (ref 0.0–3.0)
Eosinophils Absolute: 0.2 10*3/uL (ref 0.0–0.7)
Eosinophils Relative: 3.5 % (ref 0.0–5.0)
HCT: 43 % (ref 39.0–52.0)
Hemoglobin: 14.7 g/dL (ref 13.0–17.0)
Lymphocytes Relative: 27.2 % (ref 12.0–46.0)
Lymphs Abs: 1.7 10*3/uL (ref 0.7–4.0)
MCHC: 34.3 g/dL (ref 30.0–36.0)
MCV: 89.5 fl (ref 78.0–100.0)
Monocytes Absolute: 0.5 10*3/uL (ref 0.1–1.0)
Monocytes Relative: 8.4 % (ref 3.0–12.0)
Neutro Abs: 3.9 10*3/uL (ref 1.4–7.7)
Neutrophils Relative %: 60 % (ref 43.0–77.0)
Platelets: 234 10*3/uL (ref 150.0–400.0)
RBC: 4.8 Mil/uL (ref 4.22–5.81)
RDW: 14.3 % (ref 11.5–15.5)
WBC: 6.4 10*3/uL (ref 4.0–10.5)

## 2020-06-15 LAB — COMPREHENSIVE METABOLIC PANEL
ALT: 18 U/L (ref 0–53)
AST: 16 U/L (ref 0–37)
Albumin: 3.9 g/dL (ref 3.5–5.2)
Alkaline Phosphatase: 69 U/L (ref 39–117)
BUN: 19 mg/dL (ref 6–23)
CO2: 33 mEq/L — ABNORMAL HIGH (ref 19–32)
Calcium: 9.1 mg/dL (ref 8.4–10.5)
Chloride: 103 mEq/L (ref 96–112)
Creatinine, Ser: 0.74 mg/dL (ref 0.40–1.50)
GFR: 93.94 mL/min (ref >60.00)
Glucose, Bld: 246 mg/dL — ABNORMAL HIGH (ref 70–99)
Potassium: 4.5 mEq/L (ref 3.5–5.1)
Sodium: 141 mEq/L (ref 135–145)
Total Bilirubin: 0.8 mg/dL (ref 0.2–1.2)
Total Protein: 6.8 g/dL (ref 6.0–8.3)

## 2020-06-15 LAB — POCT GLYCOSYLATED HEMOGLOBIN (HGB A1C): Hemoglobin A1C: 6.4 % — AB (ref 4.0–5.6)

## 2020-06-15 LAB — PSA, MEDICARE: PSA: 0.78 ng/ml (ref 0.10–4.00)

## 2020-06-15 MED ORDER — ATORVASTATIN CALCIUM 20 MG PO TABS: 20.0000 mg | ORAL_TABLET | Freq: Every day | ORAL | 1 refills | Status: DC

## 2020-06-15 NOTE — Patient Instructions (Signed)
Stop by the lab and xray prior to leaving today. I will notify you of your results once received.   You will be contacted regarding your referral to the Urologist.  Please let us know if you have not been contacted within two weeks.   Schedule a physical with me for July 2022.  It was a pleasure to see you today!

## 2020-06-15 NOTE — Assessment & Plan Note (Signed)
Controlled with A1C of 6.4 today. Continue glipizide XL 10 mg daily. Discussed not to increase this dose.  Pneumonia vaccine UTD. Foot exam UTD. Eye exam UTD. Refilled statin as he's not taken in months.   Follow up in July for CPE.

## 2020-06-15 NOTE — Assessment & Plan Note (Signed)
Overall stable on lisinopril 20 mg and metoprolol tartrate 25 mg BID.   Offered to change lisinopril as he does seem to experience an ACE-I cough outside and during Covid. He declines for now.

## 2020-06-15 NOTE — Assessment & Plan Note (Signed)
Chronic for years, also with "dark urine" for one year, although this is the first I am hearing of this.  He cannot provide a urine sample today. Checking labs including PSA. Referral placed to Urology as tamsulosin has been ineffective at max dose.

## 2020-06-15 NOTE — Assessment & Plan Note (Signed)
Diagnosed in January 2022, seems to be nearly resolved with some residual head congestion and cough. Lisinopril could be contributing to cough, he declines to switch meds.  Repeat chest xray pending today. He does not appear acutely ill, lungs clear, no cough during visit.

## 2020-06-15 NOTE — Progress Notes (Signed)
 Subjective:    Patient ID: Eric Hill, male    DOB: 02/07/1951, 70 y.o.   MRN: [id number]  HPI  This visit occurred during the SARS-CoV-2 public health emergency.  Safety protocols were in place, including screening questions prior to the visit, additional usage of staff PPE, and extensive cleaning of exam room while observing appropriate contact time as indicated for disinfecting solutions.   Eric Hill is a 70 year old male with a history of type 2 diabetes, hyperlipidemia, hypertension, MDD who presents today for follow up of diabetes and Covid-19 infection. He has several other concerns.   1) Covid-19: Diagnosed at [facility] 6 weeks ago, underwent chest xray which showed "pneumonititis", also tested positive for Covid-19. He was treated with IM rocephin 1 g and Azithromycin course.   Since his urgent care visit he's undergone repeat chest xray (02/01) which showed "very mild left basilar opacity, atelectasis or mind infection from Covid-19".  Initial symptoms of Covid included diaphoresis, shortness of breath, cough, fevers. Most symptoms have resolved except for a residual cough, improved today. His cough is worst when laying down at night, and when first getting up in the morning, sometimes feels like a tickle, left lower anterior chest pressure. Also with rhinorrhea and head pressure. He is requesting some Amoxicillin to "get rid of this mess".   He increased his lisinopril to 30 mg during Covid-19 symptoms due to elevated blood pressure readings. He has noticed an intermittent cough outside of Covid which was not bothersome.   2) Type 2 Diabetes:  Current medications include: Glipizide XL 10 mg. He increased his glipizide to 2 tablets during Covid-19 symptoms due to elevated glucose readings in the 400's.   He is checking his blood glucose 1-2 times daily and is getting readings of mid 100's to low 200's. During his Covid symptoms his glucose readings increased to  450's.   Last A1C: 6.6 in July 2021, 6.4 today Last Eye Exam: UTD Last Foot Exam: UTD Pneumonia Vaccination: 2021 ACE/ARB: Lisinopril  Statin: Lipitor, he has been out of his Lipitor for a few months.   BP Readings from Last 3 Encounters:  06/15/20 132/64  11/08/19 132/82  10/25/19 (!) 144/77   3) Weak Urinary Steam: Chronic for years, gradually progressing and worse since Covid-19 infection. Dark colored urine for the last one year. Currently managed on tamsulosin 0.4 mg BID for which is not helping with symptoms. He's not seen urology, is open to going.   4) Anterior Chest Pain: Chronic to the xyphoid process region and left lower anterior rib cage since 2008 when he fell into a "pit" at work. He worked as a mechanic at the time. He endorses falling into the same pit again a few yars later. Since then his urinary symptoms have not been the same. He endorses that he underwent xrays of his chest and MRI of the lumbar spine at the time, was told that he didn't have fractures but he disagrees. At the time, a lawyer was involved but he didn't care for the lawyer as "he was working with the doctor".   He continues to be bothered by this left lower rib cage pain, and since Covid has noticed pain with deep inspiration to that side. The pain is sharp as though someone is stabbing him.   Review of Systems  Constitutional: Negative for fatigue and fever.  HENT: Positive for congestion.   Eyes: Negative for visual disturbance.  Respiratory: Positive for cough.   Negative for shortness of breath.   Genitourinary:       Weak urinary stream  Musculoskeletal:       Chronic xyphoid and left lower anterior cage pain        Past Medical History:  Diagnosis Date  . Allergy    seasonal  . Cataract    no surgery yet  . Depression   . Diverticulitis   . Erectile dysfunction   . History of depression   . History of IBS    has abd pain  . Hyperlipidemia   . Hypertension   . Type 2 diabetes  mellitus (HCC)      Social History   Socioeconomic History  . Marital status: Single    Spouse name: Not on file  . Number of children: Not on file  . Years of education: Not on file  . Highest education level: Not on file  Occupational History  . Not on file  Tobacco Use  . Smoking status: Former Smoker    Quit date: 11/18/1990    Years since quitting: 29.5  . Smokeless tobacco: Never Used  Substance and Sexual Activity  . Alcohol use: Yes    Alcohol/week: 0.0 standard drinks    Comment: rarely  . Drug use: No  . Sexual activity: Not on file  Other Topics Concern  . Not on file  Social History Narrative   Single   Works as a diseal mechanic.   No children   Lives in [place]   Enjoys racing cars, drag racing.   Social Determinants of Health   Financial Resource Strain: Low Risk   . Difficulty of Paying Living Expenses: Not very hard  Food Insecurity: Not on file  Transportation Needs: Not on file  Physical Activity: Not on file  Stress: Not on file  Social Connections: Not on file  Intimate Partner Violence: Not on file    Past Surgical History:  Procedure Laterality Date  . COLONOSCOPY    . NO PAST SURGERIES      Family History  Problem Relation Age of Onset  . Neuropathy Mother   . Cancer Sister        Back, brain  . Cancer Maternal Grandfather        Abdominal   . Stomach cancer Maternal Grandfather   . Colon cancer Neg Hx   . Esophageal cancer Neg Hx   . Rectal cancer Neg Hx     No Known Allergies  Current Outpatient Medications on File Prior to Visit  Medication Sig Dispense Refill  . blood glucose meter kit and supplies KIT Dispense based on patient and insurance preference. Use up to 2-3 times daily as directed. Dx is E11.9 1 each 0  . glipiZIDE (GLUCOTROL XL) 10 MG 24 hr tablet TAKE 1 TABLET BY MOUTH IN THE MORNING WITH FOOD FOR DIABETES 90 tablet 1  . lisinopril (ZESTRIL) 20 MG tablet TAKE 1 TABLET (20 MG TOTAL) BY MOUTH DAILY. FOR  BLOOD PRESSURE 90 tablet 1  . metoprolol tartrate (LOPRESSOR) 25 MG tablet TAKE 1 TABLET BY MOUTH TWICE A DAY 180 tablet 3  . psyllium (METAMUCIL) 58.6 % powder Take 1 packet by mouth as needed.    . sertraline (ZOLOFT) 50 MG tablet Take 1.5 tablets (75 mg total) by mouth daily. For depression. 135 tablet 3  . tamsulosin (FLOMAX) 0.4 MG CAPS capsule TAKE 1 CAPSULE BY MOUTH ONCE DAILY WITH BREAKFAST FOR URINE FLOW 90 capsule 1  . Vitamin D,   Ergocalciferol, (DRISDOL) 1.25 MG (50000 UNIT) CAPS capsule Take 1 capsule by mouth once weekly for vitamin D. 12 capsule 1   No current facility-administered medications on file prior to visit.    BP 132/64   Pulse 70   Temp (!) 97.4 F (36.3 C) (Temporal)   Ht 5' 5" (1.651 m)   Wt 264 lb (119.7 kg)   SpO2 97%   BMI 43.93 kg/m    Objective:   Physical Exam Constitutional:      Appearance: He is well-nourished. He is not ill-appearing.  Cardiovascular:     Rate and Rhythm: Normal rate and regular rhythm.  Pulmonary:     Effort: Pulmonary effort is normal.     Breath sounds: Normal breath sounds.  Musculoskeletal:     Cervical back: Neck supple.  Skin:    General: Skin is warm and dry.  Psychiatric:        Mood and Affect: Mood and affect normal.            Assessment & Plan:   

## 2020-06-15 NOTE — Assessment & Plan Note (Signed)
Chronic since fall in 2008. Exam today without abnormality.   Checking chest xray and d-dimer today given Covid-19 diagnosis.

## 2020-06-16 ENCOUNTER — Telehealth: Payer: Self-pay | Admitting: Family Medicine

## 2020-06-16 LAB — D-DIMER, QUANTITATIVE: D-Dimer, Quant: 1.58 mcg/mL FEU — ABNORMAL HIGH (ref <0.50)

## 2020-06-16 NOTE — Telephone Encounter (Signed)
Call received from access nurse have not been able to reach patient. Let them know I will continue to contact patient.  I have called number on file and left message. Emergency contact number is d/c. I have called Kurtis Elledge ( listed on DPR) and left message for him on his v/m to have patient call as soon as possible. Will continue to call patient.

## 2020-06-16 NOTE — Telephone Encounter (Signed)
Called patient at 10:04,11:08, 12:05 and 12:41. Left messages all times called. Will continue to call patient until able to reach

## 2020-06-16 NOTE — Telephone Encounter (Signed)
[place] on call MD  Patient with covid 19 about 6 weeks ago per PCP notes. Appeared to have pleuritic chest pain and d dimer was drawn- I assume for concern of PE. D dimer elevated at 1.58- somewhat hard to interpret given prior covid diagnosis but with concern for potential PE recommended ER visit- after hours nursing line will instruct patient.

## 2020-06-17 ENCOUNTER — Other Ambulatory Visit: Payer: Self-pay | Admitting: Primary Care

## 2020-06-17 DIAGNOSIS — R7989 Other specified abnormal findings of blood chemistry: Secondary | ICD-10-CM

## 2020-06-17 DIAGNOSIS — R071 Chest pain on breathing: Secondary | ICD-10-CM

## 2020-06-17 NOTE — Telephone Encounter (Signed)
Noted, see lab result note.

## 2020-06-18 NOTE — Telephone Encounter (Signed)
[facility] Night - Client Nonclinical Telephone Record  [facility] Client [facility] Night - Client Client Site [place] Physician Alma Friendly - NP Contact Type Call Who Is Calling Patient / Member / Family / Caregiver Caller Name Eric Hill Caller Phone Number [phone] Call Type Message Only Information Provided Reason for Call Returning a Call from the Office Initial Eric Hill states that he is returning a call to the office. Declined triage. Disp. Time Disposition Final User 06/16/2020 12:52:41 PM General Information Provided Yes Windy Canny Call Closed By: Windy Canny Transaction Date/Time: 06/16/2020 12:50:44 PM (ET)

## 2020-06-18 NOTE — Telephone Encounter (Signed)
Called patient on 06/17/2020 at 2:00, 4:00 and 6:00 not able to reach patient. See lab results for further documentation.

## 2020-06-18 NOTE — Telephone Encounter (Signed)
[facility] Night - Client TELEPHONE ADVICE RECORD [facility] Patient Name: Eric Hill Gender: Male DOB: 1951-03-23 Age: 70 Y 2 M 26 D Return Phone Number: Address: City/State/Zip: [place] Client [place] Night - Client Client Site [place] Physician Alma Friendly - NP Contact Type Call Who Is Calling Lab [facility] Name [facility] Phone Number [phone] Lab Tech Name stephanie Lab Reference Number [id number] R Chief Complaint Lab Result (Critical or Stat) Call Type Lab Send to RN Reason for Call Report lab results Initial Comment Caller states has critical lab result to report. Translation No Nurse Assessment Nurse: Renne Crigler, RN, Sherlie Ban Date/Time (Eastern Time): 06/16/2020 8:18:41 AM Confirm and document reason for call. If symptomatic, describe symptoms. ---Critical D-Dimer: 1.58 D-dimer (normal range: <0.50) Ordering Physician: Alma Friendly - NP Date Collected: 06-15-20 @ 1256 Patient Phone Number: [phone] Does the patient have any new or worsening symptoms? ---No Please document clinical information provided and list any resource used. ---Critical Lab Disp. Time Eilene Ghazi Time) Disposition Final User 06/16/2020 8:18:22 AM Lab Call Renne Crigler, RN, Sherlie Ban Reason: Critical lab 06/16/2020 8:49:42 AM Called On-Call Provider [place], RN, [facility] 06/16/2020 9:00:46 AM Attempt made - line busy Philis Kendall 06/16/2020 9:27:00 AM Attempt made - no message left Renne Crigler, RN, Sherlie Ban 06/16/2020 9:34:50 AM Send To RN Personal Renne Crigler, RN, Sherlie Ban 06/16/2020 9:35:19 AM Send To RN Personal Renne Crigler, RN, [facility] 06/16/2020 9:35:32 AM Clinical Call Renne Crigler, RN, Sherlie Ban 06/16/2020 9:35:50 AM Send To RN Personal Renne Crigler, RN, Sherlie Ban 06/16/2020 9:40:53 AM Called On-Call Provider Renne Crigler, RN, Sherlie Ban Reason: Called the [facility]. Spoke with Joellen. Informed nurse that I can not get in touch with patient to inform him to go to ED  d/t to elevated D-dimer levels. Nurse at clinic will take over and attempt to reach patient. Nurse states she is familiar with patient and he can be hard to reach. PLEASE NOTE: All timestamps contained within this report are represented as [place] Standard Time. CONFIDENTIALTY NOTICE: This fax transmission is intended only for the addressee. It contains information that is legally privileged, confidential or otherwise protected from use or disclosure. If you are not the intended recipient, you are strictly prohibited from reviewing, disclosing, copying using or disseminating any of this information or taking any action in reliance on or regarding this information. If you have received this fax in error, please notify us immediately by telephone so that we can arrange for its return to [place]. Phone: [phone], Toll-Free: [phone], Fax: [phone] Page: 2 of 2 Call Id: [id number] 06/16/2020 9:41:04 AM Clinical Call Yes Renne Crigler, RN, Sherlie Ban Comments User: Linus Orn, RN Date/Time Eilene Ghazi Time): 06/16/2020 9:26:44 AM Attempted to call patient 2nd time. No answer. Paging DoctorName Phone DateTime Result/Outcome Message Type Notes Dr. Garret Reddish [phone] 06/16/2020 8:49:42 AM Called On Call Provider - Reached Doctor Paged Dr. Garret Reddish 06/16/2020 8:54:53 AM Spoke with On Call - Physician Upgrade Message Result Send patient to ED - per on-call

## 2020-06-19 ENCOUNTER — Ambulatory Visit
Admission: RE | Admit: 2020-06-19 | Discharge: 2020-06-19 | Disposition: A | Discharge status: Home / Self Care | Payer: Medicare Other | Source: Ambulatory Visit | Attending: Primary Care | Admitting: Primary Care

## 2020-06-19 ENCOUNTER — Telehealth: Payer: Self-pay | Admitting: Primary Care

## 2020-06-19 ENCOUNTER — Ambulatory Visit: Payer: Medicare Other

## 2020-06-19 DIAGNOSIS — R071 Chest pain on breathing: Secondary | ICD-10-CM

## 2020-06-19 DIAGNOSIS — R918 Other nonspecific abnormal finding of lung field: Secondary | ICD-10-CM | POA: Diagnosis not present

## 2020-06-19 DIAGNOSIS — R7989 Other specified abnormal findings of blood chemistry: Secondary | ICD-10-CM

## 2020-06-19 DIAGNOSIS — I2510 Atherosclerotic heart disease of native coronary artery without angina pectoris: Secondary | ICD-10-CM | POA: Diagnosis not present

## 2020-06-19 DIAGNOSIS — J9811 Atelectasis: Secondary | ICD-10-CM | POA: Diagnosis not present

## 2020-06-19 MED ORDER — IOPAMIDOL (ISOVUE-370) INJECTION 76%
75.0000 mL | Freq: Once | INTRAVENOUS | Status: AC | PRN
  Administered 2020-06-19: 75 mL via INTRAVENOUS

## 2020-06-19 NOTE — Telephone Encounter (Signed)
Patients imaging is in Epic for viewing. EM

## 2020-06-19 NOTE — Telephone Encounter (Signed)
Noted, see result note  

## 2020-06-20 ENCOUNTER — Other Ambulatory Visit: Payer: Self-pay | Admitting: Primary Care

## 2020-06-20 ENCOUNTER — Other Ambulatory Visit: Payer: Self-pay

## 2020-06-20 MED ORDER — OLMESARTAN MEDOXOMIL 20 MG PO TABS: 20.0000 mg | ORAL_TABLET | Freq: Every day | ORAL | 1 refills | Status: DC

## 2020-06-29 ENCOUNTER — Telehealth: Payer: Medicare Other

## 2020-06-29 ENCOUNTER — Telehealth: Payer: Self-pay

## 2020-06-29 NOTE — Telephone Encounter (Addendum)
Reviewed patient chart prior to CCM follow up scheduled on 06/29/20 at 9 AM. Unable to reach patient today for follow up. Left VM with contact information. Pt missed last CCM follow up in Dec 2022.   Recent office visits:  06/17/20 - Lab results - elevated d-dimer, PCP requested stat CTA of chest to rule out PE.  06/15/20 - Chest X-ray follow up COVID. BP stable on lisinopril 20 mg daily and metoprolol 25 mg BID. He has a cough but declines med changes for now. A1c 6.4% on glipizide 10 mg daily. Discussed not to increase this dose. Eye and foot exam UTD. Refilled statin as he has not taken in months. Refer to urology as tamsulosin max dose ineffective.  Ria Comment will you try to reach patient this month - Would like to see how patient is doing on statin and adherence. Also would like to make sure he is not taking both lisinopril and olmesartan. Both listed on recent refill history. He was changed to Olmesartan due to cough with lisinopril on 06/20/20. Please see if would schedule a f/u with me in April to review medication therapy.  Debbora Dus, PharmD Clinical Pharmacist [facility] at [facility] [phone]  Total time spent for month: 27 min

## 2020-07-10 ENCOUNTER — Telehealth: Payer: Self-pay

## 2020-07-10 NOTE — Chronic Care Management (AMB) (Addendum)
Chronic Care Management Pharmacy Assistant   Name: Eric Hill  MRN: [id number] DOB: November 02, 1950  Reason for Encounter: Statin Adherence and Schedule CCM Follow up   Conditions to be addressed/monitored: HTN and [facility] visits:  None in previous 6 months  Medications: Outpatient Encounter Medications as of 07/10/2020  Medication Sig   olmesartan (BENICAR) 20 MG tablet Take 1 tablet (20 mg total) by mouth daily. One tablet daily for blood pressure.   atorvastatin (LIPITOR) 20 MG tablet Take 1 tablet (20 mg total) by mouth daily. For cholesterol.   blood glucose meter kit and supplies KIT Dispense based on patient and insurance preference. Use up to 2-3 times daily as directed. Dx is E11.9   glipiZIDE (GLUCOTROL XL) 10 MG 24 hr tablet TAKE 1 TABLET BY MOUTH IN THE MORNING WITH FOOD FOR DIABETES   metoprolol tartrate (LOPRESSOR) 25 MG tablet TAKE 1 TABLET BY MOUTH TWICE A DAY   psyllium (METAMUCIL) 58.6 % powder Take 1 packet by mouth as needed.   sertraline (ZOLOFT) 50 MG tablet Take 1.5 tablets (75 mg total) by mouth daily. For depression.   tamsulosin (FLOMAX) 0.4 MG CAPS capsule TAKE 1 CAPSULE BY MOUTH ONCE DAILY WITH BREAKFAST FOR URINE FLOW   Vitamin D, Ergocalciferol, (DRISDOL) 1.25 MG (50000 UNIT) CAPS capsule Take 1 capsule by mouth once weekly for vitamin D.   No facility-administered encounter medications on file as of 07/10/2020.    Recent Office Vitals: BP Readings from Last 3 Encounters:  06/15/20 132/64  11/08/19 132/82  10/25/19 (!) 144/77   Pulse Readings from Last 3 Encounters:  06/15/20 70  11/08/19 66  10/25/19 73    Wt Readings from Last 3 Encounters:  06/15/20 264 lb (119.7 kg)  11/08/19 266 lb 4 oz (120.8 kg)  10/25/19 250 lb (113.4 kg)     Kidney Function Lab Results  Component Value Date/Time   CREATININE 0.74 06/15/2020 12:56 PM   CREATININE 0.88 11/03/2019 09:33 AM   GFR 93.94 06/15/2020 12:56 PM    BMP Latest Ref Rng &  Units 06/15/2020 11/03/2019 11/01/2018  Glucose 70 - 99 mg/dL 246(H) 217(H) 150(H)  BUN 6 - 23 mg/dL '19 20 17  ' Creatinine 0.40 - 1.50 mg/dL 0.74 0.88 0.81  Sodium 135 - 145 mEq/L 141 136 138  Potassium 3.5 - 5.1 mEq/L 4.5 4.4 4.5  Chloride 96 - 112 mEq/L 103 100 103  CO2 19 - 32 mEq/L 33(H) 28 28  Calcium 8.4 - 10.5 mg/dL 9.1 9.1 8.7   Spoke with patient states that he is taking Olmesartan, not lisinopril Eric Hill was concerned he had refilled both medications). He seems to be doing well on this, no cough. He is taking the atorvastatin and denies any ill effects such as muscle cramps or aches. Attempted to make follow up appointment with Eric Hill for April but patient states that he has an upcoming appointment and is not sure what will happen after that. He agreed to a follow up call in about 2 weeks to schedule this appointment.   Star Rating Drugs:  Medication:  Last Fill: Day Supply Atorvastatin 20 mg 06/15/20 90 Lisinopril 20 mg 04/25/20 90  - not taking due to cough Olmesartan 20 mg 06/20/20 90 Glipizide ER 10 mg 04/26/20 90  Follow-Up:  Pharmacist Review  Eric Hill, CPP notified  Eric Hill, [place] Assistant [phone]  I have reviewed the care management and care coordination activities outlined in this encounter and I am  certifying that I agree with the content of this note. No further action required.  Eric Hill, PharmD Clinical Pharmacist [facility] at [facility] [phone]

## 2020-07-16 ENCOUNTER — Telehealth: Payer: Self-pay

## 2020-07-16 NOTE — Telephone Encounter (Signed)
-----   Message from [place] sent at 07/16/2020  3:03 PM EDT ----- Regarding: Refill Contact: [phone] Spoke with patient for CCM call. He is requesting refills be sent to [facility] on the following medications: Glipizide Tamsulosin Sertraline  Thanks, [employer]

## 2020-07-17 ENCOUNTER — Other Ambulatory Visit: Payer: Self-pay | Admitting: Primary Care

## 2020-07-17 ENCOUNTER — Other Ambulatory Visit: Payer: Self-pay

## 2020-07-17 DIAGNOSIS — E119 Type 2 diabetes mellitus without complications: Secondary | ICD-10-CM

## 2020-07-17 DIAGNOSIS — R3915 Urgency of urination: Secondary | ICD-10-CM

## 2020-07-17 DIAGNOSIS — F320 Major depressive disorder, single episode, mild: Secondary | ICD-10-CM

## 2020-07-17 MED ORDER — SERTRALINE HCL 50 MG PO TABS: 75.0000 mg | ORAL_TABLET | Freq: Every day | ORAL | 0 refills | Status: DC

## 2020-07-17 MED ORDER — TAMSULOSIN HCL 0.4 MG PO CAPS: ORAL_CAPSULE | ORAL | 1 refills | Status: DC

## 2020-07-17 MED ORDER — GLIPIZIDE ER 10 MG PO TB24: ORAL_TABLET | ORAL | 1 refills | Status: DC

## 2020-07-17 NOTE — Telephone Encounter (Signed)
Refills called in 

## 2020-07-30 ENCOUNTER — Telehealth: Payer: Self-pay

## 2020-07-30 NOTE — Chronic Care Management (AMB) (Addendum)
    Chronic Care Management Pharmacy Assistant   Name: SHAIL URBAS  MRN: [id number] DOB: 27-Mar-1951  Reason for Encounter: Schedule CCM Follow-Up   Recent office visits:  None since last CCM contact  Recent consult visits:  None since last CCM contact  Hospital visits:  None in previous 6 months  Medications: Outpatient Encounter Medications as of 07/30/2020  Medication Sig   olmesartan (BENICAR) 20 MG tablet Take 1 tablet (20 mg total) by mouth daily. One tablet daily for blood pressure.   atorvastatin (LIPITOR) 20 MG tablet Take 1 tablet (20 mg total) by mouth daily. For cholesterol.   blood glucose meter kit and supplies KIT Dispense based on patient and insurance preference. Use up to 2-3 times daily as directed. Dx is E11.9   glipiZIDE (GLUCOTROL XL) 10 MG 24 hr tablet TAKE 1 TABLET BY MOUTH IN THE MORNING WITH FOOD FOR DIABETES   metoprolol tartrate (LOPRESSOR) 25 MG tablet TAKE 1 TABLET BY MOUTH TWICE A DAY   psyllium (METAMUCIL) 58.6 % powder Take 1 packet by mouth as needed.   sertraline (ZOLOFT) 50 MG tablet Take 1 tablet (50 mg total) by mouth daily. For depression.   tamsulosin (FLOMAX) 0.4 MG CAPS capsule TAKE 1 CAPSULE BY MOUTH ONCE DAILY WITH BREAKFAST FOR URINE FLOW   Vitamin D, Ergocalciferol, (DRISDOL) 1.25 MG (50000 UNIT) CAPS capsule Take 1 capsule by mouth once weekly for vitamin D.   No facility-administered encounter medications on file as of 07/30/2020.    Multiple attempts made to reach Mr. Creger on 07/30/20, 08/01/20, 08/03/20 to reschedule follow up with Debbora Dus. Will attempt to reach Mr. Mitrano in June Patient missed last appointment in March with Sharyn Lull.   Star rating drugs Medication  Last Fill Day Supply Olmesartan 20 mg 06/20/2020 90 Atorvastatin 20 mg 06/15/20 90 Glipizide 10 mg 04/26/20 90   Follow-Up:  Pharmacist Review  Debbora Dus, CPP notified  Margaretmary Dys, [place] Assistant [phone]  I have  reviewed the care management and care coordination activities outlined in this encounter and I am certifying that I agree with the content of this note. No further action required.  Debbora Dus, PharmD Clinical Pharmacist [facility] at [facility] [phone]

## 2020-08-30 DIAGNOSIS — S61412A Laceration without foreign body of left hand, initial encounter: Secondary | ICD-10-CM | POA: Diagnosis not present

## 2020-09-07 DIAGNOSIS — Z4802 Encounter for removal of sutures: Secondary | ICD-10-CM | POA: Diagnosis not present

## 2020-09-07 DIAGNOSIS — L0390 Cellulitis, unspecified: Secondary | ICD-10-CM | POA: Diagnosis not present

## 2020-10-13 ENCOUNTER — Other Ambulatory Visit: Payer: Self-pay | Admitting: Primary Care

## 2020-10-13 DIAGNOSIS — F320 Major depressive disorder, single episode, mild: Secondary | ICD-10-CM

## 2020-10-17 ENCOUNTER — Other Ambulatory Visit: Payer: Self-pay | Admitting: Primary Care

## 2020-10-17 DIAGNOSIS — I10 Essential (primary) hypertension: Secondary | ICD-10-CM

## 2020-11-13 ENCOUNTER — Ambulatory Visit (INDEPENDENT_AMBULATORY_CARE_PROVIDER_SITE_OTHER): Payer: Medicare Other | Admitting: Primary Care

## 2020-11-13 ENCOUNTER — Other Ambulatory Visit: Payer: Self-pay | Admitting: Primary Care

## 2020-11-13 ENCOUNTER — Other Ambulatory Visit: Payer: Self-pay

## 2020-11-13 ENCOUNTER — Encounter: Payer: Self-pay | Admitting: Primary Care

## 2020-11-13 VITALS — BP 126/62 | HR 76 | Temp 97.0°F | Ht 65.0 in | Wt 266.0 lb

## 2020-11-13 DIAGNOSIS — E119 Type 2 diabetes mellitus without complications: Secondary | ICD-10-CM

## 2020-11-13 DIAGNOSIS — I10 Essential (primary) hypertension: Secondary | ICD-10-CM | POA: Diagnosis not present

## 2020-11-13 DIAGNOSIS — R002 Palpitations: Secondary | ICD-10-CM | POA: Diagnosis not present

## 2020-11-13 DIAGNOSIS — R3912 Poor urinary stream: Secondary | ICD-10-CM | POA: Diagnosis not present

## 2020-11-13 DIAGNOSIS — Z0001 Encounter for general adult medical examination with abnormal findings: Secondary | ICD-10-CM

## 2020-11-13 DIAGNOSIS — E559 Vitamin D deficiency, unspecified: Secondary | ICD-10-CM

## 2020-11-13 DIAGNOSIS — K219 Gastro-esophageal reflux disease without esophagitis: Secondary | ICD-10-CM

## 2020-11-13 DIAGNOSIS — R3915 Urgency of urination: Secondary | ICD-10-CM

## 2020-11-13 DIAGNOSIS — Z125 Encounter for screening for malignant neoplasm of prostate: Secondary | ICD-10-CM | POA: Diagnosis not present

## 2020-11-13 DIAGNOSIS — F320 Major depressive disorder, single episode, mild: Secondary | ICD-10-CM

## 2020-11-13 LAB — MICROALBUMIN / CREATININE URINE RATIO
Creatinine,U: 173.4 mg/dL
Microalb Creat Ratio: 0.4 mg/g (ref 0.0–30.0)
Microalb, Ur: <0.7 mg/dL (ref 0.0–1.9)

## 2020-11-13 LAB — COMPREHENSIVE METABOLIC PANEL
ALT: 19 U/L (ref 0–53)
AST: 22 U/L (ref 0–37)
Albumin: 4.3 g/dL (ref 3.5–5.2)
Alkaline Phosphatase: 82 U/L (ref 39–117)
BUN: 29 mg/dL — ABNORMAL HIGH (ref 6–23)
CO2: 28 mEq/L (ref 19–32)
Calcium: 9.5 mg/dL (ref 8.4–10.5)
Chloride: 102 mEq/L (ref 96–112)
Creatinine, Ser: 0.79 mg/dL (ref 0.40–1.50)
GFR: 91.83 mL/min (ref >60.00)
Glucose, Bld: 179 mg/dL — ABNORMAL HIGH (ref 70–99)
Potassium: 4.6 mEq/L (ref 3.5–5.1)
Sodium: 138 mEq/L (ref 135–145)
Total Bilirubin: 0.7 mg/dL (ref 0.2–1.2)
Total Protein: 7 g/dL (ref 6.0–8.3)

## 2020-11-13 LAB — LIPID PANEL
Cholesterol: 118 mg/dL (ref 0–200)
HDL: 41.5 mg/dL (ref >39.00)
LDL Cholesterol: 50 mg/dL (ref 0–99)
NonHDL: 76.22
Total CHOL/HDL Ratio: 3
Triglycerides: 130 mg/dL (ref 0.0–149.0)
VLDL: 26 mg/dL (ref 0.0–40.0)

## 2020-11-13 LAB — HEMOGLOBIN A1C: Hgb A1c MFr Bld: 7.3 % — ABNORMAL HIGH (ref 4.6–6.5)

## 2020-11-13 LAB — VITAMIN D 25 HYDROXY (VIT D DEFICIENCY, FRACTURES): VITD: 12.07 ng/mL — ABNORMAL LOW (ref 30.00–100.00)

## 2020-11-13 LAB — PSA, MEDICARE: PSA: 0.5 ng/ml (ref 0.10–4.00)

## 2020-11-13 MED ORDER — SERTRALINE HCL 100 MG PO TABS: 100.0000 mg | ORAL_TABLET | Freq: Every day | ORAL | 3 refills | Status: DC

## 2020-11-13 MED ORDER — VITAMIN D (ERGOCALCIFEROL) 1.25 MG (50000 UNIT) PO CAPS: ORAL_CAPSULE | ORAL | 1 refills | Status: DC

## 2020-11-13 MED ORDER — TAMSULOSIN HCL 0.4 MG PO CAPS: 0.8000 mg | ORAL_CAPSULE | Freq: Every day | ORAL | 2 refills | Status: DC

## 2020-11-13 NOTE — Assessment & Plan Note (Addendum)
Chronic and continued, agree to increased dose of Zoloft to 100 mg as this has been effective for him already. Will change Rx.

## 2020-11-13 NOTE — Assessment & Plan Note (Signed)
Improved, but continues with symptoms, has noticed improvement with 2 capsules.  Will increase his dose to 0.8 mg, he will update.

## 2020-11-13 NOTE — Assessment & Plan Note (Signed)
Compliant to Glipizide XL 10 mg. Repeat A1C pending.  Urine microalbumin pending. Pneumonia vaccine UTD. Eye exam UTD.  Follow up in 3-6 months.

## 2020-11-13 NOTE — Assessment & Plan Note (Signed)
Not taking vitamin D.  Repeat level pending.

## 2020-11-13 NOTE — Assessment & Plan Note (Addendum)
Continued, improved on metoprolol tartrate 25 mg for which he mostly takes once daily, uses PM dose if needed. Continue same.

## 2020-11-13 NOTE — Assessment & Plan Note (Signed)
Well controlled in the office today, continue olmesartan 20 mg. CMP pending.

## 2020-11-13 NOTE — Patient Instructions (Signed)
We increased your dose of sertraline (Zoloft) for depression to 100 mg. Only take one of these tablets once daily.  You can take two of the tamsulosin (Flomax) 0.4 mg capsules for urine flow and urgency.   Start taking omeprazole 20 mg once nightly at bedtime for heartburn and cough.   Stop by the lab prior to leaving today. I will notify you of your results once received.   Please schedule a follow up visit for 6 months for diabetes check.  It was a pleasure to see you today!  Preventive Care 68 Years and Older, Male Preventive care refers to lifestyle choices and visits with your health care provider that can promote health and wellness. This includes: A yearly physical exam. This is also called an annual wellness visit. Regular dental and eye exams. Immunizations. Screening for certain conditions. Healthy lifestyle choices, such as: Eating a healthy diet. Getting regular exercise. Not using drugs or products that contain nicotine and tobacco. Limiting alcohol use. What can I expect for my preventive care visit? Physical exam Your health care provider will check your: Height and weight. These may be used to calculate your BMI (body mass index). BMI is a measurement that tells if you are at a healthy weight. Heart rate and blood pressure. Body temperature. Skin for abnormal spots. Counseling Your health care provider may ask you questions about your: Past medical problems. Family's medical history. Alcohol, tobacco, and drug use. Emotional well-being. Home life and relationship well-being. Sexual activity. Diet, exercise, and sleep habits. History of falls. Memory and ability to understand (cognition). Work and work Statistician. Access to firearms. What immunizations do I need?  Vaccines are usually given at various ages, according to a schedule. Your health care provider will recommend vaccines for you based on your age, medicalhistory, and lifestyle or other factors,  such as travel or where you work. What tests do I need? Blood tests Lipid and cholesterol levels. These may be checked every 5 years, or more often depending on your overall health. Hepatitis C test. Hepatitis B test. Screening Lung cancer screening. You may have this screening every year starting at age 78 if you have a 30-pack-year history of smoking and currently smoke or have quit within the past 15 years. Colorectal cancer screening. All adults should have this screening starting at age 55 and continuing until age 39. Your health care provider may recommend screening at age 61 if you are at increased risk. You will have tests every 1-10 years, depending on your results and the type of screening test. Prostate cancer screening. Recommendations will vary depending on your family history and other risks. Genital exam to check for testicular cancer or hernias. Diabetes screening. This is done by checking your blood sugar (glucose) after you have not eaten for a while (fasting). You may have this done every 1-3 years. Abdominal aortic aneurysm (AAA) screening. You may need this if you are a current or former smoker. STD (sexually transmitted disease) testing, if you are at risk. Follow these instructions at home: Eating and drinking  Eat a diet that includes fresh fruits and vegetables, whole grains, lean protein, and low-fat dairy products. Limit your intake of foods with high amounts of sugar, saturated fats, and salt. Take vitamin and mineral supplements as recommended by your health care provider. Do not drink alcohol if your health care provider tells you not to drink. If you drink alcohol: Limit how much you have to 0-2 drinks a day. Be aware of  how much alcohol is in your drink. In the [place] one drink equals one 12 oz bottle of beer (355 mL), one 5 oz glass of wine (148 mL), or one 1 oz glass of hard liquor (44 mL).  Lifestyle Take daily care of your teeth and gums. Brush your  teeth every morning and night with fluoride toothpaste. Floss one time each day. Stay active. Exercise for at least 30 minutes 5 or more days each week. Do not use any products that contain nicotine or tobacco, such as cigarettes, e-cigarettes, and chewing tobacco. If you need help quitting, ask your health care provider. Do not use drugs. If you are sexually active, practice safe sex. Use a condom or other form of protection to prevent STIs (sexually transmitted infections). Talk with your health care provider about taking a low-dose aspirin or statin. Find healthy ways to cope with stress, such as: Meditation, yoga, or listening to music. Journaling. Talking to a trusted person. Spending time with friends and family. Safety Always wear your seat belt while driving or riding in a vehicle. Do not drive: If you have been drinking alcohol. Do not ride with someone who has been drinking. When you are tired or distracted. While texting. Wear a helmet and other protective equipment during sports activities. If you have firearms in your house, make sure you follow all gun safety procedures. What's next? Visit your health care provider once a year for an annual wellness visit. Ask your health care provider how often you should have your eyes and teeth checked. Stay up to date on all vaccines. This information is not intended to replace advice given to you by your health care provider. Make sure you discuss any questions you have with your healthcare provider. Document Revised: 01/11/2019 Document Reviewed: 04/08/2018 Elsevier Patient Education  2022 [employer].

## 2020-11-13 NOTE — Assessment & Plan Note (Signed)
Improved but still with symptoms, does better with 0.8 mg dose. Will change Rx for Flomax to take 0.8 mg daily.

## 2020-11-13 NOTE — Progress Notes (Signed)
Subjective:    Patient ID: Eric Hill, male    DOB: 1950/08/19, 70 y.o.   MRN: [id number]  HPI  Eric Hill is a very pleasant 70 y.o. male who presents today for complete physical. He would also like to discuss a few issues.   He would also like to discuss chronic cough, waxes and wanes, mostly noticeable in the morning and when laying down at night. Also with post nasal drip, heartburn. He will wake up at night with belching and acid in his mouth. He takes Tums intermittently with improvement. Prior smoker, quit 30 years ago.   He continues to feel down/depressed. Has felt better overall on Zoloft 75 mg, has taken 100 mg at times with improvement in symptoms. He cannot get out of bed some days, little motivation do to things.   Continued urinary urgency and decreased urine flow. Improved with tamsulosin 0.4 mg daily, but still with symptoms. He has taken 0.4 mg twice daily a few times with improvement.   Immunizations: -Tetanus: 2017 -Influenza: Due this season  -Covid-19: Has not completed  -Shingles: Did not completed  -Pneumonia: Completed series  Diet: Fair diet. No soft drinks, limiting fast food and junk food. Some salads Exercise: No regular exercise. He is active at home at times.   Eye exam: Completes annually  Dental exam: Due, he will schedule.   Colonoscopy: Completed in 2020, due in 2030 PSA: Due   BP Readings from Last 3 Encounters:  11/13/20 126/62  06/15/20 132/64  11/08/19 132/82       Review of Systems  Constitutional:  Negative for unexpected weight change.  HENT:  Negative for rhinorrhea.   Eyes:  Negative for visual disturbance.  Respiratory:  Positive for cough. Negative for shortness of breath.   Cardiovascular:  Positive for palpitations. Negative for chest pain.  Gastrointestinal:  Negative for constipation and diarrhea.       GERD  Genitourinary:  Negative for difficulty urinating.  Musculoskeletal:  Negative for  arthralgias and myalgias.  Skin:  Negative for rash.  Allergic/Immunologic: Positive for environmental allergies.  Neurological:  Negative for dizziness and headaches.  Psychiatric/Behavioral:         See HPI        Past Medical History:  Diagnosis Date   Allergy    seasonal   Cataract    no surgery yet   Depression    Diverticulitis    Erectile dysfunction    History of depression    History of IBS    has abd pain   Hyperlipidemia    Hypertension    Type 2 diabetes mellitus ([place])     Social History   Socioeconomic History   Marital status: Single    Spouse name: Not on file   Number of children: Not on file   Years of education: Not on file   Highest education level: Not on file  Occupational History   Not on file  Tobacco Use   Smoking status: Former    Types: Cigarettes    Quit date: 11/18/1990    Years since quitting: 30.0   Smokeless tobacco: Never  Substance and Sexual Activity   Alcohol use: Yes    Alcohol/week: 0.0 standard drinks    Comment: rarely   Drug use: No   Sexual activity: Not on file  Other Topics Concern   Not on file  Social History Narrative   Single   Works as a Mudlogger.   No  children   Lives in [place]   Enjoys racing cars, Designer, fashion/clothing.   Social Determinants of Health   Financial Resource Strain: Low Risk    Difficulty of Paying Living Expenses: Not very hard  Food Insecurity: Not on file  Transportation Needs: Not on file  Physical Activity: Not on file  Stress: Not on file  Social Connections: Not on file  Intimate Partner Violence: Not on file    Past Surgical History:  Procedure Laterality Date   COLONOSCOPY     NO PAST SURGERIES      Family History  Problem Relation Age of Onset   Neuropathy Mother    Cancer Sister        Back, brain   Cancer Maternal Grandfather        Abdominal    Stomach cancer Maternal Grandfather    Colon cancer Neg Hx    Esophageal cancer Neg Hx    Rectal cancer Neg  Hx     No Known Allergies  Current Outpatient Medications on File Prior to Visit  Medication Sig Dispense Refill   atorvastatin (LIPITOR) 20 MG tablet Take 1 tablet (20 mg total) by mouth daily. For cholesterol. 90 tablet 1   blood glucose meter kit and supplies KIT Dispense based on patient and insurance preference. Use up to 2-3 times daily as directed. Dx is E11.9 1 each 0   glipiZIDE (GLUCOTROL XL) 10 MG 24 hr tablet TAKE 1 TABLET BY MOUTH IN THE MORNING WITH FOOD FOR DIABETES 90 tablet 1   metoprolol tartrate (LOPRESSOR) 25 MG tablet TAKE 1 TABLET BY MOUTH TWICE A DAY 180 tablet 3   olmesartan (BENICAR) 20 MG tablet Take 1 tablet (20 mg total) by mouth daily. One tablet daily for blood pressure. 90 tablet 1   psyllium (METAMUCIL) 58.6 % powder Take 1 packet by mouth as needed.     sertraline (ZOLOFT) 50 MG tablet TAKE 1.5 TABLETS (75 MG TOTAL) BY MOUTH DAILY. FOR DEPRESSION. 135 tablet 0   tamsulosin (FLOMAX) 0.4 MG CAPS capsule TAKE 1 CAPSULE BY MOUTH ONCE DAILY WITH BREAKFAST FOR URINE FLOW 90 capsule 1   No current facility-administered medications on file prior to visit.    BP 126/62   Pulse 76   Temp (!) 97 F (36.1 C) (Temporal)   Ht '5\' 5"'  (1.651 m)   Wt 266 lb (120.7 kg)   SpO2 97%   BMI 44.26 kg/m  Objective:   Physical Exam HENT:     Right Ear: Tympanic membrane and ear canal normal.     Left Ear: Tympanic membrane and ear canal normal.     Nose: Nose normal.     Right Sinus: No maxillary sinus tenderness or frontal sinus tenderness.     Left Sinus: No maxillary sinus tenderness or frontal sinus tenderness.  Eyes:     Conjunctiva/sclera: Conjunctivae normal.  Neck:     Thyroid: No thyromegaly.     Vascular: No carotid bruit.  Cardiovascular:     Rate and Rhythm: Normal rate and regular rhythm.     Heart sounds: Normal heart sounds.  Pulmonary:     Effort: Pulmonary effort is normal.     Breath sounds: Normal breath sounds. No wheezing or rales.  Abdominal:      General: Bowel sounds are normal.     Palpations: Abdomen is soft.     Tenderness: There is no abdominal tenderness.  Musculoskeletal:        General: Normal  range of motion.     Cervical back: Neck supple.  Skin:    General: Skin is warm and dry.  Neurological:     Mental Status: He is alert and oriented to person, place, and time.     Cranial Nerves: No cranial nerve deficit.     Deep Tendon Reflexes: Reflexes are normal and symmetric.  Psychiatric:        Mood and Affect: Mood normal.          Assessment & Plan:      This visit occurred during the SARS-CoV-2 public health emergency.  Safety protocols were in place, including screening questions prior to the visit, additional usage of staff PPE, and extensive cleaning of exam room while observing appropriate contact time as indicated for disinfecting solutions.

## 2020-11-13 NOTE — Assessment & Plan Note (Signed)
Declines Shingrix, other vaccines UTD. PSA due and pending. Colonoscopy UTD, due in 2030.  Discussed the importance of a healthy diet and regular exercise in order for weight loss, and to reduce the risk of further co-morbidity.  Exam today stable. Labs pending.

## 2020-11-13 NOTE — Assessment & Plan Note (Signed)
Could be cause for chronic cough, seems to bother him more at night.  Discussed to trial omeprazole 20 mg nightly for GERD and cough. He will try and update.

## 2020-12-12 ENCOUNTER — Other Ambulatory Visit: Payer: Self-pay | Admitting: Primary Care

## 2020-12-13 NOTE — Telephone Encounter (Signed)
Up to date of visits and as next appointment. Will call in refills.

## 2021-01-04 ENCOUNTER — Other Ambulatory Visit: Payer: Self-pay | Admitting: Primary Care

## 2021-01-04 DIAGNOSIS — E119 Type 2 diabetes mellitus without complications: Secondary | ICD-10-CM

## 2021-01-08 ENCOUNTER — Other Ambulatory Visit: Payer: Self-pay | Admitting: Primary Care

## 2021-01-08 DIAGNOSIS — F320 Major depressive disorder, single episode, mild: Secondary | ICD-10-CM

## 2021-01-10 ENCOUNTER — Other Ambulatory Visit: Payer: Self-pay | Admitting: Primary Care

## 2021-01-10 DIAGNOSIS — E119 Type 2 diabetes mellitus without complications: Secondary | ICD-10-CM

## 2021-02-07 ENCOUNTER — Telehealth: Payer: Self-pay

## 2021-02-07 NOTE — Progress Notes (Addendum)
Chronic Care Management Pharmacy Assistant   Name: Eric Hill  MRN: [id number] DOB: April 19, 1951  Reason for Encounter: General Adherence   Recent office visits:  11/13/2020 - Alma Friendly, NP - Patient presented for Annual Wellness Visit. Start: omeprazole 20 mg nightly for GERD and cough. Change: Sertraline (ZOLOFT) 100 MG tablet and tamsulosin (FLOMAX) 0.4 MG CAPS capsule. Stop: Vitamin D, Ergocalciferol, (DRISDOL) 1.25 MG (50000 UNIT) CAPS capsule completed course. Labs: CMP, A1c, Vit D, Lipid, Microalbumin and PSA.   Recent consult visits:  None since last CCM contact  Hospital visits:  None in previous 6 months  Medications: Outpatient Encounter Medications as of 02/07/2021  Medication Sig   atorvastatin (LIPITOR) 20 MG tablet TAKE 1 TABLET BY MOUTH DAILY. FOR CHOLESTEROL   blood glucose meter kit and supplies KIT Dispense based on patient and insurance preference. Use up to 2-3 times daily as directed. Dx is E11.9   glipiZIDE (GLUCOTROL XL) 10 MG 24 hr tablet TAKE 1 TABLET BY MOUTH IN THE MORNING WITH FOOD FOR DIABETES   metoprolol tartrate (LOPRESSOR) 25 MG tablet TAKE 1 TABLET BY MOUTH TWICE A DAY   olmesartan (BENICAR) 20 MG tablet TAKE 1 TABLET BY MOUTH EVERY DAY FOR BLOOD PRESSURE   omeprazole (PRILOSEC) 20 MG capsule Take 20 mg by mouth daily.   psyllium (METAMUCIL) 58.6 % powder Take 1 packet by mouth as needed.   sertraline (ZOLOFT) 100 MG tablet Take 1 tablet (100 mg total) by mouth daily. For anxiety and depression   tamsulosin (FLOMAX) 0.4 MG CAPS capsule Take 2 capsules (0.8 mg total) by mouth daily. For urine flow.   Vitamin D, Ergocalciferol, (DRISDOL) 1.25 MG (50000 UNIT) CAPS capsule Take 1 capsule by mouth once weekly for vitamin D.   No facility-administered encounter medications on file as of 02/07/2021.   Contacted [name] on 02/11/2021 for general disease state and medication adherence call.   Patient is > 5 days past due for  refill on the following medications per chart history:  [facility] Medications: Medication Name/mg Last Fill Days Supply Olmesartan 20 mg       12/13/2020        90 Atorvastatin 20 mg  01/04/2021        90 Glipizide 10 mg   01/10/2021        90  What concerns do you have about your medications? Patient states he is now taking Sertraline (ZOLOFT) 100 MG tablet because it was changed. He feels like it doesn't work as well as before. He was having all good days before the dosage was switched and now he has had a few bad days. He had some old ones remaining so he took those and had good days on it. Per chart he was taking 1.5 tablets (65m) by mouth daily and it was changed to 1030mdaily on 11/13/2020.  The patient denies side effects with his medications. Patient states just a few bad days on the 10079mertraline. He doesn't feel like doing anything on the old ones.   How often do you forget or accidentally miss a dose? Never  Do you use a pillbox? No  Are you having any problems getting your medications from your pharmacy? No  Has the cost of your medications been a concern? No  Since last visit with CPP, the following interventions have been made: Increased: sertraline (ZOLOFT) 100 MG tablet. Start: Omeprazole 60m63mce night at bedtime.   The patient has not had an  ED visit since last contact.   The patient denies problems with their health.   he denies  concerns or questions for Debbora Dus, Pharm. D at this time.   Counseled patient on:  Great job taking medications  Care Gaps: Annual wellness visit in last year? Yes Most Recent BP reading: 126/62 on 11/13/2020  If Diabetic: Most recent A1C reading: 12.07 11/2020 Last eye exam / retinopathy screening: 12/07/2019  Last diabetic foot exam: 11/08/2019  No appointments scheduled within the next 30 days.  Attempted contact with patient on 10/25 and 10/26. Called to discuss the following with patient per Debbora Dus: is patient  aware he is on a higher dose of sertraline than before? Although he was taking 1 and 1/2 tablet (75 mg total), this single tablet (100 mg) is a higher dose and should be more effective. If he would like to go back down to 75 mg I can ask Belenda Cruise. Unsuccessful outreach. Time Spent: 10 Minutes  Debbora Dus, CPP notified  Marijean Niemann, [place] Clinical Pharmacy Assistant [phone]   I have reviewed the care management and care coordination activities outlined in this encounter and I am certifying that I agree with the content of this note. See addendum.  Debbora Dus, PharmD Clinical Pharmacist [facility] at [facility] [phone]

## 2021-02-12 NOTE — Telephone Encounter (Signed)
Is patient aware he is on a higher dose of sertraline than before? Although he was taking 1 and 1/2 tablet (75 mg total), this single tablet (100 mg) is a higher dose and should be more effective. If he would like to go back down to 75 mg I can ask Belenda Cruise.   Debbora Dus, PharmD Clinical Pharmacist [facility] at [facility] [phone]

## 2021-02-14 ENCOUNTER — Other Ambulatory Visit (INDEPENDENT_AMBULATORY_CARE_PROVIDER_SITE_OTHER): Payer: Medicare Other

## 2021-02-14 ENCOUNTER — Other Ambulatory Visit: Payer: Self-pay

## 2021-02-14 DIAGNOSIS — E559 Vitamin D deficiency, unspecified: Secondary | ICD-10-CM | POA: Diagnosis not present

## 2021-02-14 LAB — VITAMIN D 25 HYDROXY (VIT D DEFICIENCY, FRACTURES): VITD: 20.03 ng/mL — ABNORMAL LOW (ref 30.00–100.00)

## 2021-03-04 ENCOUNTER — Telehealth: Payer: Self-pay | Admitting: Primary Care

## 2021-03-04 NOTE — Telephone Encounter (Signed)
LVM for pt to rt my call to schedule AWV with [facility]. Please schedule AWV if pt calls the office.

## 2021-04-23 NOTE — Progress Notes (Signed)
Subjective:   Eric Hill is a 70 y.o. male who presents for Medicare Annual/Subsequent preventive examination.  I connected with Horatio Pel today by telephone and verified that I am speaking with the correct person using two identifiers. Location patient: home Location provider: work Persons participating in the virtual visit: patient, Marine scientist.    I discussed the limitations, risks, security and privacy concerns of performing an evaluation and management service by telephone and the availability of in person appointments. I also discussed with the patient that there may be a patient responsible charge related to this service. The patient expressed understanding and verbally consented to this telephonic visit.    Interactive audio and video telecommunications were attempted between this provider and patient, however failed, due to patient having technical difficulties OR patient did not have access to video capability.  We continued and completed visit with audio only.  Some vital signs may be absent or patient reported.   Time Spent with patient on telephone encounter: 20 minutes  Review of Systems     Cardiac Risk Factors include: diabetes mellitus;advanced age (>13mn, >>11women);hypertension     Objective:    Today's Vitals   04/25/21 1443  Weight: 266 lb (120.7 kg)  Height: '5\' 5"'  (1.651 m)   Body mass index is 44.26 kg/m.  Advanced Directives 04/25/2021  Does Patient Have a Medical Advance Directive? No  Would patient like information on creating a medical advance directive? Yes (MAU/Ambulatory/Procedural Areas - Information given)    Current Medications (verified) Outpatient Encounter Medications as of 04/25/2021  Medication Sig   atorvastatin (LIPITOR) 20 MG tablet TAKE 1 TABLET BY MOUTH DAILY. FOR CHOLESTEROL   blood glucose meter kit and supplies KIT Dispense based on patient and insurance preference. Use up to 2-3 times daily as directed. Dx is E11.9    glipiZIDE (GLUCOTROL XL) 10 MG 24 hr tablet TAKE 1 TABLET BY MOUTH IN THE MORNING WITH FOOD FOR DIABETES   metoprolol tartrate (LOPRESSOR) 25 MG tablet TAKE 1 TABLET BY MOUTH TWICE A DAY   olmesartan (BENICAR) 20 MG tablet TAKE 1 TABLET BY MOUTH EVERY DAY FOR BLOOD PRESSURE   omeprazole (PRILOSEC) 20 MG capsule Take 20 mg by mouth daily.   psyllium (METAMUCIL) 58.6 % powder Take 1 packet by mouth as needed.   sertraline (ZOLOFT) 100 MG tablet Take 1 tablet (100 mg total) by mouth daily. For anxiety and depression   tamsulosin (FLOMAX) 0.4 MG CAPS capsule Take 2 capsules (0.8 mg total) by mouth daily. For urine flow.   Vitamin D, Ergocalciferol, (DRISDOL) 1.25 MG (50000 UNIT) CAPS capsule Take 1 capsule by mouth once weekly for vitamin D.   No facility-administered encounter medications on file as of 04/25/2021.    Allergies (verified) Patient has no known allergies.   History: Past Medical History:  Diagnosis Date   Allergy    seasonal   Cataract    no surgery yet   Depression    Diverticulitis    Erectile dysfunction    History of depression    History of IBS    has abd pain   Hyperlipidemia    Hypertension    Type 2 diabetes mellitus (HCC)    Past Surgical History:  Procedure Laterality Date   COLONOSCOPY     NO PAST SURGERIES     Family History  Problem Relation Age of Onset   Neuropathy Mother    Cancer Sister        Back, brain  Cancer Maternal Grandfather        Abdominal    Stomach cancer Maternal Grandfather    Colon cancer Neg Hx    Esophageal cancer Neg Hx    Rectal cancer Neg Hx    Social History   Socioeconomic History   Marital status: Single    Spouse name: Not on file   Number of children: Not on file   Years of education: Not on file   Highest education level: Not on file  Occupational History   Not on file  Tobacco Use   Smoking status: Former    Types: Cigarettes    Quit date: 11/18/1990    Years since quitting: 30.4   Smokeless  tobacco: Never  Substance and Sexual Activity   Alcohol use: Not Currently    Comment: rarely   Drug use: No   Sexual activity: Not on file  Other Topics Concern   Not on file  Social History Narrative   Single   Works as a Mudlogger.   No children   Lives in [place]   Enjoys racing cars, Designer, fashion/clothing.   Social Determinants of Health   Financial Resource Strain: Low Risk    Difficulty of Paying Living Expenses: Not hard at all  Food Insecurity: No Food Insecurity   Worried About Charity fundraiser in the Last Year: Never true   [place] in the Last Year: Never true  Transportation Needs: No Transportation Needs   Lack of Transportation (Medical): No   Lack of Transportation (Non-Medical): No  Physical Activity: Inactive   Days of Exercise per Week: 0 days   Minutes of Exercise per Session: 0 min  Stress: No Stress Concern Present   Feeling of Stress : Not at all  Social Connections: Socially Isolated   Frequency of Communication with Friends and Family: Three times a week   Frequency of Social Gatherings with Friends and Family: Three times a week   Attends Religious Services: Never   Active Member of Clubs or Organizations: No   Attends Archivist Meetings: Never   Marital Status: Never married    Tobacco Counseling Counseling given: Not Answered   Clinical Intake:  Pre-visit preparation completed: Yes  Pain : No/denies pain     BMI - recorded: 44.26 Nutritional Status: BMI > 30  Obese Nutritional Risks: None Diabetes: Yes CBG done?: No Did pt. bring in CBG monitor from home?: No  How often do you need to have someone help you when you read instructions, pamphlets, or other written materials from your doctor or pharmacy?: 1 - Never  Diabetes:  Is the patient diabetic?  Yes  If diabetic, was a CBG obtained today?  No  Did the patient bring in their glucometer from home?  No  How often do you monitor your CBG's? daily.    Financial Strains and Diabetes Management:  Are you having any financial strains with the device, your supplies or your medication? No .  Does the patient want to be seen by Chronic Care Management for management of their diabetes?  No  Would the patient like to be referred to a Nutritionist or for Diabetic Management?  No   Diabetic Exams:  Diabetic Eye Exam: Overdue for diabetic eye exam.   Diabetic Foot Exam: Due, last completed 11/08/19.      Interpreter Needed?: No  Information entered by :: Orrin Brigham LPN   Activities of Daily Living In your present state  of health, do you have any difficulty performing the following activities: 04/25/2021 06/15/2020  Hearing? N N  Vision? N N  Difficulty concentrating or making decisions? N N  Walking or climbing stairs? N N  Dressing or bathing? N N  Doing errands, shopping? N N  Preparing Food and eating ? N -  Using the Toilet? N -  In the past six months, have you accidently leaked urine? N -  Do you have problems with loss of bowel control? N -  Managing your Medications? N -  Managing your Finances? N -  Housekeeping or managing your Housekeeping? N -  Some recent data might be hidden    Patient Care Team: Pleas [name], NP as PCP - General (Nurse Practitioner) Debbora Dus, [facility] as Pharmacist (Pharmacist)  Indicate any recent Medical Services you may have received from other than [facility] providers in the past year (date may be approximate).     Assessment:   This is a routine wellness examination for Osvaldo.  Hearing/Vision screen Hearing Screening - Comments:: No issues Vision Screening - Comments:: Last exam 2021,  Dr. Schuyler Amor   Dietary issues and exercise activities discussed: Current Exercise Habits: The patient does not participate in regular exercise at present   Goals Addressed             This Visit's Progress    Patient Stated       Would like to drink more water.       Depression  Screen PHQ 2/9 Scores 04/25/2021 11/13/2020 06/15/2020 11/02/2018 08/31/2014 08/10/2014  PHQ - 2 Score 0 2 0 0 0 1  PHQ- 9 Score - 5 0 - - -    Fall Risk Fall Risk  04/25/2021 11/13/2020  Falls in the past year? 0 0  Number falls in past yr: 0 0  Injury with Fall? 0 0  Risk for fall due to : No Fall Risks -  Follow up Falls prevention discussed -    FALL RISK PREVENTION PERTAINING TO THE HOME:  Any stairs in or around the home? Yes  If so, are there any without handrails? No  Home free of loose throw rugs in walkways, pet beds, electrical cords, etc? Yes  Adequate lighting in your home to reduce risk of falls? Yes   ASSISTIVE DEVICES UTILIZED TO PREVENT FALLS:  Life alert? No  Use of a cane, walker or w/c? No  Grab bars in the bathroom? Yes  Shower chair or bench in shower? No  Elevated toilet seat or a handicapped toilet? No   TIMED UP AND GO:  Was the test performed? No , visit completed over the phone.    Cognitive Function:    Normal cognitive status assessed by  this Nurse Health Advisor. No abnormalities found.      Immunizations Immunization History  Administered Date(s) Administered   Influenza,inj,Quad PF,6+ Mos 05/03/2018   Pneumococcal Conjugate-13 11/08/2019   Pneumococcal Polysaccharide-23 03/31/2018   Td 12/25/2015    TDAP status: Up to date  Flu Vaccine status: Declined, Education has been provided regarding the importance of this vaccine but patient still declined. Advised may receive this vaccine at local pharmacy or Health Dept. Aware to provide a copy of the vaccination record if obtained from local pharmacy or Health Dept. Verbalized acceptance and understanding.  Pneumococcal vaccine status: Up to date  Covid-19 vaccine status: Declined, Education has been provided regarding the importance of this vaccine but patient still declined. Advised may receive this vaccine  at local pharmacy or Health Dept.or vaccine clinic. Aware to provide a copy of the  vaccination record if obtained from local pharmacy or Health Dept. Verbalized acceptance and understanding.  Qualifies for Shingles Vaccine? Yes   Zostavax completed No   Shingrix Completed?: No.    Education has been provided regarding the importance of this vaccine. Patient has been advised to call insurance company to determine out of pocket expense if they have not yet received this vaccine. Advised may also receive vaccine at local pharmacy or Health Dept. Verbalized acceptance and understanding.  Screening Tests Health Maintenance  Topic Date Due   COVID-19 Vaccine (1) Never done   Zoster Vaccines- Shingrix (1 of 2) Never done   FOOT EXAM  11/07/2020   INFLUENZA VACCINE  11/26/2020   OPHTHALMOLOGY EXAM  12/06/2020   HEMOGLOBIN A1C  05/16/2021   TETANUS/TDAP  12/24/2025   COLONOSCOPY (Pts 45-76yr Insurance coverage will need to be confirmed)  12/01/2028   Pneumonia Vaccine 70 Years old  Completed   Hepatitis C Screening  Completed   HPV VACCINES  Aged Out    Health Maintenance  Health Maintenance Due  Topic Date Due   COVID-19 Vaccine (1) Never done   Zoster Vaccines- Shingrix (1 of 2) Never done   FOOT EXAM  11/07/2020   INFLUENZA VACCINE  11/26/2020   OPHTHALMOLOGY EXAM  12/06/2020    Colorectal cancer screening: Type of screening: Colonoscopy. Completed 12/02/18. Repeat every 10 years  Lung Cancer Screening: (Low Dose CT Chest recommended if Age 68-80years, 30 pack-year currently smoking OR have quit w/in 15years.) does not qualify.     Additional Screening:  Hepatitis C Screening: does qualify; Completed 09/02/17  Vision Screening: Recommended annual ophthalmology exams for early detection of glaucoma and other disorders of the eye. Is the patient up to date with their annual eye exam?  No  Who is the provider or what is the name of the office in which the patient attends annual eye exams? Dr. GSchuyler Amor  Dental Screening: Recommended annual dental exams for proper  oral hygiene  Community Resource Referral / Chronic Care Management: CRR required this visit?  No   CCM required this visit?  No      Plan:     I have personally reviewed and noted the following in the patients chart:   Medical and social history Use of alcohol, tobacco or illicit drugs  Current medications and supplements including opioid prescriptions. Patient is not currently taking opioid prescriptions. Functional ability and status Nutritional status Physical activity Advanced directives List of other physicians Hospitalizations, surgeries, and ER visits in previous 12 months Vitals Screenings to include cognitive, depression, and falls Referrals and appointments  In addition, I have reviewed and discussed with patient certain preventive protocols, quality metrics, and best practice recommendations. A written personalized care plan for preventive services as well as general preventive health recommendations were provided to patient.   Due to this being a telephonic visit, the after visit summary with patients personalized plan was offered to patient via mail or my-chart.  Patient preferred to pick up at office at next visit.    TLoma Messing LPN [id number]  Nurse Health Advisor  Nurse Notes: none

## 2021-04-25 ENCOUNTER — Other Ambulatory Visit: Payer: Self-pay | Admitting: Cardiovascular Disease

## 2021-04-25 ENCOUNTER — Ambulatory Visit (INDEPENDENT_AMBULATORY_CARE_PROVIDER_SITE_OTHER): Payer: Medicare Other

## 2021-04-25 VITALS — Ht 65.0 in | Wt 266.0 lb

## 2021-04-25 DIAGNOSIS — Z0000 Encounter for general adult medical examination without abnormal findings: Secondary | ICD-10-CM

## 2021-04-25 DIAGNOSIS — R002 Palpitations: Secondary | ICD-10-CM

## 2021-04-25 NOTE — Patient Instructions (Signed)
Eric Hill , Thank you for taking time to complete your Medicare Wellness Visit. I appreciate your ongoing commitment to your health goals. Please review the following plan we discussed and let me know if I can assist you in the future.   Screening recommendations/referrals: Colonoscopy: up to date, completed 12/02/18, due 12/01/28 Recommended yearly ophthalmology/optometry visit for glaucoma screening and checkup Recommended yearly dental visit for hygiene and checkup  Vaccinations: Influenza vaccine: Due-May obtain vaccine at our office or your local pharmacy Pneumococcal vaccine: up to date Tdap vaccine: up to date , completed 12/25/15, due 12/24/25 Shingles vaccine: May obtain vaccine at our your local pharmacy.   Covid-19: newest booster available at your local pharmacy  Advanced directives: information available at your next   Conditions/risks identified: see problem list  Next appointment: Follow up in one year for your annual wellness visit.   Preventive Care 47 Years and Older, Male Preventive care refers to lifestyle choices and visits with your health care provider that can promote health and wellness. What does preventive care include? A yearly physical exam. This is also called an annual well check. Dental exams once or twice a year. Routine eye exams. Ask your health care provider how often you should have your eyes checked. Personal lifestyle choices, including: Daily care of your teeth and gums. Regular physical activity. Eating a healthy diet. Avoiding tobacco and drug use. Limiting alcohol use. Practicing safe sex. Taking low doses of aspirin every day. Taking vitamin and mineral supplements as recommended by your health care provider. What happens during an annual well check? The services and screenings done by your health care provider during your annual well check will depend on your age, overall health, lifestyle risk factors, and family history of  disease. Counseling  Your health care provider may ask you questions about your: Alcohol use. Tobacco use. Drug use. Emotional well-being. Home and relationship well-being. Sexual activity. Eating habits. History of falls. Memory and ability to understand (cognition). Work and work Statistician. Screening  You may have the following tests or measurements: Height, weight, and BMI. Blood pressure. Lipid and cholesterol levels. These may be checked every 5 years, or more frequently if you are over 102 years old. Skin check. Lung cancer screening. You may have this screening every year starting at age 69 if you have a 30-pack-year history of smoking and currently smoke or have quit within the past 15 years. Fecal occult blood test (FOBT) of the stool. You may have this test every year starting at age 91. Flexible sigmoidoscopy or colonoscopy. You may have a sigmoidoscopy every 5 years or a colonoscopy every 10 years starting at age 19. Prostate cancer screening. Recommendations will vary depending on your family history and other risks. Hepatitis C blood test. Hepatitis B blood test. Sexually transmitted disease (STD) testing. Diabetes screening. This is done by checking your blood sugar (glucose) after you have not eaten for a while (fasting). You may have this done every 1-3 years. Abdominal aortic aneurysm (AAA) screening. You may need this if you are a current or former smoker. Osteoporosis. You may be screened starting at age 41 if you are at high risk. Talk with your health care provider about your test results, treatment options, and if necessary, the need for more tests. Vaccines  Your health care provider may recommend certain vaccines, such as: Influenza vaccine. This is recommended every year. Tetanus, diphtheria, and acellular pertussis (Tdap, Td) vaccine. You may need a Td booster every 10 years. Zoster  vaccine. You may need this after age 36. Pneumococcal 13-valent  conjugate (PCV13) vaccine. One dose is recommended after age 35. Pneumococcal polysaccharide (PPSV23) vaccine. One dose is recommended after age 23. Talk to your health care provider about which screenings and vaccines you need and how often you need them. This information is not intended to replace advice given to you by your health care provider. Make sure you discuss any questions you have with your health care provider. Document Released: 05/11/2015 Document Revised: 01/02/2016 Document Reviewed: 02/13/2015 Elsevier Interactive Patient Education  2017 [place] Prevention in the Home Falls can cause injuries. They can happen to people of all ages. There are many things you can do to make your home safe and to help prevent falls. What can I do on the outside of my home? Regularly fix the edges of walkways and driveways and fix any cracks. Remove anything that might make you trip as you walk through a door, such as a raised step or threshold. Trim any bushes or trees on the path to your home. Use bright outdoor lighting. Clear any walking paths of anything that might make someone trip, such as rocks or tools. Regularly check to see if handrails are loose or broken. Make sure that both sides of any steps have handrails. Any raised decks and porches should have guardrails on the edges. Have any leaves, snow, or ice cleared regularly. Use sand or salt on walking paths during winter. Clean up any spills in your garage right away. This includes oil or grease spills. What can I do in the bathroom? Use night lights. Install grab bars by the toilet and in the tub and shower. Do not use towel bars as grab bars. Use non-skid mats or decals in the tub or shower. If you need to sit down in the shower, use a plastic, non-slip stool. Keep the floor dry. Clean up any water that spills on the floor as soon as it happens. Remove soap buildup in the tub or shower regularly. Attach bath mats  securely with double-sided non-slip rug tape. Do not have throw rugs and other things on the floor that can make you trip. What can I do in the bedroom? Use night lights. Make sure that you have a light by your bed that is easy to reach. Do not use any sheets or blankets that are too big for your bed. They should not hang down onto the floor. Have a firm chair that has side arms. You can use this for support while you get dressed. Do not have throw rugs and other things on the floor that can make you trip. What can I do in the kitchen? Clean up any spills right away. Avoid walking on wet floors. Keep items that you use a lot in easy-to-reach places. If you need to reach something above you, use a strong step stool that has a grab bar. Keep electrical cords out of the way. Do not use floor polish or wax that makes floors slippery. If you must use wax, use non-skid floor wax. Do not have throw rugs and other things on the floor that can make you trip. What can I do with my stairs? Do not leave any items on the stairs. Make sure that there are handrails on both sides of the stairs and use them. Fix handrails that are broken or loose. Make sure that handrails are as long as the stairways. Check any carpeting to make sure that it  is firmly attached to the stairs. Fix any carpet that is loose or worn. Avoid having throw rugs at the top or bottom of the stairs. If you do have throw rugs, attach them to the floor with carpet tape. Make sure that you have a light switch at the top of the stairs and the bottom of the stairs. If you do not have them, ask someone to add them for you. What else can I do to help prevent falls? Wear shoes that: Do not have high heels. Have rubber bottoms. Are comfortable and fit you well. Are closed at the toe. Do not wear sandals. If you use a stepladder: Make sure that it is fully opened. Do not climb a closed stepladder. Make sure that both sides of the stepladder  are locked into place. Ask someone to hold it for you, if possible. Clearly mark and make sure that you can see: Any grab bars or handrails. First and last steps. Where the edge of each step is. Use tools that help you move around (mobility aids) if they are needed. These include: Canes. Walkers. Scooters. Crutches. Turn on the lights when you go into a dark area. Replace any light bulbs as soon as they burn out. Set up your furniture so you have a clear path. Avoid moving your furniture around. If any of your floors are uneven, fix them. If there are any pets around you, be aware of where they are. Review your medicines with your doctor. Some medicines can make you feel dizzy. This can increase your chance of falling. Ask your doctor what other things that you can do to help prevent falls. This information is not intended to replace advice given to you by your health care provider. Make sure you discuss any questions you have with your health care provider. Document Released: 02/08/2009 Document Revised: 09/20/2015 Document Reviewed: 05/19/2014 Elsevier Interactive Patient Education  2017 [employer].

## 2021-05-16 ENCOUNTER — Ambulatory Visit: Payer: Medicare Other | Admitting: Primary Care

## 2021-05-20 ENCOUNTER — Other Ambulatory Visit: Payer: Self-pay | Admitting: Primary Care

## 2021-05-20 ENCOUNTER — Other Ambulatory Visit: Payer: Self-pay | Admitting: Cardiovascular Disease

## 2021-05-20 DIAGNOSIS — E119 Type 2 diabetes mellitus without complications: Secondary | ICD-10-CM

## 2021-05-20 DIAGNOSIS — R002 Palpitations: Secondary | ICD-10-CM

## 2021-05-20 DIAGNOSIS — I10 Essential (primary) hypertension: Secondary | ICD-10-CM

## 2021-05-20 NOTE — Telephone Encounter (Signed)
Patient no showed recent diabetes follow up appointment. Can we get him rescheduled soon?

## 2021-05-22 NOTE — Telephone Encounter (Signed)
Called pt to get rescheduled. Lvmtcb to do so

## 2021-05-28 ENCOUNTER — Ambulatory Visit (INDEPENDENT_AMBULATORY_CARE_PROVIDER_SITE_OTHER): Payer: Medicare Other | Admitting: Primary Care

## 2021-05-28 ENCOUNTER — Other Ambulatory Visit: Payer: Self-pay

## 2021-05-28 ENCOUNTER — Encounter: Payer: Self-pay | Admitting: Primary Care

## 2021-05-28 VITALS — BP 128/64 | HR 64 | Temp 97.3°F | Ht 65.0 in | Wt 264.0 lb

## 2021-05-28 DIAGNOSIS — R053 Chronic cough: Secondary | ICD-10-CM | POA: Diagnosis not present

## 2021-05-28 DIAGNOSIS — E559 Vitamin D deficiency, unspecified: Secondary | ICD-10-CM | POA: Diagnosis not present

## 2021-05-28 DIAGNOSIS — E1165 Type 2 diabetes mellitus with hyperglycemia: Secondary | ICD-10-CM | POA: Diagnosis not present

## 2021-05-28 DIAGNOSIS — K219 Gastro-esophageal reflux disease without esophagitis: Secondary | ICD-10-CM

## 2021-05-28 DIAGNOSIS — Z23 Encounter for immunization: Secondary | ICD-10-CM | POA: Diagnosis not present

## 2021-05-28 LAB — POCT GLYCOSYLATED HEMOGLOBIN (HGB A1C): Hemoglobin A1C: 6.7 % — AB (ref 4.0–5.6)

## 2021-05-28 MED ORDER — CETIRIZINE HCL 10 MG PO TABS: 10.0000 mg | ORAL_TABLET | Freq: Every day | ORAL | 0 refills | Status: DC

## 2021-05-28 MED ORDER — VITAMIN D (ERGOCALCIFEROL) 1.25 MG (50000 UNIT) PO CAPS: ORAL_CAPSULE | ORAL | 1 refills | Status: DC

## 2021-05-28 MED ORDER — OMEPRAZOLE 20 MG PO CPDR: 20.0000 mg | DELAYED_RELEASE_CAPSULE | Freq: Every day | ORAL | 0 refills | Status: DC

## 2021-05-28 NOTE — Assessment & Plan Note (Signed)
Active and could be contributing to chronic cough.  Trial of omeprazole 20 mg nightly sent to pharmacy.  Asked patient to update me in 2 weeks.

## 2021-05-28 NOTE — Assessment & Plan Note (Signed)
Patient never refilled vitamin D capsules. Has been off for months.  Resume vitamin D 50,000 units once weekly. Will check vitamin D during CPE later this year.

## 2021-05-28 NOTE — Patient Instructions (Signed)
Resume vitamin D 50,000 unit capsules. Take 1 capsule by mouth once weekly.  Start Zyrtec 10 mg daily for cough and drainage.  Start omeprazole 20 mg at bedtime for cough.  Please call me with an update regarding your cough in 2-3 weeks.  It was a pleasure to see you today!

## 2021-05-28 NOTE — Progress Notes (Signed)
Subjective:    Patient ID: Eric Hill, male    DOB: 05/06/1950, 71 y.o.   MRN: [id number]  HPI  Eric Hill is a very pleasant 71 y.o. male with a history of type 2 diabetes, hypertension, erectile dysfunction, morbid obesity, MDD, chronic knee pain who presents today for follow up of diabetes. He would also like to discuss chronic cough and is requesting refills of his vitamin D.   1) Type 2 Diabetes:   Current medications include: Glipizide XL 10 mg  He is checking his blood glucose 2 times daily and is getting readings of:  AM fasting: 180's 2 hours after dinner: high 100's.   Last A1C: 7.3 in July 2022, 6.7 today  Last Eye Exam: Due, he will schedule.  Last Foot Exam: Due Pneumonia Vaccination: 2021 Urine Microalbumin: UTD Statin: atorvastatin   Dietary changes since last visit: He's been watching his diet at times. Has reduced intake of sugar. Increased intake of vegetables.    Exercise: Lifting weights at times    BP Readings from Last 3 Encounters:  05/28/21 128/64  11/13/20 126/62  06/15/20 132/64   2) Chronic Cough: Chronic for the last year, occurs each morning when waking, will cough intermittently for hours, will cough up green and grey sputum. He does not cough for the rest of the day. Worse at night when laying down.   He does have intermittent heartburn. He takes Mylanta at times.   He denies post nasal drip.   He smoked cigarettes during his younger years, quit 35 years ago. He lives in a mobile home, thinks there may be mold in the roof.   He is not taking anything OTC. He did do well on Mucinex in the past.   3) Vitamin D Deficiency: Chronic. Last level of 20 in October 2022. He endorses feeling much better when taking Vitamin D 50,000 units once weekly. He's not taken in months as he never had it refilled.    Review of Systems  Constitutional:  Negative for chills and fever.  HENT:  Positive for congestion. Negative for postnasal  drip and sore throat.   Respiratory:  Positive for cough. Negative for shortness of breath.   Cardiovascular:  Negative for chest pain.  Neurological:  Positive for numbness.       Numbness to feet at night, left worse than right        Past Medical History:  Diagnosis Date   Allergy    seasonal   Cataract    no surgery yet   Depression    Diverticulitis    Erectile dysfunction    History of depression    History of IBS    has abd pain   Hyperlipidemia    Hypertension    Type 2 diabetes mellitus ([place])     Social History   Socioeconomic History   Marital status: Single    Spouse name: Not on file   Number of children: Not on file   Years of education: Not on file   Highest education level: Not on file  Occupational History   Not on file  Tobacco Use   Smoking status: Former    Types: Cigarettes    Quit date: 11/18/1990    Years since quitting: 30.5   Smokeless tobacco: Never  Substance and Sexual Activity   Alcohol use: Not Currently    Comment: rarely   Drug use: No   Sexual activity: Not on file  Other Topics Concern  Not on file  Social History Narrative   Single   Works as a Mudlogger.   No children   Lives in [place]   Enjoys racing cars, Designer, fashion/clothing.   Social Determinants of Health   Financial Resource Strain: Low Risk    Difficulty of Paying Living Expenses: Not hard at all  Food Insecurity: No Food Insecurity   Worried About Charity fundraiser in the Last Year: Never true   [place] in the Last Year: Never true  Transportation Needs: No Transportation Needs   Lack of Transportation (Medical): No   Lack of Transportation (Non-Medical): No  Physical Activity: Inactive   Days of Exercise per Week: 0 days   Minutes of Exercise per Session: 0 min  Stress: No Stress Concern Present   Feeling of Stress : Not at all  Social Connections: Socially Isolated   Frequency of Communication with Friends and Family: Three times a  week   Frequency of Social Gatherings with Friends and Family: Three times a week   Attends Religious Services: Never   Active Member of Clubs or Organizations: No   Attends Archivist Meetings: Never   Marital Status: Never married  Human resources officer Violence: Not At Risk   Fear of Current or Ex-Partner: No   Emotionally Abused: No   Physically Abused: No   Sexually Abused: No    Past Surgical History:  Procedure Laterality Date   COLONOSCOPY     NO PAST SURGERIES      Family History  Problem Relation Age of Onset   Neuropathy Mother    Cancer Sister        Back, brain   Cancer Maternal Grandfather        Abdominal    Stomach cancer Maternal Grandfather    Colon cancer Neg Hx    Esophageal cancer Neg Hx    Rectal cancer Neg Hx     No Known Allergies  Current Outpatient Medications on File Prior to Visit  Medication Sig Dispense Refill   atorvastatin (LIPITOR) 20 MG tablet TAKE 1 TABLET BY MOUTH DAILY. FOR CHOLESTEROL 90 tablet 3   blood glucose meter kit and supplies KIT Dispense based on patient and insurance preference. Use up to 2-3 times daily as directed. Dx is E11.9 1 each 0   glipiZIDE (GLUCOTROL XL) 10 MG 24 hr tablet TAKE 1 TABLET BY MOUTH IN THE MORNING WITH FOOD FOR DIABETES 90 tablet 1   metoprolol tartrate (LOPRESSOR) 25 MG tablet Take 1 tablet (25 mg total) by mouth 2 (two) times daily. Schedule an appointment with [facility] for future refills. 1st attempt 120 tablet 0   olmesartan (BENICAR) 20 MG tablet TAKE 1 TABLET BY MOUTH EVERY DAY FOR BLOOD PRESSURE 90 tablet 1   psyllium (METAMUCIL) 58.6 % powder Take 1 packet by mouth as needed.     sertraline (ZOLOFT) 100 MG tablet Take 1 tablet (100 mg total) by mouth daily. For anxiety and depression 90 tablet 3   tamsulosin (FLOMAX) 0.4 MG CAPS capsule Take 2 capsules (0.8 mg total) by mouth daily. For urine flow. 180 capsule 2   omeprazole (PRILOSEC) 20 MG capsule Take 20 mg by mouth daily. (Patient not  taking: Reported on 05/28/2021)     Vitamin D, Ergocalciferol, (DRISDOL) 1.25 MG (50000 UNIT) CAPS capsule Take 1 capsule by mouth once weekly for vitamin D. (Patient not taking: Reported on 05/28/2021) 12 capsule 1   No current facility-administered medications  on file prior to visit.    BP 128/64    Pulse 64    Temp (!) 97.3 F (36.3 C) (Temporal)    Ht _0  (1.651 m)    Wt 264 lb (119.7 kg)    SpO2 96%    BMI 43.93 kg/m  Objective:   Physical Exam Cardiovascular:     Rate and Rhythm: Normal rate and regular rhythm.  Pulmonary:     Effort: Pulmonary effort is normal.     Breath sounds: Normal breath sounds. No wheezing or rales.  Musculoskeletal:     Cervical back: Neck supple.  Skin:    General: Skin is warm and dry.  Neurological:     Mental Status: He is alert and oriented to person, place, and time.          Assessment & Plan:      This visit occurred during the SARS-CoV-2 public health emergency.  Safety protocols were in place, including screening questions prior to the visit, additional usage of staff PPE, and extensive cleaning of exam room while observing appropriate contact time as indicated for disinfecting solutions.

## 2021-05-28 NOTE — Assessment & Plan Note (Signed)
Improved with A1C today of 6.7!  Continue Glipizide XL 10 mg daily. He will schedule an eye exam. Pneumonia vaccine UTD. Urine microalbumin UTD.  Discussed the importance of a healthy diet and regular exercise in order for weight loss, and to reduce the risk of further co-morbidity.  Follow up in 6 months.

## 2021-05-28 NOTE — Assessment & Plan Note (Signed)
Lungs clear. CT angio chest from 2022 reviewed.  Differentials could be COPD from prior smoking years, post nasal drip, GERD.  Start with Zyrtec 10 mg daily and omeprazole 20 mg nightly. I asked that he update me in 2 weeks.   Consider adding Mucinex or inhaler such as Spiriva

## 2021-06-21 ENCOUNTER — Other Ambulatory Visit: Payer: Self-pay | Admitting: Cardiovascular Disease

## 2021-06-21 DIAGNOSIS — R002 Palpitations: Secondary | ICD-10-CM

## 2021-06-27 ENCOUNTER — Telehealth: Payer: Self-pay

## 2021-06-27 NOTE — Progress Notes (Signed)
? ? ?  Chronic Care Management ?Pharmacy Assistant  ? ?Name: Eric Hill  MRN: [id number] DOB: 04-13-1951 ? ?Reason for Encounter: CCM (Appointment Cancellation) ?  ?Called patient for transfer to CCS program. No answer; left message. ? ?Charlene Brooke, CPP notified ? ?Marijean Niemann, RMA ?Clinical Pharmacy Assistant ?[phone] ? ?Time Spent: 5 Minutes ? ?

## 2021-07-01 ENCOUNTER — Telehealth: Payer: Medicare Other

## 2021-07-02 NOTE — Progress Notes (Signed)
Transition CCM to Self Care ? ?Attempted contact with patient . Unsuccessful outreach.  ? ?Charlene Brooke, CPP notified ? ?Marijean Niemann, RMA ?Clinical Pharmacy Assistant ?[phone] ? ? ? ?

## 2021-07-11 ENCOUNTER — Other Ambulatory Visit: Payer: Self-pay

## 2021-07-11 NOTE — Telephone Encounter (Signed)
Patient called stating he needs refill on Metoprolol 25 mg. Patient states he called yesterday and someone told him it was pending. It looks like original refill request went to cardiologist maybe? Who should patient get this refill from? Please let patient know. He has enough until Wednesday next week he thinks ?

## 2021-07-12 NOTE — Telephone Encounter (Signed)
Please kindly explain to patient that his refill request for metoprolol went to his cardiologist office, not ours.  We did not know that he needed a refill until he called. ? ?Is he still follow with cardiology?  If so then refill request will need to be completed by them, if not then we will fill. ?

## 2021-07-12 NOTE — Telephone Encounter (Signed)
Called patient states that he is not seeing Cardiology any more that the meds are working and he does not see any need in going there any more. Would like for you to call in refill.  ?

## 2021-07-14 MED ORDER — METOPROLOL TARTRATE 25 MG PO TABS: 25.0000 mg | ORAL_TABLET | Freq: Two times a day (BID) | ORAL | 1 refills | Status: DC

## 2021-08-24 ENCOUNTER — Other Ambulatory Visit: Payer: Self-pay | Admitting: Primary Care

## 2021-08-24 DIAGNOSIS — R053 Chronic cough: Secondary | ICD-10-CM

## 2021-08-24 DIAGNOSIS — K219 Gastro-esophageal reflux disease without esophagitis: Secondary | ICD-10-CM

## 2021-10-19 ENCOUNTER — Other Ambulatory Visit: Payer: Self-pay | Admitting: Primary Care

## 2021-10-19 DIAGNOSIS — R3915 Urgency of urination: Secondary | ICD-10-CM

## 2021-11-15 ENCOUNTER — Telehealth: Payer: Self-pay

## 2021-11-15 NOTE — Telephone Encounter (Signed)
[facility] - Client TELEPHONE ADVICE RECORD AccessNurse Patient Name: Eric Hill Gender: Male DOB: 01/28/1951 Age: 71 Y 81 M 27 D Return Phone Number: [phone] (Primary) Address: City/ State/ Zip: [place] [address] Client [facility] - Client Client Site [place] - Day Provider Alma Friendly - NP Contact Type Call Who Is Calling Patient / Member / Family / Caregiver Call Type Triage / Clinical Relationship To Patient Self Return Phone Number [phone] (Primary) Chief Complaint Heart palpitations or irregular heartbeat Reason for Call Symptomatic / Request for [name] he heart rate is low and the highest his heart rate has been is 36. Translation No Nurse Assessment Nurse: Primary school teacher, RN, Joelene Millin Date/Time (Eastern Time): 11/15/2021 3:38:23 PM Confirm and document reason for call. If symptomatic, describe symptoms. ---caller states pulse is low and noticed it last night. Currently driving, states he feels on his wrist and counting and currently in the 30's. Denies CP/ tightness, SOB, lightheaded for 2 days, no nausea Does the patient have any new or worsening symptoms? ---Yes Will a triage be completed? ---Yes Related visit to physician within the last 2 weeks? ---No Does the PT have any chronic conditions? (i.e. diabetes, asthma, this includes High risk factors for pregnancy, etc.) ---Yes List chronic conditions. ---Afib, Is this a behavioral health or substance abuse call? ---No Guidelines Guideline Title Affirmed Question Affirmed Notes Nurse Date/Time (Eastern Time) Heart Rate and Heartbeat Questions [1] Dizziness, lightheadedness, or weakness AND [2] heart beating very slowly (e.g., < 50 / minute) Schreffler, RN, Joelene Millin 11/15/2021 3:44:47 PM Disp. Time Eilene Ghazi Time) Disposition Final User 11/15/2021  3:58:34 PM Call EMS 911 Now Yes Schreffler, RN, Joelene Millin PLEASE NOTE: All timestamps contained within this report are represented as [place] Standard Time. CONFIDENTIALTY NOTICE: This fax transmission is intended only for the addressee. It contains information that is legally privileged, confidential or otherwise protected from use or disclosure. If you are not the intended recipient, you are strictly prohibited from reviewing, disclosing, copying using or disseminating any of this information or taking any action in reliance on or regarding this information. If you have received this fax in error, please notify us immediately by telephone so that we can arrange for its return to [place]. Phone: [phone], Toll-Free: [phone], Fax: [phone] Page: 2 of 2 Call Id: [id number] Final Disposition 11/15/2021 3:58:34 PM Call EMS 911 Now Yes Schreffler, RN, Renea Ee Disagree/Comply Disagree Caller Understands Yes PreDisposition Call Doctor Care Advice Given Per Guideline CALL EMS 911 NOW: * Immediate medical attention is needed. You need to hang up and call 911 (or an ambulance). * Triager Discretion: I'll call you back in a few minutes to be sure you were able to reach them. CARE ADVICE given per Heart Rate and Heartbeat Questions (Adult) guideline. Comments User: Lajean Silvius, RN Date/Time Eilene Ghazi Time): 11/15/2021 3:56:05 PM Caller refusing call 911 outcome as well as going to the ED NOW.states he just wants to talk with his PCP. back line called to PCP office while Pt. on hold and Caller disconnected while on hold with PCP office. User: Lajean Silvius, RN Date/Time Eilene Ghazi Time): 11/15/2021 3:58:23 PM Spoke with Mearl Latin over [facility] number and gave her report on Pt. situation and Pt. contact information and she stated she will call Pt. bac

## 2021-11-15 NOTE — Telephone Encounter (Signed)
Please see note below access nurse note. 

## 2021-11-15 NOTE — Telephone Encounter (Signed)
Kimber RN with access nurse called and said pt heart rate is 33 and lightheaded and hx of afib recommend 911 and pt refused.pt disconnected. I called pt and he said since few days ago picked up metoprolol tartrate 25 mg and pill is now pink and used to be white. I spoke with [facility] [address] and was advised same med and dosage but different brand. I advised pt; pt said he does not know if nerves or what is going on. Pt said off balance that comes and goes. Since about 45 mins ago pt started with skipping heart beat and P 33 and pt feels lightheaded. Early this morning BP 134/70 and P 65 - 70. Now pt does not know what BP is and I explained that the heart rate of 33 and pt feeling lightheaded and skipping beats is concerning enough to go to ED. Pt said he wants to know what Gentry Fitz NP thinks. Anda Kraft said pt needs to go to ED for eval. I advised pt he does not need to drive. Pt pulled over at [place]. Pt declined 911 and called a friend to come pick him up. Pt said will decide if feels worse he will go to ED. I asked pt to please with symptoms he has now needs to go to ED. Pt said he has been feeling a little depressed. Pt denied SI/HI.pt said that he has a CPX with Gentry Fitz NP on 11/19/21. I explained he should not wait until Tues to be seen and we will leave that appt as is but he needs to go to ED now. I stayed on phone with pt until his friend got there and pt finally said he would go to [facility] ED now. If condition changes or worsens on way to ED will pull over and call 911. Sending note to Gentry Fitz NP.When access nurse note available will attach to this note.

## 2021-11-15 NOTE — Telephone Encounter (Signed)
Patient called in stating that for the last couple of days he has noticed an increase in heat palpations, started 3 days ago and thought nothing of it but the next day it was more frequent and when he would check his pulse the sensor he was using would give him an error reading and then when it gave him his pulse it was 33. Did have patient speak with access.

## 2021-11-15 NOTE — Telephone Encounter (Signed)
Noted. Patient needs ED evaluation now.

## 2021-11-18 ENCOUNTER — Telehealth: Payer: Self-pay | Admitting: Cardiovascular Disease

## 2021-11-18 NOTE — Telephone Encounter (Signed)
Spoke to patient he stated he has noticed heart flutters and slow heart beat for the past 10 days.Stated heart rate at night 34,40.He is worried about slow heart rate at night.Advised to take 1/2 his Metoprolol dose until his appointment with Laurann Montana NP 7/26 at 10:30 am at [facility] location.

## 2021-11-18 NOTE — Telephone Encounter (Signed)
Looks like patient did not go to hospital or UC since message was sent. Patient has appt scheduled for 11/19/21 at 9:00 am.

## 2021-11-18 NOTE — Telephone Encounter (Signed)
STAT if HR is under 50 or over 120 (normal HR is 60-100 beats per minute)  What is your heart rate? 41  Do you have a log of your heart rate readings (document readings)? 66, 35  Do you have any other symptoms? Patient states he feels his heart flutter.

## 2021-11-19 ENCOUNTER — Ambulatory Visit (INDEPENDENT_AMBULATORY_CARE_PROVIDER_SITE_OTHER): Payer: Medicare Other | Admitting: Primary Care

## 2021-11-19 ENCOUNTER — Encounter: Payer: Self-pay | Admitting: Primary Care

## 2021-11-19 VITALS — BP 140/78 | HR 42 | Temp 97.5°F | Ht 64.5 in | Wt 258.0 lb

## 2021-11-19 DIAGNOSIS — K219 Gastro-esophageal reflux disease without esophagitis: Secondary | ICD-10-CM

## 2021-11-19 DIAGNOSIS — R3912 Poor urinary stream: Secondary | ICD-10-CM | POA: Diagnosis not present

## 2021-11-19 DIAGNOSIS — F320 Major depressive disorder, single episode, mild: Secondary | ICD-10-CM | POA: Diagnosis not present

## 2021-11-19 DIAGNOSIS — Z0001 Encounter for general adult medical examination with abnormal findings: Secondary | ICD-10-CM | POA: Diagnosis not present

## 2021-11-19 DIAGNOSIS — R8290 Unspecified abnormal findings in urine: Secondary | ICD-10-CM

## 2021-11-19 DIAGNOSIS — I10 Essential (primary) hypertension: Secondary | ICD-10-CM

## 2021-11-19 DIAGNOSIS — E559 Vitamin D deficiency, unspecified: Secondary | ICD-10-CM | POA: Diagnosis not present

## 2021-11-19 DIAGNOSIS — E1165 Type 2 diabetes mellitus with hyperglycemia: Secondary | ICD-10-CM

## 2021-11-19 DIAGNOSIS — Z125 Encounter for screening for malignant neoplasm of prostate: Secondary | ICD-10-CM

## 2021-11-19 HISTORY — DX: Unspecified abnormal findings in urine: R82.90

## 2021-11-19 LAB — POC URINALSYSI DIPSTICK (AUTOMATED)
Bilirubin, UA: NEGATIVE
Glucose, UA: NEGATIVE
Ketones, UA: NEGATIVE
Nitrite, UA: NEGATIVE
Protein, UA: NEGATIVE
Spec Grav, UA: 1.025 (ref 1.010–1.025)
Urobilinogen, UA: 0.2 E.U./dL
pH, UA: 5.5 (ref 5.0–8.0)

## 2021-11-19 LAB — LIPID PANEL
Cholesterol: 118 mg/dL (ref 0–200)
HDL: 42.2 mg/dL (ref >39.00)
LDL Cholesterol: 56 mg/dL (ref 0–99)
NonHDL: 75.82
Total CHOL/HDL Ratio: 3
Triglycerides: 100 mg/dL (ref 0.0–149.0)
VLDL: 20 mg/dL (ref 0.0–40.0)

## 2021-11-19 LAB — CBC
HCT: 43.2 % (ref 39.0–52.0)
Hemoglobin: 14.8 g/dL (ref 13.0–17.0)
MCHC: 34.3 g/dL (ref 30.0–36.0)
MCV: 88.1 fl (ref 78.0–100.0)
Platelets: 236 10*3/uL (ref 150.0–400.0)
RBC: 4.9 Mil/uL (ref 4.22–5.81)
RDW: 13.5 % (ref 11.5–15.5)
WBC: 8.8 10*3/uL (ref 4.0–10.5)

## 2021-11-19 LAB — COMPREHENSIVE METABOLIC PANEL
ALT: 14 U/L (ref 0–53)
AST: 15 U/L (ref 0–37)
Albumin: 4.1 g/dL (ref 3.5–5.2)
Alkaline Phosphatase: 71 U/L (ref 39–117)
BUN: 19 mg/dL (ref 6–23)
CO2: 24 mEq/L (ref 19–32)
Calcium: 9.2 mg/dL (ref 8.4–10.5)
Chloride: 104 mEq/L (ref 96–112)
Creatinine, Ser: 0.74 mg/dL (ref 0.40–1.50)
GFR: 93 mL/min (ref >60.00)
Glucose, Bld: 155 mg/dL — ABNORMAL HIGH (ref 70–99)
Potassium: 4.1 mEq/L (ref 3.5–5.1)
Sodium: 139 mEq/L (ref 135–145)
Total Bilirubin: 0.8 mg/dL (ref 0.2–1.2)
Total Protein: 7.1 g/dL (ref 6.0–8.3)

## 2021-11-19 LAB — VITAMIN D 25 HYDROXY (VIT D DEFICIENCY, FRACTURES): VITD: 18.32 ng/mL — ABNORMAL LOW (ref 30.00–100.00)

## 2021-11-19 LAB — PSA, MEDICARE: PSA: 0.53 ng/ml (ref 0.10–4.00)

## 2021-11-19 LAB — HEMOGLOBIN A1C: Hgb A1c MFr Bld: 6.9 % — ABNORMAL HIGH (ref 4.6–6.5)

## 2021-11-19 MED ORDER — VITAMIN D (ERGOCALCIFEROL) 1.25 MG (50000 UNIT) PO CAPS: ORAL_CAPSULE | ORAL | 0 refills | Status: DC

## 2021-11-19 NOTE — Assessment & Plan Note (Signed)
Recently out of vitamin D Rx.  Repeat vitamin D level pending. Continue vitamin D 50,000 IU weekly.

## 2021-11-19 NOTE — Assessment & Plan Note (Signed)
Immunizations UTD. Declines Shingrix vacccines at the pharmacy. PSA due and pending. Colonoscopy up-to-date, due 2030.  Discussed the importance of a healthy diet and regular exercise in order for weight loss, and to reduce the risk of further co-morbidity.  Exam stable. Labs pending.  Follow up in 1 year for repeat physical.

## 2021-11-19 NOTE — Assessment & Plan Note (Addendum)
Above goal today but historically controlled.  Continue metoprolol tartrate 12.5 mg BID, continue olmesartan 20 mg daily. Cardiology appointment scheduled for tomorrow.

## 2021-11-19 NOTE — Assessment & Plan Note (Signed)
Improved but recently has noticed weak urinary stream and some nocturia.  He never visited Urology as referred.   Continue Flomax 0.8 mg nightly.  Offered referral to Urology, he kindly declines but will update if he changes his mind.

## 2021-11-19 NOTE — Assessment & Plan Note (Addendum)
Repeat A1C pending.  Continue glipizide XL 10 mg daily. Foot exam today. Managed on statin. Managed on ARB.  Follow up in 3-6 months.

## 2021-11-19 NOTE — Assessment & Plan Note (Signed)
Controlled.   Continue omeprazole 20 mg daily PRN.

## 2021-11-19 NOTE — Patient Instructions (Addendum)
Stop by the lab prior to leaving today. I will notify you of your results once received.   Resume your vitamin D weekly capsules.  Please schedule a follow up visit for 6 months for a diabetes check.  It was a pleasure to see you today!  Preventive Care 56 Years and Older, Male Preventive care refers to lifestyle choices and visits with your health care provider that can promote health and wellness. Preventive care visits are also called wellness exams. What can I expect for my preventive care visit? Counseling During your preventive care visit, your health care provider may ask about your: Medical history, including: Past medical problems. Family medical history. History of falls. Current health, including: Emotional well-being. Home life and relationship well-being. Sexual activity. Memory and ability to understand (cognition). Lifestyle, including: Alcohol, nicotine or tobacco, and drug use. Access to firearms. Diet, exercise, and sleep habits. Work and work Statistician. Sunscreen use. Safety issues such as seatbelt and bike helmet use. Physical exam Your health care provider will check your: Height and weight. These may be used to calculate your BMI (body mass index). BMI is a measurement that tells if you are at a healthy weight. Waist circumference. This measures the distance around your waistline. This measurement also tells if you are at a healthy weight and may help predict your risk of certain diseases, such as type 2 diabetes and high blood pressure. Heart rate and blood pressure. Body temperature. Skin for abnormal spots. What immunizations do I need?  Vaccines are usually given at various ages, according to a schedule. Your health care provider will recommend vaccines for you based on your age, medical history, and lifestyle or other factors, such as travel or where you work. What tests do I need? Screening Your health care provider may recommend screening tests  for certain conditions. This may include: Lipid and cholesterol levels. Diabetes screening. This is done by checking your blood sugar (glucose) after you have not eaten for a while (fasting). Hepatitis C test. Hepatitis B test. HIV (human immunodeficiency virus) test. STI (sexually transmitted infection) testing, if you are at risk. Lung cancer screening. Colorectal cancer screening. Prostate cancer screening. Abdominal aortic aneurysm (AAA) screening. You may need this if you are a current or former smoker. Talk with your health care provider about your test results, treatment options, and if necessary, the need for more tests. Follow these instructions at home: Eating and drinking  Eat a diet that includes fresh fruits and vegetables, whole grains, lean protein, and low-fat dairy products. Limit your intake of foods with high amounts of sugar, saturated fats, and salt. Take vitamin and mineral supplements as recommended by your health care provider. Do not drink alcohol if your health care provider tells you not to drink. If you drink alcohol: Limit how much you have to 0-2 drinks a day. Know how much alcohol is in your drink. In the [place] one drink equals one 12 oz bottle of beer (355 mL), one 5 oz glass of wine (148 mL), or one 1 oz glass of hard liquor (44 mL). Lifestyle Brush your teeth every morning and night with fluoride toothpaste. Floss one time each day. Exercise for at least 30 minutes 5 or more days each week. Do not use any products that contain nicotine or tobacco. These products include cigarettes, chewing tobacco, and vaping devices, such as e-cigarettes. If you need help quitting, ask your health care provider. Do not use drugs. If you are sexually active, practice safe  sex. Use a condom or other form of protection to prevent STIs. Take aspirin only as told by your health care provider. Make sure that you understand how much to take and what form to take. Work with your  health care provider to find out whether it is safe and beneficial for you to take aspirin daily. Ask your health care provider if you need to take a cholesterol-lowering medicine (statin). Find healthy ways to manage stress, such as: Meditation, yoga, or listening to music. Journaling. Talking to a trusted person. Spending time with friends and family. Safety Always wear your seat belt while driving or riding in a vehicle. Do not drive: If you have been drinking alcohol. Do not ride with someone who has been drinking. When you are tired or distracted. While texting. If you have been using any mind-altering substances or drugs. Wear a helmet and other protective equipment during sports activities. If you have firearms in your house, make sure you follow all gun safety procedures. Minimize exposure to UV radiation to reduce your risk of skin cancer. What's next? Visit your health care provider once a year for an annual wellness visit. Ask your health care provider how often you should have your eyes and teeth checked. Stay up to date on all vaccines. This information is not intended to replace advice given to you by your health care provider. Make sure you discuss any questions you have with your health care provider. Document Revised: 10/10/2020 Document Reviewed: 10/10/2020 Elsevier Patient Education  [place].

## 2021-11-19 NOTE — Progress Notes (Signed)
Subjective:    Patient ID: Eric Hill, male    DOB: [birth year], 71 y.o.   MRN: [id number]  HPI  Eric Hill is a very pleasant 71 y.o. male who presents today for complete physical and follow up of chronic conditions.  He would also like to discuss foul-smelling urine.  Over the last week he's noticed foul smelling urine, urinary urgency with nocturia. He's compliant to Tamsulosin 0.8 mg daily for which has historically worked well until now. He has a portable urinal for which he uses at night, noticed a sediment to the bottom of the urine container the next morning.  He's recently been taking a cranberry/vinegar pill and has noticed near complete resolve in symptoms. He is not sexually active. Denies penile itching, penile discharge.   Immunizations: -Tetanus: 2017 -Influenza: Completed last season  -Covid-19: Has not completed -Shingles: Has not completed -Pneumonia: Prevnar 76 in 2021, Pneumovax 23 in 2019  Diet: [place].  Exercise: No regular exercise.  Eye exam: Completes annually  Dental exam: Completes semi-annually   Colonoscopy: Completed in 2020, due 2030  PSA: Due  BP Readings from Last 3 Encounters:  11/19/21 140/78  05/28/21 128/64  11/13/20 126/62        Review of Systems  Constitutional:  Negative for unexpected weight change.  HENT:  Negative for rhinorrhea.   Respiratory:  Negative for cough and shortness of breath.   Cardiovascular:  Negative for chest pain.  Gastrointestinal:  Negative for constipation and diarrhea.  Genitourinary:  Negative for difficulty urinating.       Foul smelling urine, nocturia  Musculoskeletal:  Negative for arthralgias and myalgias.  Skin:  Negative for rash.  Allergic/Immunologic: Negative for environmental allergies.  Neurological:  Negative for dizziness, numbness and headaches.         Past Medical History:  Diagnosis Date   Allergy    seasonal   Cataract    no surgery yet   COVID-19  virus infection 06/15/2020   Depression    Diverticulitis    Erectile dysfunction    History of depression    History of IBS    has abd pain   Hyperlipidemia    Hypertension    Type 2 diabetes mellitus (HCC)     Social History   Socioeconomic History   Marital status: Single    Spouse name: Not on file   Number of children: Not on file   Years of education: Not on file   Highest education level: Not on file  Occupational History   Not on file  Tobacco Use   Smoking status: Former    Types: Cigarettes    Quit date: 11/18/1990    Years since quitting: 31.0   Smokeless tobacco: Never  Substance and Sexual Activity   Alcohol use: Not Currently    Comment: rarely   Drug use: No   Sexual activity: Not on file  Other Topics Concern   Not on file  Social History Narrative   Single   Works as a Mudlogger.   No children   Lives in [place]   Enjoys racing cars, Designer, fashion/clothing.   Social Determinants of Health   Financial Resource Strain: Low Risk  (04/25/2021)   Overall Financial Resource Strain (CARDIA)    Difficulty of Paying Living Expenses: Not hard at all  Food Insecurity: No Food Insecurity (04/25/2021)   Hunger Vital Sign    Worried About Running Out of Food in the Last Year:  Never true    Ran Out of Food in the Last Year: Never true  Transportation Needs: No Transportation Needs (04/25/2021)   PRAPARE - Hydrologist (Medical): No    Lack of Transportation (Non-Medical): No  Physical Activity: Inactive (04/25/2021)   Exercise Vital Sign    Days of Exercise per Week: 0 days    Minutes of Exercise per Session: 0 min  Stress: No Stress Concern Present (04/25/2021)   [place]    Feeling of Stress : Not at all  Social Connections: Socially Isolated (04/25/2021)   Social Connection and Isolation Panel [NHANES]    Frequency of Communication with Friends and  Family: Three times a week    Frequency of Social Gatherings with Friends and Family: Three times a week    Attends Religious Services: Never    Active Member of Clubs or Organizations: No    Attends Archivist Meetings: Never    Marital Status: Never married  Intimate Partner Violence: Not At Risk (04/25/2021)   Humiliation, Afraid, Rape, and Kick questionnaire    Fear of Current or Ex-Partner: No    Emotionally Abused: No    Physically Abused: No    Sexually Abused: No    Past Surgical History:  Procedure Laterality Date   COLONOSCOPY     NO PAST SURGERIES      Family History  Problem Relation Age of Onset   Neuropathy Mother    Cancer Sister        Back, brain   Cancer Maternal Grandfather        Abdominal    Stomach cancer Maternal Grandfather    Colon cancer Neg Hx    Esophageal cancer Neg Hx    Rectal cancer Neg Hx     No Known Allergies  Current Outpatient Medications on File Prior to Visit  Medication Sig Dispense Refill   atorvastatin (LIPITOR) 20 MG tablet TAKE 1 TABLET BY MOUTH DAILY. FOR CHOLESTEROL 90 tablet 3   blood glucose meter kit and supplies KIT Dispense based on patient and insurance preference. Use up to 2-3 times daily as directed. Dx is E11.9 1 each 0   cetirizine (ZYRTEC) 10 MG tablet Take 1 tablet (10 mg total) by mouth daily. For cough and drainage. 90 tablet 0   cholecalciferol (VITAMIN D3) 25 MCG (1000 UNIT) tablet Take 1,000 Units by mouth daily.     glipiZIDE (GLUCOTROL XL) 10 MG 24 hr tablet TAKE 1 TABLET BY MOUTH IN THE MORNING WITH FOOD FOR DIABETES 90 tablet 1   metoprolol tartrate (LOPRESSOR) 25 MG tablet Take 1 tablet (25 mg total) by mouth 2 (two) times daily. For heart rate. 180 tablet 1   olmesartan (BENICAR) 20 MG tablet TAKE 1 TABLET BY MOUTH EVERY DAY FOR BLOOD PRESSURE 90 tablet 1   omeprazole (PRILOSEC) 20 MG capsule TAKE 1 CAPSULE (20 MG TOTAL) BY MOUTH DAILY. FOR COUGH AND HEARTBURN. 90 capsule 0   psyllium  (METAMUCIL) 58.6 % powder Take 1 packet by mouth as needed.     sertraline (ZOLOFT) 100 MG tablet Take 1 tablet (100 mg total) by mouth daily. For anxiety and depression 90 tablet 3   tamsulosin (FLOMAX) 0.4 MG CAPS capsule TAKE 2 CAPSULES BY MOUTH DAILY. FOR URINE FLOW 180 capsule 0   No current facility-administered medications on file prior to visit.    BP 140/78   Pulse (!) 42  Temp (!) 97.5 F (36.4 C) (Oral)   Ht 5' 4.5" (1.638 m)   Wt 258 lb (117 kg)   SpO2 97%   BMI 43.60 kg/m  Objective:   Physical Exam HENT:     Right Ear: Tympanic membrane and ear canal normal.     Left Ear: Tympanic membrane and ear canal normal.     Nose: Nose normal.     Right Sinus: No maxillary sinus tenderness or frontal sinus tenderness.     Left Sinus: No maxillary sinus tenderness or frontal sinus tenderness.  Eyes:     Conjunctiva/sclera: Conjunctivae normal.  Neck:     Thyroid: No thyromegaly.     Vascular: No carotid bruit.  Cardiovascular:     Rate and Rhythm: Normal rate and regular rhythm.     Heart sounds: Normal heart sounds.  Pulmonary:     Effort: Pulmonary effort is normal.     Breath sounds: Normal breath sounds. No wheezing or rales.  Abdominal:     General: Bowel sounds are normal.     Palpations: Abdomen is soft.     Tenderness: There is no abdominal tenderness.  Musculoskeletal:        General: Normal range of motion.     Cervical back: Neck supple.  Skin:    General: Skin is warm and dry.  Neurological:     Mental Status: He is alert and oriented to person, place, and time.     Cranial Nerves: No cranial nerve deficit.     Deep Tendon Reflexes: Reflexes are normal and symmetric.  Psychiatric:        Mood and Affect: Mood normal.           Assessment & Plan:   Problem List Items Addressed This Visit       Cardiovascular and Mediastinum   Essential hypertension    Above goal today but historically controlled.  Continue metoprolol tartrate 12.5 mg  BID, continue olmesartan 20 mg daily. Cardiology appointment scheduled for tomorrow.       Relevant Orders   Lipid panel   Comprehensive metabolic panel   CBC     Digestive   GERD (gastroesophageal reflux disease)    Controlled.   Continue omeprazole 20 mg daily PRN.        Endocrine   Type 2 diabetes mellitus with hyperglycemia (HCC)    Repeat A1C pending.  Continue glipizide XL 10 mg daily. Foot exam today. Managed on statin. Managed on ARB.  Follow up in 3-6 months.      Relevant Orders   Hemoglobin A1c     Other   Vitamin D deficiency    Recently out of vitamin D Rx.  Repeat vitamin D level pending. Continue vitamin D 50,000 IU weekly.      Relevant Medications   Vitamin D, Ergocalciferol, (DRISDOL) 1.25 MG (50000 UNIT) CAPS capsule   Other Relevant Orders   VITAMIN D 25 Hydroxy (Vit-D Deficiency, Fractures)   Encounter for annual general medical examination with abnormal findings in adult - Primary    Immunizations UTD. Declines Shingrix vacccines at the pharmacy. PSA due and pending. Colonoscopy up-to-date, due 2030.  Discussed the importance of a healthy diet and regular exercise in order for weight loss, and to reduce the risk of further co-morbidity.  Exam stable. Labs pending.  Follow up in 1 year for repeat physical.       Major depressive disorder    Controlled.  Continue sertraline 100 mg daily.  Weak urinary stream    Improved but recently has noticed weak urinary stream and some nocturia.  He never visited Urology as referred.   Continue Flomax 0.8 mg nightly.  Offered referral to Urology, he kindly declines but will update if he changes his mind.       Foul smelling urine    UA today with trace leuks, trace blood. Culture sent and pending.  PSA pending.      Relevant Orders   POCT Urinalysis Dipstick (Automated) (Completed)   Urine Culture   Other Visit Diagnoses     Screening for prostate cancer        Relevant Orders   PSA, Medicare          Pleas [name], NP

## 2021-11-19 NOTE — Assessment & Plan Note (Signed)
UA today with trace leuks, trace blood. Culture sent and pending.  PSA pending.

## 2021-11-19 NOTE — Progress Notes (Unsigned)
Cardiology Office Note:    Date:  11/20/2021   ID:  Eric Hill, DOB June 13, 1950, MRN [id number]  PCP:  Pleas [name], NP   [facility] Providers Cardiologist:  None     Referring MD: Pleas [name], NP   CC: Recent slow heart rate at night and palpitations    History of Present Illness:    Eric Hill is a 71 y.o. male with a hx of the following:   GERD HTN HLD T2DM Obesity Major depressive disorder Palpitations Chronic chest wall pain  He has been seen Dr. Claiborne Billings before for palpitations since 2019.  At his first visit in September 2019 a 2-week event monitor was arranged for him and revealed predominantly sinus rhythm with a slowest heart rate 47 bpm and fastest heart rate being sinus tach at 127 bpm.  There were very rare PACs less than 1% and PVCs at 1% noted.  Several episodes of ventricular trigeminy were noted without any couplets.  There were no episodes of ventricular tachycardia or atrial fibrillation.  There were no pauses noted. 2D echo on February 03, 2018 showed LVEF of 55 to 60%, trivial aortic regurg, trivial tricuspid regurg, all other findings normal.  He was being treated with metoprolol tartrate 25 mg daily PRN for palpitations.    At a follow-up visit 2 months later, palpitations had improved.  Dr. Claiborne Billings went over his monitor and echo results with him .  He was on metoprolol succinate 25 mg daily and lifestyle modifications were discussed.   He followed up with Dr. Claiborne Billings the next year in June 2021 via telemedicine.  Palpitations had stabilized at this time.  He was taking metoprolol to tartrate 25 mg in the morning and 12.5 mg in the evening and lisinopril 20 mg daily.  Blood pressure was elevated and at times it went up to 160/89.  Metoprolol was adjusted to 25 mg twice a day.  He was told to continue Lipitor for hyperlipidemia and it was discussed to do 5 days a week of 30 minutes of moderate intensity activity.   Today he  presents to the office for chief complaint of slow heart rate at night that occurred 4-5 days ago. Also notices palpitations at night and HR and palpitations are resolved the next day upon awakening. STOP-BANG score today is 7 and has never been tested for sleep apnea.  Heart rate dropped down to 34 on his pulse oximeter a few nights ago.  Also has been getting elevated blood pressure readings while using an wrist cuff.  Average reading in the morning before taking his blood pressure medication is 160/70 the highest reading 2 weeks ago of 660-600 systolic over 90.  Denies any chest pain, shortness of breath, syncope, presyncope, dizziness, orthopnea, PND, increased swelling, significant weight change, bleeding, or claudication. Does not own a home scale.  Admits to eating at restaurants and eating Brendolyn Patty.  Had an episode of drinking several beers about 6 weeks ago but says he has stopped drinking alcohol.  Denies smoking or any illicit drug use.  Does admit to depression, hx of anxiety that has improved with Zoloft, and recent relationship issues. Says his only support system includes God and a neighbor.  Offered him referral to therapy or counseling services, however he declines at this time. Denies suicidal ideation, thoughts of self-harm, or thoughts of hurting someone else.   Past Medical History:  Diagnosis Date   Allergy    seasonal  Cataract    no surgery yet   COVID-19 virus infection 06/15/2020   Depression    Diverticulitis    Erectile dysfunction    History of depression    History of IBS    has abd pain   Hyperlipidemia    Hypertension    Type 2 diabetes mellitus ([place])     Past Surgical History:  Procedure Laterality Date   COLONOSCOPY     NO PAST SURGERIES      Current Medications: Current Meds  Medication Sig   atorvastatin (LIPITOR) 20 MG tablet TAKE 1 TABLET BY MOUTH DAILY. FOR CHOLESTEROL   blood glucose meter kit and supplies KIT Dispense based on patient and  insurance preference. Use up to 2-3 times daily as directed. Dx is E11.9   cetirizine (ZYRTEC) 10 MG tablet Take 1 tablet (10 mg total) by mouth daily. For cough and drainage.   cholecalciferol (VITAMIN D3) 25 MCG (1000 UNIT) tablet Take 1,000 Units by mouth daily.   glipiZIDE (GLUCOTROL XL) 10 MG 24 hr tablet TAKE 1 TABLET BY MOUTH IN THE MORNING WITH FOOD FOR DIABETES   olmesartan (BENICAR) 20 MG tablet TAKE 1 TABLET BY MOUTH EVERY DAY FOR BLOOD PRESSURE   omeprazole (PRILOSEC) 20 MG capsule TAKE 1 CAPSULE (20 MG TOTAL) BY MOUTH DAILY. FOR COUGH AND HEARTBURN.   psyllium (METAMUCIL) 58.6 % powder Take 1 packet by mouth as needed.   sertraline (ZOLOFT) 100 MG tablet Take 1 tablet (100 mg total) by mouth daily. For anxiety and depression   tamsulosin (FLOMAX) 0.4 MG CAPS capsule TAKE 2 CAPSULES BY MOUTH DAILY. FOR URINE FLOW   Vitamin D, Ergocalciferol, (DRISDOL) 1.25 MG (50000 UNIT) CAPS capsule Take 1 capsule by mouth once weekly for vitamin D   [DISCONTINUED] metoprolol tartrate (LOPRESSOR) 25 MG tablet Take 1 tablet (25 mg total) by mouth 2 (two) times daily. For heart rate.     Allergies:   Patient has no known allergies.   Social History   Socioeconomic History   Marital status: Single    Spouse name: Not on file   Number of children: Not on file   Years of education: Not on file   Highest education level: Not on file  Occupational History   Not on file  Tobacco Use   Smoking status: Former    Types: Cigarettes    Quit date: 11/18/1990    Years since quitting: 31.0   Smokeless tobacco: Never  Substance and Sexual Activity   Alcohol use: Not Currently    Comment: rarely   Drug use: No   Sexual activity: Not on file  Other Topics Concern   Not on file  Social History Narrative   Single   Works as a Mudlogger.   No children   Lives in [place]   Enjoys racing cars, Designer, fashion/clothing.   Social Determinants of Health   Financial Resource Strain: Low Risk   (04/25/2021)   Overall Financial Resource Strain (CARDIA)    Difficulty of Paying Living Expenses: Not hard at all  Food Insecurity: No Food Insecurity (04/25/2021)   Hunger Vital Sign    Worried About Running Out of Food in the Last Year: Never true    Ran Out of Food in the Last Year: Never true  Transportation Needs: No Transportation Needs (04/25/2021)   PRAPARE - Hydrologist (Medical): No    Lack of Transportation (Non-Medical): No  Physical Activity: Inactive (04/25/2021)  Exercise Vital Sign    Days of Exercise per Week: 0 days    Minutes of Exercise per Session: 0 min  Stress: No Stress Concern Present (04/25/2021)   [place]    Feeling of Stress : Not at all  Social Connections: Socially Isolated (04/25/2021)   Social Connection and Isolation Panel [NHANES]    Frequency of Communication with Friends and Family: Three times a week    Frequency of Social Gatherings with Friends and Family: Three times a week    Attends Religious Services: Never    Active Member of Clubs or Organizations: No    Attends Archivist Meetings: Never    Marital Status: Never married     Family History: The patient's family history includes Cancer in his maternal grandfather and sister; Neuropathy in his mother; Stomach cancer in his maternal grandfather. There is no history of Colon cancer, Esophageal cancer, or Rectal cancer.  ROS:   Review of Systems  Constitutional: Negative.   HENT: Negative.    Eyes: Negative.   Respiratory:  Negative for cough, hemoptysis, sputum production, shortness of breath and wheezing.   Cardiovascular:  Positive for palpitations and leg swelling. Negative for chest pain, orthopnea, claudication and PND.       Occasional ankle swelling after being on feet during the day that goes down with elevation of legs.   Gastrointestinal: Negative.   Genitourinary:  Negative.   Musculoskeletal: Negative.   Skin: Negative.   Neurological:  Negative for dizziness, tingling, tremors, sensory change, speech change, focal weakness, seizures, loss of consciousness, weakness and headaches.  Endo/Heme/Allergies: Negative.   Psychiatric/Behavioral:  Positive for depression and substance abuse. Negative for hallucinations, memory loss and suicidal ideas. The patient is nervous/anxious. The patient does not have insomnia.        Hx of substance abuse (alcohol) 6 weeks ago - drank about 6 beers, but states he has quit alcohol intake all together.    Please see the history of present illness.    All other systems reviewed and are negative.  EKGs/Labs/Other Studies Reviewed:    The following studies were reviewed today:   EKG:  EKG is ordered today.  The ekg ordered today demonstrates Normal sinus rhythm, 72 bpm, without PAC's or PVC's, without any acute changes.    Heart monitor on February 03, 2018: This was worn for approximately 2 weeks.  The predominant rhythm was sinus rhythm with the slowest heart rate 47 bpm and fastest heart rate being sinus tach at 127 bpm there were very rare PACs less than 1% PVCs and 1%.  There were several episodes of ventricular trigeminy.  There were no couplets.  There were no episodes of ventricular tachycardia or A-fib. There were no pauses.   2D echo on February 03, 2018: Left ventricle: The cavity size is normal.  Systolic function was normal.  The estimated ejection fraction was in the range of 55% to 60%.  Wall motion was normal; there were no regional wall motion abnormalities.  The study is not technically sufficient to allow evaluation of LV diastolic function. 2. Aortic valve: Transvalvular velocity was within the normal range.  There is no stenosis.  There is trivial regurgitation. 3. Mitral valve: Transvalvular velocity was within normal range.  There is no evidence for stenosis.  There was no regurgitation. 4. Right  ventricle the cavity size was normal.  Wall thickness was normal.  Systolic function was normal. 5.  Atrial septum: No defect or patent foramen ovale was identified by color-flow Doppler. 6.  Tricuspid valve: There was trivial regurgitation. 7.  Pulmonary arteries: Systolic pressure was within the normal range.  PA peak pressure: 25 mmHg.     Recent Labs: 11/19/2021: ALT 14; BUN 19; Creatinine, Ser 0.74; Hemoglobin 14.8; Platelets 236.0; Potassium 4.1; Sodium 139  Recent Lipid Panel    Component Value Date/Time   CHOL 118 11/19/2021 0958   TRIG 100.0 11/19/2021 0958   HDL 42.20 11/19/2021 0958   CHOLHDL 3 11/19/2021 0958   VLDL 20.0 11/19/2021 0958   LDLCALC 56 11/19/2021 0958   LDLDIRECT 100.0 11/03/2019 0933     Risk Assessment/Calculations:      STOP-Bang Score:  7       Physical Exam:    VS:  BP 112/72 (BP Location: Right Arm, Patient Position: Sitting, Cuff Size: Large)   Pulse 72   Ht 5' 4.5" (1.638 m)   Wt 263 lb 3.2 oz (119.4 kg)   BMI 44.48 kg/m     Wt Readings from Last 3 Encounters:  11/20/21 263 lb 3.2 oz (119.4 kg)  11/19/21 258 lb (117 kg)  05/28/21 264 lb (119.7 kg)     GEN: Well nourished, obese 71 y.o. Caucasian male in no acute distress HEENT: Normal NECK: No JVD; No carotid bruits CARDIAC: RRR, no murmurs, rubs, gallops; 2+ pulses throughout, strong and equal bilaterally RESPIRATORY:  Clear to auscultation without rales, wheezing or rhonchi  ABDOMEN: Soft, non-tender, distended, bowel sounds X 4 MUSCULOSKELETAL: Trace, nonpitting edema along bilateral ankles, otherwise no edema; No deformity  SKIN: Warm and dry NEUROLOGIC:  Alert and oriented x 3 PSYCHIATRIC:  Calm and pleasant, but appears sad and depressed at times  ASSESSMENT:    1. Hypertension, unspecified type   2. Palpitations   3. Hyperlipidemia, unspecified hyperlipidemia type   4. Snoring    PLAN:    In order of problems listed above:  Palpitations / slow heart rate- acute,  stable and improving Predominantly bradycardic at night. Consider etiology sleep apnea, set up for sleep study. Will arrange 14 day ZIO and obtain a TSH and Magnesium level today. Slow heart rate has improved since reducing Metoprolol to 12.5 mg BID. No near syncope or presyncope. Continue current medication regimen. If he becomes symptomatic with bradycardia, plan to d/c Metoprolol. If his symptoms worsen or change, educated him to seek ED care immediately.   2. Snoring - chronic, stable STOP-BANG score 7 today.  We will set him up for sleep studies today and he is agreeable.  3. Hypertension - chronic, improved  BP stable today in office. Discussed that he needs to obtain an Omron cuff. Discussed to monitor BP at home at least 2 hours after medications and sitting for 5-10 minutes.  Told him to monitor this for 2 weeks and to let me know via MyChart which we will get him set up with today.  Continue current antihypertensive medications.   4. Class 3 Obesity - chronic, not progressing Weight loss via diet and exercise encouraged. Discussed the impact being overweight would have on cardiovascular risk. Heart healthy diet and regular cardiovascular exercise encouraged.  Congratulated him on quitting drinking.  Also discussed the risks of excessive alcohol use can have on his health and heart.  We will continue to monitor.   5. Major Depressive Disorder - chronic, stable Appears depressed on exam. States depression started when girlfriend died from cancer 6 years ago. Also tells me  about recent relationship issues and feeling lonely. Denies SI or thoughts of harming self or others. Offered referral to counseling services and therapy, but declines this for now. Continue to follow with PCP.  6. Disposition - F/U in 4-6 weeks or sooner if anything changes.      Medication Adjustments/Labs and Tests Ordered: Current medicines are reviewed at length with the patient today.  Concerns regarding medicines  are outlined above.  Orders Placed This Encounter  Procedures   Magnesium   TSH   LONG TERM MONITOR (3-14 DAYS)   EKG 12-Lead   Home sleep test   Meds ordered this encounter  Medications   metoprolol tartrate (LOPRESSOR) 25 MG tablet    Sig: Take 0.5 tablets (12.5 mg total) by mouth 2 (two) times daily. For heart rate.    Dispense:  90 tablet    Refill:  3    Patient Instructions  Medication Instructions:  Your physician has recommended you make the following change in your medication:  Change: Metoprolol 12.68m twice daily   *If you need a refill on your cardiac medications before your next appointment, please call your pharmacy*   Lab Work: Your physician recommends that you return for lab work today: TSH, Mag   If you have labs (blood work) drawn today and your tests are completely normal, you will receive your results only by: [facility](if you have MyChart) OR A paper copy in the mail If you have any lab test that is abnormal or we need to change your treatment, we will call you to review the results.   Testing/Procedures: Your physician has recommended that you wear a Zio monitor.   This monitor is a medical device that records the heart's electrical activity. Doctors most often use these monitors to diagnose arrhythmias. Arrhythmias are problems with the speed or rhythm of the heartbeat. The monitor is a small device applied to your chest. You can wear one while you do your normal daily activities. While wearing this monitor if you have any symptoms to push the button and record what you felt. Once you have worn this monitor for the period of time provider prescribed (Usually 14 days), you will return the monitor device in the postage paid box. Once it is returned they will download the data collected and provide [facility] a report which the provider will then review and we will call you with those results. Important tips:  Avoid showering during the first 24 hours  of wearing the monitor. Avoid excessive sweating to help maximize wear time. Do not submerge the device, no hot tubs, and no swimming pools. Keep any lotions or oils away from the patch. After 24 hours you may shower with the patch on. Take brief showers with your back facing the shower head.  Do not remove patch once it has been placed because that will interrupt data and decrease adhesive wear time. Push the button when you have any symptoms and write down what you were feeling. Once you have completed wearing your monitor, remove and place into box which has postage paid and place in your outgoing mailbox.  If for some reason you have misplaced your box then call our office and we can provide another box and/or mail it off for you.  Your physician has recommended that you have a sleep study. This test records several body functions during sleep, including: brain activity, eye movement, oxygen and carbon dioxide blood levels, heart rate and rhythm, breathing rate and  rhythm, the flow of air through your mouth and nose, snoring, body muscle movements, and chest and belly movement.   Follow-Up: At [facility], you and your health needs are our priority.  As part of our continuing mission to provide you with exceptional heart care, we have created designated Provider Care Teams.  These Care Teams include your primary Cardiologist (physician) and Advanced Practice Providers (APPs -  Physician Assistants and Nurse Practitioners) who all work together to provide you with the care you need, when you need it.  We recommend signing up for the patient portal called "MyChart".  Sign up information is provided on this After Visit Summary.  MyChart is used to connect with patients for Virtual Visits (Telemedicine).  Patients are able to view lab/test results, encounter notes, upcoming appointments, etc.  Non-urgent messages can be sent to your provider as well.   To learn more about what you can do with  MyChart, go to [email or web address].    Your next appointment:   4-6 week(s)  The format for your next appointment:   In Person  Provider: Claiborne Billings or APP {  Other Instructions  Tips to Measure your Blood Pressure Correctly  Please check your blood pressure 1x per day 1-2 hours after taking medications and resting 5-10 minutes. Please keep a log and bring to follow up appointments.   To determine whether you have hypertension, a medical professional will take a blood pressure reading. How you prepare for the test, the position of your arm, and other factors can change a blood pressure reading by 10% or more. That could be enough to hide high blood pressure, start you on a drug you don't really need, or lead your doctor to incorrectly adjust your medications.  National and international guidelines offer specific instructions for measuring blood pressure. If a doctor, nurse, or medical assistant isn't doing it right, don't hesitate to ask him or her to get with the guidelines.  Here's what you can do to ensure a correct reading:  Don't drink a caffeinated beverage or smoke during the 30 minutes before the test.  Sit quietly for five minutes before the test begins.  During the measurement, sit in a chair with your feet on the floor and your arm supported so your elbow is at about heart level.  The inflatable part of the cuff should completely cover at least 80% of your upper arm, and the cuff should be placed on bare skin, not over a shirt.  Don't talk during the measurement.  Have your blood pressure measured twice, with a brief break in between. If the readings are different by 5 points or more, have it done a third time.  In 2017, new guidelines from the [place], the [employer] of Cardiology, and nine other health organizations lowered the diagnosis of high blood pressure to 130/80 mm Hg or higher for all adults. The guidelines also redefined the various blood  pressure categories to now include normal, elevated, Stage 1 hypertension, Stage 2 hypertension, and hypertensive crisis (see "Blood pressure categories").  Blood pressure categories  Blood pressure category SYSTOLIC (upper number)  DIASTOLIC (lower number)  Normal Less than 120 mm Hg and Less than 80 mm Hg  Elevated 120-129 mm Hg and Less than 80 mm Hg  High blood pressure: Stage 1 hypertension 130-139 mm Hg or 80-89 mm Hg  High blood pressure: Stage 2 hypertension 140 mm Hg or higher or 90 mm Hg or higher  Hypertensive crisis (consult  your doctor immediately) Higher than 180 mm Hg and/or Higher than 120 mm Hg  Source: American Heart Association and American Stroke Association. For more on getting your blood pressure under control, buy Controlling Your Blood Pressure, a Special Health Report from [facility].   Blood Pressure Log   Date   Time  Blood Pressure  Position  Example: Nov 1 9 AM 124/78 sitting                                                   DASH Eating Plan DASH stands for Dietary Approaches to Stop Hypertension. The DASH eating plan is a healthy eating plan that has been shown to: Reduce high blood pressure (hypertension). Reduce your risk for type 2 diabetes, heart disease, and stroke. Help with weight loss. What are tips for following this plan? Reading food labels Check food labels for the amount of salt (sodium) per serving. Choose foods with less than 5 percent of the Daily Value of sodium. Generally, foods with less than 300 milligrams (mg) of sodium per serving fit into this eating plan. To find whole grains, look for the word "whole" as the first word in the ingredient list. Shopping Buy products labeled as "low-sodium" or "no salt added." Buy fresh foods. Avoid canned foods and pre-made or frozen meals. Cooking Avoid adding salt when cooking. Use salt-free seasonings or herbs instead of table salt or sea salt. Check with your  health care provider or pharmacist before using salt substitutes. Do not fry foods. Cook foods using healthy methods such as baking, boiling, grilling, roasting, and broiling instead. Cook with heart-healthy oils, such as olive, canola, avocado, soybean, or sunflower oil. Meal planning  Eat a balanced diet that includes: 4 or more servings of fruits and 4 or more servings of vegetables each day. Try to fill one-half of your plate with fruits and vegetables. 6-8 servings of whole grains each day. Less than 6 oz (170 g) of lean meat, poultry, or fish each day. A 3-oz (85-g) serving of meat is about the same size as a deck of cards. One egg equals 1 oz (28 g). 2-3 servings of low-fat dairy each day. One serving is 1 cup (237 mL). 1 serving of nuts, seeds, or beans 5 times each week. 2-3 servings of heart-healthy fats. Healthy fats called omega-3 fatty acids are found in foods such as walnuts, flaxseeds, fortified milks, and eggs. These fats are also found in cold-water fish, such as sardines, salmon, and mackerel. Limit how much you eat of: Canned or prepackaged foods. Food that is high in trans fat, such as some fried foods. Food that is high in saturated fat, such as fatty meat. Desserts and other sweets, sugary drinks, and other foods with added sugar. Full-fat dairy products. Do not salt foods before eating. Do not eat more than 4 egg yolks a week. Try to eat at least 2 vegetarian meals a week. Eat more home-cooked food and less restaurant, buffet, and fast food. Lifestyle When eating at a restaurant, ask that your food be prepared with less salt or no salt, if possible. If you drink alcohol: Limit how much you use to: 0-1 drink a day for women who are not pregnant. 0-2 drinks a day for men. Be aware of how much alcohol is in your drink. In the [place]  one drink equals one 12 oz bottle of beer (355 mL), one 5 oz glass of wine (148 mL), or one 1 oz glass of hard liquor (44 mL). General  information Avoid eating more than 2,300 mg of salt a day. If you have hypertension, you may need to reduce your sodium intake to 1,500 mg a day. Work with your health care provider to maintain a healthy body weight or to lose weight. Ask what an ideal weight is for you. Get at least 30 minutes of exercise that causes your heart to beat faster (aerobic exercise) most days of the week. Activities may include walking, swimming, or biking. Work with your health care provider or dietitian to adjust your eating plan to your individual calorie needs. What foods should I eat? Fruits All fresh, dried, or frozen fruit. Canned fruit in natural juice (without added sugar). Vegetables Fresh or frozen vegetables (raw, steamed, roasted, or grilled). Low-sodium or reduced-sodium tomato and vegetable juice. Low-sodium or reduced-sodium tomato sauce and tomato paste. Low-sodium or reduced-sodium canned vegetables. Grains Whole-grain or whole-wheat bread. Whole-grain or whole-wheat pasta. Brown rice. [place]. Bulgur. Whole-grain and low-sodium cereals. Pita bread. Low-fat, low-sodium crackers. Whole-wheat flour tortillas. Meats and other proteins Skinless chicken or [place]. Ground chicken or [place]. Pork with fat trimmed off. Fish and seafood. Egg whites. Dried beans, peas, or lentils. Unsalted nuts, nut butters, and seeds. Unsalted canned beans. Lean cuts of beef with fat trimmed off. Low-sodium, lean precooked or cured meat, such as sausages or meat loaves. Dairy Low-fat (1%) or fat-free (skim) milk. Reduced-fat, low-fat, or fat-free cheeses. Nonfat, low-sodium ricotta or cottage cheese. Low-fat or nonfat yogurt. Low-fat, low-sodium cheese. Fats and oils Soft margarine without trans fats. Vegetable oil. Reduced-fat, low-fat, or light mayonnaise and salad dressings (reduced-sodium). Canola, safflower, olive, avocado, soybean, and sunflower oils. Avocado. Seasonings and condiments Herbs. Spices. Seasoning  mixes without salt. Other foods Unsalted popcorn and pretzels. Fat-free sweets. The items listed above may not be a complete list of foods and beverages you can eat. Contact a dietitian for more information. What foods should I avoid? Fruits Canned fruit in a light or heavy syrup. Fried fruit. Fruit in cream or butter sauce. Vegetables Creamed or fried vegetables. Vegetables in a cheese sauce. Regular canned vegetables (not low-sodium or reduced-sodium). Regular canned tomato sauce and paste (not low-sodium or reduced-sodium). Regular tomato and vegetable juice (not low-sodium or reduced-sodium). Angie Fava. Olives. Grains Baked goods made with fat, such as croissants, muffins, or some breads. Dry pasta or rice meal packs. Meats and other proteins Fatty cuts of meat. Ribs. Fried meat. Berniece Salines. Bologna, salami, and other precooked or cured meats, such as sausages or meat loaves. Fat from the back of a pig (fatback). Bratwurst. Salted nuts and seeds. Canned beans with added salt. Canned or smoked fish. Whole eggs or egg yolks. Chicken or [place] with skin. Dairy Whole or 2% milk, cream, and half-and-half. Whole or full-fat cream cheese. Whole-fat or sweetened yogurt. Full-fat cheese. Nondairy creamers. Whipped toppings. Processed cheese and cheese spreads. Fats and oils Butter. Stick margarine. Lard. Shortening. Ghee. Bacon fat. Tropical oils, such as coconut, palm kernel, or palm oil. Seasonings and condiments Onion salt, garlic salt, seasoned salt, table salt, and sea salt. Worcestershire sauce. Tartar sauce. Barbecue sauce. Teriyaki sauce. Soy sauce, including reduced-sodium. Steak sauce. Canned and packaged gravies. Fish sauce. Oyster sauce. Cocktail sauce. Store-bought horseradish. Ketchup. Mustard. Meat flavorings and tenderizers. Bouillon cubes. Hot sauces. Pre-made or packaged marinades. Pre-made or packaged taco seasonings.  Relishes. Regular salad dressings. Other foods Salted popcorn and  pretzels. The items listed above may not be a complete list of foods and beverages you should avoid. Contact a dietitian for more information. Where to find more information National Heart, Lung, and Blood Institute: https://wilson-eaton.com/ American Heart Association: www.heart.org Academy of Nutrition and Dietetics: www.eatright.[place]: www.kidney.org Summary The DASH eating plan is a healthy eating plan that has been shown to reduce high blood pressure (hypertension). It may also reduce your risk for type 2 diabetes, heart disease, and stroke. When on the DASH eating plan, aim to eat more fresh fruits and vegetables, whole grains, lean proteins, low-fat dairy, and heart-healthy fats. With the DASH eating plan, you should limit salt (sodium) intake to 2,300 mg a day. If you have hypertension, you may need to reduce your sodium intake to 1,500 mg a day. Work with your health care provider or dietitian to adjust your eating plan to your individual calorie needs. This information is not intended to replace advice given to you by your health care provider. Make sure you discuss any questions you have with your health care provider. Document Revised: 03/18/2019 Document Reviewed: 03/18/2019 Elsevier Patient Education  2023 [place], Finis Bud, [place]  11/20/2021 11:32 AM    [place]

## 2021-11-19 NOTE — Assessment & Plan Note (Signed)
Controlled.  Continue sertraline 100 mg daily.

## 2021-11-20 ENCOUNTER — Encounter (HOSPITAL_BASED_OUTPATIENT_CLINIC_OR_DEPARTMENT_OTHER): Payer: Self-pay | Admitting: Nurse Practitioner

## 2021-11-20 ENCOUNTER — Ambulatory Visit (INDEPENDENT_AMBULATORY_CARE_PROVIDER_SITE_OTHER): Payer: Medicare Other

## 2021-11-20 ENCOUNTER — Ambulatory Visit (HOSPITAL_BASED_OUTPATIENT_CLINIC_OR_DEPARTMENT_OTHER): Payer: Medicare Other | Admitting: Nurse Practitioner

## 2021-11-20 ENCOUNTER — Other Ambulatory Visit: Payer: Self-pay | Admitting: Primary Care

## 2021-11-20 VITALS — BP 112/72 | HR 72 | Ht 64.5 in | Wt 263.2 lb

## 2021-11-20 DIAGNOSIS — R002 Palpitations: Secondary | ICD-10-CM

## 2021-11-20 DIAGNOSIS — R001 Bradycardia, unspecified: Secondary | ICD-10-CM | POA: Diagnosis not present

## 2021-11-20 DIAGNOSIS — I10 Essential (primary) hypertension: Secondary | ICD-10-CM

## 2021-11-20 DIAGNOSIS — R0683 Snoring: Secondary | ICD-10-CM

## 2021-11-20 DIAGNOSIS — F339 Major depressive disorder, recurrent, unspecified: Secondary | ICD-10-CM

## 2021-11-20 DIAGNOSIS — Z6841 Body Mass Index (BMI) 40.0 and over, adult: Secondary | ICD-10-CM

## 2021-11-20 DIAGNOSIS — E785 Hyperlipidemia, unspecified: Secondary | ICD-10-CM

## 2021-11-20 DIAGNOSIS — E66813 Obesity, class 3: Secondary | ICD-10-CM

## 2021-11-20 LAB — URINE CULTURE
MICRO NUMBER:: 13691478
Result:: NO GROWTH
SPECIMEN QUALITY:: ADEQUATE

## 2021-11-20 MED ORDER — METOPROLOL TARTRATE 25 MG PO TABS: 12.5000 mg | ORAL_TABLET | Freq: Two times a day (BID) | ORAL | 3 refills | Status: DC

## 2021-11-20 NOTE — Patient Instructions (Signed)
Medication Instructions:  Your physician has recommended you make the following change in your medication:  Change: Metoprolol 12.'5mg'$  twice daily   *If you need a refill on your cardiac medications before your next appointment, please call your pharmacy*   Lab Work: Your physician recommends that you return for lab work today: TSH, Mag   If you have labs (blood work) drawn today and your tests are completely normal, you will receive your results only by: [place] (if you have MyChart) OR A paper copy in the mail If you have any lab test that is abnormal or we need to change your treatment, we will call you to review the results.   Testing/Procedures: Your physician has recommended that you wear a Zio monitor.   This monitor is a medical device that records the heart's electrical activity. Doctors most often use these monitors to diagnose arrhythmias. Arrhythmias are problems with the speed or rhythm of the heartbeat. The monitor is a small device applied to your chest. You can wear one while you do your normal daily activities. While wearing this monitor if you have any symptoms to push the button and record what you felt. Once you have worn this monitor for the period of time provider prescribed (Usually 14 days), you will return the monitor device in the postage paid box. Once it is returned they will download the data collected and provide [place] with a report which the provider will then review and we will call you with those results. Important tips:  Avoid showering during the first 24 hours of wearing the monitor. Avoid excessive sweating to help maximize wear time. Do not submerge the device, no hot tubs, and no swimming pools. Keep any lotions or oils away from the patch. After 24 hours you may shower with the patch on. Take brief showers with your back facing the shower head.  Do not remove patch once it has been placed because that will interrupt data and decrease adhesive  wear time. Push the button when you have any symptoms and write down what you were feeling. Once you have completed wearing your monitor, remove and place into box which has postage paid and place in your outgoing mailbox.  If for some reason you have misplaced your box then call our office and we can provide another box and/or mail it off for you.  Your physician has recommended that you have a sleep study. This test records several body functions during sleep, including: brain activity, eye movement, oxygen and carbon dioxide blood levels, heart rate and rhythm, breathing rate and rhythm, the flow of air through your mouth and nose, snoring, body muscle movements, and chest and belly movement.   Follow-Up: At [facility], you and your health needs are our priority.  As part of our continuing mission to provide you with exceptional heart care, we have created designated Provider Care Teams.  These Care Teams include your primary Cardiologist (physician) and Advanced Practice Providers (APPs -  Physician Assistants and Nurse Practitioners) who all work together to provide you with the care you need, when you need it.  We recommend signing up for the patient portal called "MyChart".  Sign up information is provided on this After Visit Summary.  MyChart is used to connect with patients for Virtual Visits (Telemedicine).  Patients are able to view lab/test results, encounter notes, upcoming appointments, etc.  Non-urgent messages can be sent to your provider as well.   To learn more about what you  can do with MyChart, go to [email or web address].    Your next appointment:   4-6 week(s)  The format for your next appointment:   In Person  Provider: Claiborne Billings or APP {  Other Instructions  Tips to Measure your Blood Pressure Correctly  Please check your blood pressure 1x per day 1-2 hours after taking medications and resting 5-10 minutes. Please keep a log and bring to follow up appointments.    To determine whether you have hypertension, a medical professional will take a blood pressure reading. How you prepare for the test, the position of your arm, and other factors can change a blood pressure reading by 10% or more. That could be enough to hide high blood pressure, start you on a drug you don't really need, or lead your doctor to incorrectly adjust your medications.  National and international guidelines offer specific instructions for measuring blood pressure. If a doctor, nurse, or medical assistant isn't doing it right, don't hesitate to ask him or her to get with the guidelines.  Here's what you can do to ensure a correct reading:  Don't drink a caffeinated beverage or smoke during the 30 minutes before the test.  Sit quietly for five minutes before the test begins.  During the measurement, sit in a chair with your feet on the floor and your arm supported so your elbow is at about heart level.  The inflatable part of the cuff should completely cover at least 80% of your upper arm, and the cuff should be placed on bare skin, not over a shirt.  Don't talk during the measurement.  Have your blood pressure measured twice, with a brief break in between. If the readings are different by 5 points or more, have it done a third time.  In 2017, new guidelines from the [place], the [employer] of Cardiology, and nine other health organizations lowered the diagnosis of high blood pressure to 130/80 mm Hg or higher for all adults. The guidelines also redefined the various blood pressure categories to now include normal, elevated, Stage 1 hypertension, Stage 2 hypertension, and hypertensive crisis (see "Blood pressure categories").  Blood pressure categories  Blood pressure category SYSTOLIC (upper number)  DIASTOLIC (lower number)  Normal Less than 120 mm Hg and Less than 80 mm Hg  Elevated 120-129 mm Hg and Less than 80 mm Hg  High blood pressure: Stage 1  hypertension 130-139 mm Hg or 80-89 mm Hg  High blood pressure: Stage 2 hypertension 140 mm Hg or higher or 90 mm Hg or higher  Hypertensive crisis (consult your doctor immediately) Higher than 180 mm Hg and/or Higher than 120 mm Hg  Source: American Heart Association and American Stroke Association. For more on getting your blood pressure under control, buy Controlling Your Blood Pressure, a Special Health Report from [facility].   Blood Pressure Log   Date   Time  Blood Pressure  Position  Example: Nov 1 9 AM 124/78 sitting                                                   DASH Eating Plan DASH stands for Dietary Approaches to Stop Hypertension. The DASH eating plan is a healthy eating plan that has been shown to: Reduce high blood pressure (hypertension). Reduce your risk for type 2 diabetes, heart  disease, and stroke. Help with weight loss. What are tips for following this plan? Reading food labels Check food labels for the amount of salt (sodium) per serving. Choose foods with less than 5 percent of the Daily Value of sodium. Generally, foods with less than 300 milligrams (mg) of sodium per serving fit into this eating plan. To find whole grains, look for the word "whole" as the first word in the ingredient list. Shopping Buy products labeled as "low-sodium" or "no salt added." Buy fresh foods. Avoid canned foods and pre-made or frozen meals. Cooking Avoid adding salt when cooking. Use salt-free seasonings or herbs instead of table salt or sea salt. Check with your health care provider or pharmacist before using salt substitutes. Do not fry foods. Cook foods using healthy methods such as baking, boiling, grilling, roasting, and broiling instead. Cook with heart-healthy oils, such as olive, canola, avocado, soybean, or sunflower oil. Meal planning  Eat a balanced diet that includes: 4 or more servings of fruits and 4 or more servings of vegetables  each day. Try to fill one-half of your plate with fruits and vegetables. 6-8 servings of whole grains each day. Less than 6 oz (170 g) of lean meat, poultry, or fish each day. A 3-oz (85-g) serving of meat is about the same size as a deck of cards. One egg equals 1 oz (28 g). 2-3 servings of low-fat dairy each day. One serving is 1 cup (237 mL). 1 serving of nuts, seeds, or beans 5 times each week. 2-3 servings of heart-healthy fats. Healthy fats called omega-3 fatty acids are found in foods such as walnuts, flaxseeds, fortified milks, and eggs. These fats are also found in cold-water fish, such as sardines, salmon, and mackerel. Limit how much you eat of: Canned or prepackaged foods. Food that is high in trans fat, such as some fried foods. Food that is high in saturated fat, such as fatty meat. Desserts and other sweets, sugary drinks, and other foods with added sugar. Full-fat dairy products. Do not salt foods before eating. Do not eat more than 4 egg yolks a week. Try to eat at least 2 vegetarian meals a week. Eat more home-cooked food and less restaurant, buffet, and fast food. Lifestyle When eating at a restaurant, ask that your food be prepared with less salt or no salt, if possible. If you drink alcohol: Limit how much you use to: 0-1 drink a day for women who are not pregnant. 0-2 drinks a day for men. Be aware of how much alcohol is in your drink. In the [place] one drink equals one 12 oz bottle of beer (355 mL), one 5 oz glass of wine (148 mL), or one 1 oz glass of hard liquor (44 mL). General information Avoid eating more than 2,300 mg of salt a day. If you have hypertension, you may need to reduce your sodium intake to 1,500 mg a day. Work with your health care provider to maintain a healthy body weight or to lose weight. Ask what an ideal weight is for you. Get at least 30 minutes of exercise that causes your heart to beat faster (aerobic exercise) most days of the week.  Activities may include walking, swimming, or biking. Work with your health care provider or dietitian to adjust your eating plan to your individual calorie needs. What foods should I eat? Fruits All fresh, dried, or frozen fruit. Canned fruit in natural juice (without added sugar). Vegetables Fresh or frozen vegetables (raw, steamed, roasted,  or grilled). Low-sodium or reduced-sodium tomato and vegetable juice. Low-sodium or reduced-sodium tomato sauce and tomato paste. Low-sodium or reduced-sodium canned vegetables. Grains Whole-grain or whole-wheat bread. Whole-grain or whole-wheat pasta. Brown rice. [place]. Bulgur. Whole-grain and low-sodium cereals. Pita bread. Low-fat, low-sodium crackers. Whole-wheat flour tortillas. Meats and other proteins Skinless chicken or [place]. Ground chicken or [place]. Pork with fat trimmed off. Fish and seafood. Egg whites. Dried beans, peas, or lentils. Unsalted nuts, nut butters, and seeds. Unsalted canned beans. Lean cuts of beef with fat trimmed off. Low-sodium, lean precooked or cured meat, such as sausages or meat loaves. Dairy Low-fat (1%) or fat-free (skim) milk. Reduced-fat, low-fat, or fat-free cheeses. Nonfat, low-sodium ricotta or cottage cheese. Low-fat or nonfat yogurt. Low-fat, low-sodium cheese. Fats and oils Soft margarine without trans fats. Vegetable oil. Reduced-fat, low-fat, or light mayonnaise and salad dressings (reduced-sodium). Canola, safflower, olive, avocado, soybean, and sunflower oils. Avocado. Seasonings and condiments Herbs. Spices. Seasoning mixes without salt. Other foods Unsalted popcorn and pretzels. Fat-free sweets. The items listed above may not be a complete list of foods and beverages you can eat. Contact a dietitian for more information. What foods should I avoid? Fruits Canned fruit in a light or heavy syrup. Fried fruit. Fruit in cream or butter sauce. Vegetables Creamed or fried vegetables. Vegetables in a  cheese sauce. Regular canned vegetables (not low-sodium or reduced-sodium). Regular canned tomato sauce and paste (not low-sodium or reduced-sodium). Regular tomato and vegetable juice (not low-sodium or reduced-sodium). Angie Fava. Olives. Grains Baked goods made with fat, such as croissants, muffins, or some breads. Dry pasta or rice meal packs. Meats and other proteins Fatty cuts of meat. Ribs. Fried meat. Berniece Salines. Bologna, salami, and other precooked or cured meats, such as sausages or meat loaves. Fat from the back of a pig (fatback). Bratwurst. Salted nuts and seeds. Canned beans with added salt. Canned or smoked fish. Whole eggs or egg yolks. Chicken or [place] with skin. Dairy Whole or 2% milk, cream, and half-and-half. Whole or full-fat cream cheese. Whole-fat or sweetened yogurt. Full-fat cheese. Nondairy creamers. Whipped toppings. Processed cheese and cheese spreads. Fats and oils Butter. Stick margarine. Lard. Shortening. Ghee. Bacon fat. Tropical oils, such as coconut, palm kernel, or palm oil. Seasonings and condiments Onion salt, garlic salt, seasoned salt, table salt, and sea salt. Worcestershire sauce. Tartar sauce. Barbecue sauce. Teriyaki sauce. Soy sauce, including reduced-sodium. Steak sauce. Canned and packaged gravies. Fish sauce. Oyster sauce. Cocktail sauce. Store-bought horseradish. Ketchup. Mustard. Meat flavorings and tenderizers. Bouillon cubes. Hot sauces. Pre-made or packaged marinades. Pre-made or packaged taco seasonings. Relishes. Regular salad dressings. Other foods Salted popcorn and pretzels. The items listed above may not be a complete list of foods and beverages you should avoid. Contact a dietitian for more information. Where to find more information National Heart, Lung, and Blood Institute: https://Nyaira Hodgens-eaton.com/ American Heart Association: www.heart.org Academy of Nutrition and Dietetics: www.eatright.[place]: www.kidney.org Summary The DASH  eating plan is a healthy eating plan that has been shown to reduce high blood pressure (hypertension). It may also reduce your risk for type 2 diabetes, heart disease, and stroke. When on the DASH eating plan, aim to eat more fresh fruits and vegetables, whole grains, lean proteins, low-fat dairy, and heart-healthy fats. With the DASH eating plan, you should limit salt (sodium) intake to 2,300 mg a day. If you have hypertension, you may need to reduce your sodium intake to 1,500 mg a day. Work with your health care provider or  dietitian to adjust your eating plan to your individual calorie needs. This information is not intended to replace advice given to you by your health care provider. Make sure you discuss any questions you have with your health care provider. Document Revised: 03/18/2019 Document Reviewed: 03/18/2019 Elsevier Patient Education  [place].

## 2021-11-21 ENCOUNTER — Telehealth: Payer: Self-pay

## 2021-11-21 LAB — TSH: TSH: 2.46 u[IU]/mL (ref 0.450–4.500)

## 2021-11-21 LAB — MAGNESIUM: Magnesium: 2.5 mg/dL — ABNORMAL HIGH (ref 1.6–2.3)

## 2021-11-21 NOTE — Telephone Encounter (Signed)
Patient called and stated that he would like to discuss his test results. Call back number [phone].

## 2021-11-21 NOTE — Telephone Encounter (Signed)
Spoke with patient regarding his results.

## 2021-11-21 NOTE — Telephone Encounter (Signed)
-----   Message from Pleas [name], NP sent at 11/21/2021  6:45 AM EDT ----- Please notify patient:  Urine test was negative for infection.

## 2021-11-21 NOTE — Telephone Encounter (Signed)
I left a message for the patient to return my call.

## 2021-11-25 ENCOUNTER — Telehealth (HOSPITAL_BASED_OUTPATIENT_CLINIC_OR_DEPARTMENT_OTHER): Payer: Self-pay

## 2021-11-25 NOTE — Telephone Encounter (Signed)
Patient returned call to the office, patient does not take an OTC Mag. Will route to Finis Bud, NP for follow up.      "Please update patient regarding recent lab work. Mag level is slightly elevated, can you please ask patient if he is taking an OTC Mag supplement?    Thanks!    Finis Bud, NP"

## 2021-11-25 NOTE — Telephone Encounter (Signed)
RN returned call to patient, no answer, left message (ok  per dpr) with the following information.    Per Finis Bud, NP   "Thanks Eric Hill for the update about his Mag. His kidney function is normal. No need for further evaluation. He should continue to follow up with PCP and we will continue the current treatment regimen as discussed.   Thanks!"

## 2021-11-25 NOTE — Telephone Encounter (Addendum)
Called results to patient and left results on VM (ok per DPR), instructions left to call office back to let us know if he has been taking OTC Mag supplement.    ----- Message from Finis Bud, NP sent at 11/22/2021  5:12 PM EDT ----- Please update patient regarding recent lab work. Mag level is slightly elevated, can you please ask patient if he is taking an OTC Mag supplement?   Thanks!   Finis Bud, NP

## 2021-11-25 NOTE — Telephone Encounter (Signed)
Pt returning call. 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

## 2021-12-11 ENCOUNTER — Other Ambulatory Visit: Payer: Self-pay | Admitting: Primary Care

## 2021-12-11 DIAGNOSIS — F320 Major depressive disorder, single episode, mild: Secondary | ICD-10-CM

## 2021-12-12 ENCOUNTER — Telehealth: Payer: Self-pay | Admitting: Primary Care

## 2021-12-12 NOTE — Telephone Encounter (Signed)
This was sent to pharmacy prior to patient calling. Make sure he calls his pharmacy to check on the status.

## 2021-12-12 NOTE — Telephone Encounter (Signed)
  Encourage patient to contact the pharmacy for refills or they can request refills through [facility]  Did the patient contact the pharmacy:  y   LAST APPOINTMENT DATE: 11/19/21  NEXT APPOINTMENT DATE:05/22/22  MEDICATION:sertraline (ZOLOFT) 100 MG tablet  Is the patient out of medication? y  If not, how much is left?  Is this a 90 day supply:   PHARMACY: [id number] [place], [place] - [address] Phone:  [phone] Fax:  [phone]     Let patient know to contact pharmacy at the end of the day to make sure medication is ready.  Please notify patient to allow 48-72 hours to process

## 2021-12-13 NOTE — Telephone Encounter (Signed)
Left message to return call to our office.  

## 2021-12-17 DIAGNOSIS — R002 Palpitations: Secondary | ICD-10-CM | POA: Diagnosis not present

## 2021-12-17 DIAGNOSIS — I10 Essential (primary) hypertension: Secondary | ICD-10-CM | POA: Diagnosis not present

## 2021-12-17 NOTE — Telephone Encounter (Signed)
Patient already picked up.

## 2021-12-26 NOTE — Progress Notes (Signed)
Cardiology Clinic Note   Patient Name: Eric Hill Date of Encounter: 12/27/2021  Primary Care Provider:  Pleas [name], NP Primary Cardiologist:  Shelva Majestic,  Patient Profile    71 year old male with history of hyperlipidemia, hypertension, type 2 diabetes, chronic chest wall pain, palpitations, obesity, depression, IBS, and diverticulitis.  Most recent echocardiogram on 02/03/2018 revealed normal LVEF of 55 to 60%, there were no valvular abnormalities seen.  When last seen by Dr. Claiborne Billings by telemedicine, on 10/25/2019 blood pressure was not well controlled.  His target blood pressure was 130/80.  Metoprolol was increased to 25 mg twice daily with plan to increase lisinopril if blood pressure was not stabilized.  Hyperlipidemia is being followed by Alma Friendly, NP primary care provider.  He was seen in the office by Finis Bud, NP on 11/20/2021 with complaints of slow heart rate at night which occurred 4 to 5 days prior to being seen.  He also complained of palpitations during the night but had resolved the following day upon awakening.  Blood pressure at that time was not well controlled with readings of 160/70.  He admitted to dietary noncompliance with fast food and alcohol ingestion.  The patient was planned for 14-day ZIO monitor and a TSH and magnesium.  Metoprolol was decreased to 12.5 mg twice daily.  He was also set up for OSA evaluation.  At the time of this office visit sleep study has not been read.  Pedical History    Past Medical History:  Diagnosis Date   Allergy    seasonal   Cataract    no surgery yet   COVID-19 virus infection 06/15/2020   Depression    Diverticulitis    Erectile dysfunction    History of depression    History of IBS    has abd pain   Hyperlipidemia    Hypertension    Type 2 diabetes mellitus ([place])    Past Surgical History:  Procedure Laterality Date   COLONOSCOPY     NO PAST SURGERIES      Allergies  No Known  Allergies  History of Present Illness    Eric Hill  hyperlipidemia, hypertension, type 2 diabetes, chronic chest wall pain, palpitations. Saw PCP for worsening palpitations in July and was found to have uncontrolled hypertension. Zio monitor was placed and he was scheduled for sleep study.   Monitor revealed that predominant underlying rhythm was Sinus Rhythm. 2 Supraventricular Tachycardia runs occurred, the run with the fastest interval lasting 5 beats with a max rate of 139 bpm, the longest lasting 6 beats with an avg rate of 121 bpm.  He comes today with continued complaints of palpitations.  He did not take metoprolol today in order to have an EKG off of the medications.  The patient was recently placed on vitamin D by primary care which he states has helped his brain fog and energy level along with helping slightly with palpitations.  Home Medications    Current Outpatient Medications  Medication Sig Dispense Refill   atorvastatin (LIPITOR) 20 MG tablet TAKE 1 TABLET BY MOUTH DAILY. FOR CHOLESTEROL 90 tablet 3   blood glucose meter kit and supplies KIT Dispense based on patient and insurance preference. Use up to 2-3 times daily as directed. Dx is E11.9 1 each 0   cetirizine (ZYRTEC) 10 MG tablet Take 1 tablet (10 mg total) by mouth daily. For cough and drainage. 90 tablet 0   cholecalciferol (VITAMIN D3) 25 MCG (1000 UNIT) tablet Take  1,000 Units by mouth daily.     glipiZIDE (GLUCOTROL XL) 10 MG 24 hr tablet TAKE 1 TABLET BY MOUTH IN THE MORNING WITH FOOD FOR DIABETES 90 tablet 1   metoprolol succinate (TOPROL XL) 25 MG 24 hr tablet Take 0.5 tablets (12.5 mg total) by mouth 2 (two) times daily. 180 tablet 2   olmesartan (BENICAR) 20 MG tablet TAKE 1 TABLET BY MOUTH EVERY DAY FOR BLOOD PRESSURE 90 tablet 3   omeprazole (PRILOSEC) 20 MG capsule TAKE 1 CAPSULE (20 MG TOTAL) BY MOUTH DAILY. FOR COUGH AND HEARTBURN. 90 capsule 0   psyllium (METAMUCIL) 58.6 % powder Take 1 packet by mouth  as needed.     sertraline (ZOLOFT) 100 MG tablet TAKE 1 TABLET BY MOUTH DAILY FOR ANXIETY AND DEPRESSION 90 tablet 3   tamsulosin (FLOMAX) 0.4 MG CAPS capsule TAKE 2 CAPSULES BY MOUTH DAILY. FOR URINE FLOW 180 capsule 0   Vitamin D, Ergocalciferol, (DRISDOL) 1.25 MG (50000 UNIT) CAPS capsule Take 1 capsule by mouth once weekly for vitamin D 12 capsule 0   No current facility-administered medications for this visit.     Family History    Family History  Problem Relation Age of Onset   Neuropathy Mother    Cancer Sister        Back, brain   Cancer Maternal Grandfather        Abdominal    Stomach cancer Maternal Grandfather    Colon cancer Neg Hx    Esophageal cancer Neg Hx    Rectal cancer Neg Hx    He indicated that his mother is deceased. He indicated that his father is deceased. He indicated that two of his three sisters are alive. He indicated that the status of his maternal grandfather is unknown. He indicated that the status of his neg hx is unknown.  Social History    Social History   Socioeconomic History   Marital status: Single    Spouse name: Not on file   Number of children: Not on file   Years of education: Not on file   Highest education level: Not on file  Occupational History   Not on file  Tobacco Use   Smoking status: Former    Types: Cigarettes    Quit date: 11/18/1990    Years since quitting: 31.1   Smokeless tobacco: Never  Substance and Sexual Activity   Alcohol use: Not Currently    Comment: rarely   Drug use: No   Sexual activity: Not on file  Other Topics Concern   Not on file  Social History Narrative   Single   Works as a Mudlogger.   No children   Lives in [place]   Enjoys racing cars, Designer, fashion/clothing.   Social Determinants of Health   Financial Resource Strain: Low Risk  (04/25/2021)   Overall Financial Resource Strain (CARDIA)    Difficulty of Paying Living Expenses: Not hard at all  Food Insecurity: No Food Insecurity  (04/25/2021)   Hunger Vital Sign    Worried About Running Out of Food in the Last Year: Never true    Ran Out of Food in the Last Year: Never true  Transportation Needs: No Transportation Needs (04/25/2021)   PRAPARE - Hydrologist (Medical): No    Lack of Transportation (Non-Medical): No  Physical Activity: Inactive (04/25/2021)   Exercise Vital Sign    Days of Exercise per Week: 0 days    Minutes  of Exercise per Session: 0 min  Stress: No Stress Concern Present (04/25/2021)   [place]    Feeling of Stress : Not at all  Social Connections: Socially Isolated (04/25/2021)   Social Connection and Isolation Panel [NHANES]    Frequency of Communication with Friends and Family: Three times a week    Frequency of Social Gatherings with Friends and Family: Three times a week    Attends Religious Services: Never    Active Member of Clubs or Organizations: No    Attends Archivist Meetings: Never    Marital Status: Never married  Intimate Partner Violence: Not At Risk (04/25/2021)   Humiliation, Afraid, Rape, and Kick questionnaire    Fear of Current or Ex-Partner: No    Emotionally Abused: No    Physically Abused: No    Sexually Abused: No     Review of Systems    General:  No chills, fever, night sweats or weight changes.  Obese Cardiovascular:  No chest pain, dyspnea on exertion, edema, orthopnea, positive for palpitations, paroxysmal nocturnal dyspnea.  Chest wall pain on occasion. Dermatological: No rash, lesions/masses Respiratory: No cough, dyspnea Urologic: No hematuria, dysuria Abdominal:   No nausea, vomiting, diarrhea, bright red blood per rectum, melena, or hematemesis Neurologic:  No visual changes, wkns, changes in mental status. All other systems reviewed and are otherwise negative except as noted above.     Physical Exam    VS:  BP 130/74   Pulse 70   Ht 5'  5" (1.651 m)   Wt 262 lb (118.8 kg)   SpO2 96%   BMI 43.60 kg/m  , BMI Body mass index is 43.6 kg/m. STOP-Bang Score:  7      GEN: Well nourished, well developed, in no acute distress. HEENT: normal. Neck: Supple, no JVD, carotid bruits, or masses. Cardiac: Irregular RRR, no murmurs, rubs, or gallops. No clubbing, cyanosis, edema.  Radials/DP/PT 2+ and equal bilaterally.  Respiratory:  Respirations regular and unlabored, clear to auscultation bilaterally. GI: Soft, nontender, nondistended, BS + x 4.  Central obesity noted MS: no deformity or atrophy. Skin: warm and dry, no rash. Neuro:  Strength and sensation are intact. Psych: Normal affect.  Accessory Clinical Findings    ECG personally reviewed by me today-sinus rhythm with occasional PVCs, heart rate of 66 bpm- No acute changes  Lab Results  Component Value Date   WBC 8.8 11/19/2021   HGB 14.8 11/19/2021   HCT 43.2 11/19/2021   MCV 88.1 11/19/2021   PLT 236.0 11/19/2021   Lab Results  Component Value Date   CREATININE 0.74 11/19/2021   BUN 19 11/19/2021   NA 139 11/19/2021   K 4.1 11/19/2021   CL 104 11/19/2021   CO2 24 11/19/2021   Lab Results  Component Value Date   ALT 14 11/19/2021   AST 15 11/19/2021   ALKPHOS 71 11/19/2021   BILITOT 0.8 11/19/2021   Lab Results  Component Value Date   CHOL 118 11/19/2021   HDL 42.20 11/19/2021   LDLCALC 56 11/19/2021   LDLDIRECT 100.0 11/03/2019   TRIG 100.0 11/19/2021   CHOLHDL 3 11/19/2021    Lab Results  Component Value Date   HGBA1C 6.9 (H) 11/19/2021    Review of Prior Studies:  Heart monitor 11/28/2021 Patient had a min HR of 48 bpm, max HR of 139 bpm, and avg HR of 75 bpm. Predominant underlying rhythm was Sinus Rhythm. 2 Supraventricular  Tachycardia runs occurred, the run with the fastest interval lasting 5 beats with a max rate of 139 bpm, the longest lasting 6 beats with an avg rate of 121 bpm. Isolated SVEs were rare (<1.0%), SVE Couplets were rare  (<1.0%), and SVE Triplets were rare (<1.0%). Isolated VEs were frequent (13.5%, [id number]), VE Couplets were rare (<1.0%, 46), and VE Triplets were rare (<1.0%, 13). Ventricular Bigeminy and Trigeminy were present.       2D echo on February 03, 2018: Left ventricle: The cavity size is normal.  Systolic function was normal.  The estimated ejection fraction was in the range of 55% to 60%.  Wall motion was normal; there were no regional wall motion abnormalities.  The study is not technically sufficient to allow evaluation of LV diastolic function. 2. Aortic valve: Transvalvular velocity was within the normal range.  There is no stenosis.  There is trivial regurgitation. 3. Mitral valve: Transvalvular velocity was within normal range.  There is no evidence for stenosis.  There was no regurgitation. 4. Right ventricle the cavity size was normal.  Wall thickness was normal.  Systolic function was normal. 5.  Atrial septum: No defect or patent foramen ovale was identified by color-flow Doppler. 6.  Tricuspid valve: There was trivial regurgitation. 7.  Pulmonary arteries: Systolic pressure was within the normal range.  PA peak pressure: 25 mmHg.    Assessment & Plan   1.  PVCs: He has been suffering with this for a few years now.  Primary care did check vitamin D level which was low and he was placed on vitamin D which he states is helped him especially with brain fog and energy.  He did not take metoprolol today to allow [place] to evaluate his PVC burden off the medication.  I have advised him to return to metoprolol succinate 12.5 mg twice daily.  2.  OSA: Strong possibility that this is factoring into his overall cardiac health.  I did repeat the STOP BANG questionnaire, and this correlated with earlier scoring of 7.  I have spoken with him about having a home sleep study and he is willing to proceed.  This will be ordered again.  3.  Type 2 diabetes: Remains on glipizide, followed by primary care.  Hemoglobin  A1c is elevated according to the patient, and is to be rechecked on next appointment scheduled in January 2024.  4.  Morbid obesity: BMI 43.60, goal of BMI 28.  Cardiovascular risk factors of diabetes, hypertension.,  Unknown lipid status.  I discussed starting Weygovi or Ozempic, and referring him to Pharm.D. for weight loss, in the setting of multiple cardiovascular risk factors.  He wants to think about it.  He does have a sister who has been started on this and is lost a considerable amount of weight.  He appears interested but wishes to talk with family first.  He may also approach this with his primary care provider.   Current medicines are reviewed at length with the patient today.  I have spent 30 min's  dedicated to the care of this patient on the date of this encounter to include pre-visit review of records, assessment, management and diagnostic testing,with shared decision making.  Signed, Phill Myron. [place] Pugh, ANP, [place]   12/27/2021 12:31 PM      Office [phone] Fax [phone]  Notice: This dictation was prepared with Dragon dictation along with smaller phrase technology. Any transcriptional errors that result from this process are unintentional and may not be  corrected upon review.

## 2021-12-26 NOTE — Progress Notes (Signed)
[facility] Quality Team Note  Name: Eric Hill Date of Birth: 04/14/1951 MRN: [id number] Date: 12/26/2021  [facility] Quality Team has reviewed this patient's chart, please see recommendations below:  [facility] Quality Other; Pt has quality gap for EED, needs retinal screening to close this. Free Retinal Screenings Events at [facility] on 01/08/2022 and 03/12/2022, please address at next visit and let Quality Coordinator know if pt is interested in scheduling.

## 2021-12-27 ENCOUNTER — Encounter: Payer: Self-pay | Admitting: Adult Health

## 2021-12-27 ENCOUNTER — Ambulatory Visit: Payer: Medicare Other | Attending: Adult Health | Admitting: Adult Health

## 2021-12-27 VITALS — BP 130/74 | HR 70 | Ht 65.0 in | Wt 262.0 lb

## 2021-12-27 DIAGNOSIS — G8929 Other chronic pain: Secondary | ICD-10-CM

## 2021-12-27 DIAGNOSIS — R002 Palpitations: Secondary | ICD-10-CM | POA: Diagnosis not present

## 2021-12-27 DIAGNOSIS — R0789 Other chest pain: Secondary | ICD-10-CM | POA: Diagnosis not present

## 2021-12-27 DIAGNOSIS — G4733 Obstructive sleep apnea (adult) (pediatric): Secondary | ICD-10-CM | POA: Diagnosis not present

## 2021-12-27 DIAGNOSIS — I10 Essential (primary) hypertension: Secondary | ICD-10-CM | POA: Diagnosis not present

## 2021-12-27 MED ORDER — METOPROLOL SUCCINATE ER 25 MG PO TB24: 12.5000 mg | ORAL_TABLET | Freq: Two times a day (BID) | ORAL | 2 refills | Status: DC

## 2021-12-27 NOTE — Patient Instructions (Signed)
Medication Instructions:  Stop Metoprolol Tartrate. Start Toprol-XL 12.5 mg ( Take 1 Tablet Twice Daily). *If you need a refill on your cardiac medications before your next appointment, please call your pharmacy*   Lab Work: None If you have labs (blood work) drawn today and your tests are completely normal, you will receive your results only by: [place] (if you have MyChart) OR A paper copy in the mail If you have any lab test that is abnormal or we need to change your treatment, we will call you to review the results.   Testing/Procedures: [place]. Your physician has recommended that you have a sleep study. This test records several body functions during sleep, including: brain activity, eye movement, oxygen and carbon dioxide blood levels, heart rate and rhythm, breathing rate and rhythm, the flow of air through your mouth and nose, snoring, body muscle movements, and chest and belly movement.    Follow-Up: At [facility], you and your health needs are our priority.  As part of our continuing mission to provide you with exceptional heart care, we have created designated Provider Care Teams.  These Care Teams include your primary Cardiologist (physician) and Advanced Practice Providers (APPs -  Physician Assistants and Nurse Practitioners) who all work together to provide you with the care you need, when you need it.  We recommend signing up for the patient portal called "MyChart".  Sign up information is provided on this After Visit Summary.  MyChart is used to connect with patients for Virtual Visits (Telemedicine).  Patients are able to view lab/test results, encounter notes, upcoming appointments, etc.  Non-urgent messages can be sent to your provider as well.   To learn more about what you can do with MyChart, go to [email or web address].    Your next appointment:   6 month(s)  The format for your next appointment:   In Person  Provider:    Shelva Majestic, MD

## 2022-01-09 ENCOUNTER — Other Ambulatory Visit: Payer: Self-pay | Admitting: Primary Care

## 2022-01-09 DIAGNOSIS — E119 Type 2 diabetes mellitus without complications: Secondary | ICD-10-CM

## 2022-01-30 ENCOUNTER — Other Ambulatory Visit: Payer: Self-pay | Admitting: Primary Care

## 2022-01-30 DIAGNOSIS — R3915 Urgency of urination: Secondary | ICD-10-CM

## 2022-02-05 ENCOUNTER — Other Ambulatory Visit: Payer: Self-pay | Admitting: Primary Care

## 2022-02-05 DIAGNOSIS — E119 Type 2 diabetes mellitus without complications: Secondary | ICD-10-CM

## 2022-04-01 NOTE — Progress Notes (Signed)
[facility] Quality Team Note  Name: Eric Hill Date of Birth: [birth year] MRN: [id number] Date: 04/01/2022  [facility] Quality Team has reviewed this patient's chart, please see recommendations below:  [facility] Quality Other; Pt has open quality gap for KED, needs Micro/Creat Urine test to close this. Please address at next visit.

## 2022-04-29 ENCOUNTER — Ambulatory Visit (INDEPENDENT_AMBULATORY_CARE_PROVIDER_SITE_OTHER): Payer: Medicare Other

## 2022-04-29 VITALS — Ht 65.0 in | Wt 262.0 lb

## 2022-04-29 DIAGNOSIS — Z0000 Encounter for general adult medical examination without abnormal findings: Secondary | ICD-10-CM

## 2022-04-29 NOTE — Patient Instructions (Addendum)
Eric Hill , Thank you for taking time to come for your Medicare Wellness Visit. I appreciate your ongoing commitment to your health goals. Please review the following plan we discussed and let me know if I can assist you in the future.   These are the goals we discussed:  Goals Addressed             This Visit's Progress    Maintain healthy lifestyle       Healthy diet Stay hydrated Stay active         This is a list of the screening recommended for you and due dates:  Health Maintenance  Topic Date Due   Yearly kidney health urinalysis for diabetes  11/13/2021   Eye exam for diabetics  05/21/2022*   Flu Shot  07/27/2022*   Zoster (Shingles) Vaccine (1 of 2) 07/29/2022*   Hemoglobin A1C  05/22/2022   Yearly kidney function blood test for diabetes  11/20/2022   Complete foot exam   11/20/2022   Medicare Annual Wellness Visit  04/30/2023   DTaP/Tdap/Td vaccine (2 - Tdap) 12/24/2025   Colon Cancer Screening  12/01/2028   Pneumonia Vaccine  Completed   Hepatitis C Screening: USPSTF Recommendation to screen - Ages 70-79 yo.  Completed   HPV Vaccine  Aged Out   COVID-19 Vaccine  Discontinued  *Topic was postponed. The date shown is not the original due date.   Schedule annual diabetic eye exam.   Next appointment: Follow up in one year for your annual wellness visit.   Preventive Care 70 Years and Older, Male  Preventive care refers to lifestyle choices and visits with your health care provider that can promote health and wellness. What does preventive care include? A yearly physical exam. This is also called an annual well check. Dental exams once or twice a year. Routine eye exams. Ask your health care provider how often you should have your eyes checked. Personal lifestyle choices, including: Daily care of your teeth and gums. Regular physical activity. Eating a healthy diet. Avoiding tobacco and drug use. Limiting alcohol use. Practicing safe sex. Taking low  doses of aspirin every day. Taking vitamin and mineral supplements as recommended by your health care provider. What happens during an annual well check? The services and screenings done by your health care provider during your annual well check will depend on your age, overall health, lifestyle risk factors, and family history of disease. Counseling  Your health care provider may ask you questions about your: Alcohol use. Tobacco use. Drug use. Emotional well-being. Home and relationship well-being. Sexual activity. Eating habits. History of falls. Memory and ability to understand (cognition). Work and work Statistician. Screening  You may have the following tests or measurements: Height, weight, and BMI. Blood pressure. Lipid and cholesterol levels. These may be checked every 5 years, or more frequently if you are over 51 years old. Skin check. Lung cancer screening. You may have this screening every year starting at age 70 if you have a 30-pack-year history of smoking and currently smoke or have quit within the past 15 years. Fecal occult blood test (FOBT) of the stool. You may have this test every year starting at age 70. Flexible sigmoidoscopy or colonoscopy. You may have a sigmoidoscopy every 5 years or a colonoscopy every 10 years starting at age 70. Prostate cancer screening. Recommendations will vary depending on your family history and other risks. Hepatitis C blood test. Hepatitis B blood test. Sexually transmitted disease (STD) testing.  Diabetes screening. This is done by checking your blood sugar (glucose) after you have not eaten for a while (fasting). You may have this done every 1-3 years. Abdominal aortic aneurysm (AAA) screening. You may need this if you are a current or former smoker. Osteoporosis. You may be screened starting at age 70 if you are at high risk. Talk with your health care provider about your test results, treatment options, and if necessary, the need  for more tests. Vaccines  Your health care provider may recommend certain vaccines, such as: Influenza vaccine. This is recommended every year. Tetanus, diphtheria, and acellular pertussis (Tdap, Td) vaccine. You may need a Td booster every 10 years. Zoster vaccine. You may need this after age 70. Pneumococcal 13-valent conjugate (PCV13) vaccine. One dose is recommended after age 70. Pneumococcal polysaccharide (PPSV23) vaccine. One dose is recommended after age 70. Talk to your health care provider about which screenings and vaccines you need and how often you need them. This information is not intended to replace advice given to you by your health care provider. Make sure you discuss any questions you have with your health care provider. Document Released: 05/11/2015 Document Revised: 01/02/2016 Document Reviewed: 02/13/2015 Elsevier Interactive Patient Education  2017 [employer] Prevention in the Home Falls can cause injuries. They can happen to people of all ages. There are many things you can do to make your home safe and to help prevent falls. What can I do on the outside of my home? Regularly fix the edges of walkways and driveways and fix any cracks. Remove anything that might make you trip as you walk through a door, such as a raised step or threshold. Trim any bushes or trees on the path to your home. Use bright outdoor lighting. Clear any walking paths of anything that might make someone trip, such as rocks or tools. Regularly check to see if handrails are loose or broken. Make sure that both sides of any steps have handrails. Any raised decks and porches should have guardrails on the edges. Have any leaves, snow, or ice cleared regularly. Use sand or salt on walking paths during winter. Clean up any spills in your garage right away. This includes oil or grease spills. What can I do in the bathroom? Use night lights. Install grab bars by the toilet and in the tub and  shower. Do not use towel bars as grab bars. Use non-skid mats or decals in the tub or shower. If you need to sit down in the shower, use a plastic, non-slip stool. Keep the floor dry. Clean up any water that spills on the floor as soon as it happens. Remove soap buildup in the tub or shower regularly. Attach bath mats securely with double-sided non-slip rug tape. Do not have throw rugs and other things on the floor that can make you trip. What can I do in the bedroom? Use night lights. Make sure that you have a light by your bed that is easy to reach. Do not use any sheets or blankets that are too big for your bed. They should not hang down onto the floor. Have a firm chair that has side arms. You can use this for support while you get dressed. Do not have throw rugs and other things on the floor that can make you trip. What can I do in the kitchen? Clean up any spills right away. Avoid walking on wet floors. Keep items that you use a lot in easy-to-reach  places. If you need to reach something above you, use a strong step stool that has a grab bar. Keep electrical cords out of the way. Do not use floor polish or wax that makes floors slippery. If you must use wax, use non-skid floor wax. Do not have throw rugs and other things on the floor that can make you trip. What can I do with my stairs? Do not leave any items on the stairs. Make sure that there are handrails on both sides of the stairs and use them. Fix handrails that are broken or loose. Make sure that handrails are as long as the stairways. Check any carpeting to make sure that it is firmly attached to the stairs. Fix any carpet that is loose or worn. Avoid having throw rugs at the top or bottom of the stairs. If you do have throw rugs, attach them to the floor with carpet tape. Make sure that you have a light switch at the top of the stairs and the bottom of the stairs. If you do not have them, ask someone to add them for  you. What else can I do to help prevent falls? Wear shoes that: Do not have high heels. Have rubber bottoms. Are comfortable and fit you well. Are closed at the toe. Do not wear sandals. If you use a stepladder: Make sure that it is fully opened. Do not climb a closed stepladder. Make sure that both sides of the stepladder are locked into place. Ask someone to hold it for you, if possible. Clearly mark and make sure that you can see: Any grab bars or handrails. First and last steps. Where the edge of each step is. Use tools that help you move around (mobility aids) if they are needed. These include: Canes. Walkers. Scooters. Crutches. Turn on the lights when you go into a dark area. Replace any light bulbs as soon as they burn out. Set up your furniture so you have a clear path. Avoid moving your furniture around. If any of your floors are uneven, fix them. If there are any pets around you, be aware of where they are. Review your medicines with your doctor. Some medicines can make you feel dizzy. This can increase your chance of falling. Ask your doctor what other things that you can do to help prevent falls. This information is not intended to replace advice given to you by your health care provider. Make sure you discuss any questions you have with your health care provider. Document Released: 02/08/2009 Document Revised: 09/20/2015 Document Reviewed: 05/19/2014 Elsevier Interactive Patient Education  2017 [employer].

## 2022-04-29 NOTE — Progress Notes (Signed)
Subjective:   Eric Hill is a 72 y.o. male who presents for Medicare Annual/Subsequent preventive examination.  Review of Systems    No ROS.  Medicare Wellness Virtual Visit.  Visual/audio telehealth visit, UTA vital signs.   See social history for additional risk factors.   Cardiac Risk Factors include: advanced age (>60mn, >>49women);male gender;diabetes mellitus;hypertension     Objective:    Today's Vitals   04/29/22 0931  Weight: 262 lb (118.8 kg)  Height: _0  (1.651 m)   Body mass index is 43.6 kg/m.     04/25/2021    2:50 PM  Advanced Directives  Does Patient Have a Medical Advance Directive? No  Would patient like information on creating a medical advance directive? Yes (MAU/Ambulatory/Procedural Areas - Information given)    Current Medications (verified) Outpatient Encounter Medications as of 04/29/2022  Medication Sig   atorvastatin (LIPITOR) 20 MG tablet TAKE 1 TABLET BY MOUTH EVERY DAY FOR CHOLESTEROL   blood glucose meter kit and supplies KIT Dispense based on patient and insurance preference. Use up to 2-3 times daily as directed. Dx is E11.9   cetirizine (ZYRTEC) 10 MG tablet Take 1 tablet (10 mg total) by mouth daily. For cough and drainage.   cholecalciferol (VITAMIN D3) 25 MCG (1000 UNIT) tablet Take 1,000 Units by mouth daily.   glipiZIDE (GLUCOTROL XL) 10 MG 24 hr tablet TAKE 1 TABLET BY MOUTH IN THE MORNING WITH FOOD FOR DIABETES   metoprolol succinate (TOPROL XL) 25 MG 24 hr tablet Take 0.5 tablets (12.5 mg total) by mouth 2 (two) times daily.   olmesartan (BENICAR) 20 MG tablet TAKE 1 TABLET BY MOUTH EVERY DAY FOR BLOOD PRESSURE   omeprazole (PRILOSEC) 20 MG capsule TAKE 1 CAPSULE (20 MG TOTAL) BY MOUTH DAILY. FOR COUGH AND HEARTBURN.   psyllium (METAMUCIL) 58.6 % powder Take 1 packet by mouth as needed.   sertraline (ZOLOFT) 100 MG tablet TAKE 1 TABLET BY MOUTH DAILY FOR ANXIETY AND DEPRESSION   tamsulosin (FLOMAX) 0.4 MG CAPS capsule  TAKE 2 CAPSULES BY MOUTH DAILY. FOR URINE FLOW   Vitamin D, Ergocalciferol, (DRISDOL) 1.25 MG (50000 UNIT) CAPS capsule Take 1 capsule by mouth once weekly for vitamin D   No facility-administered encounter medications on file as of 04/29/2022.    Allergies (verified) Patient has no known allergies.   History: Past Medical History:  Diagnosis Date   Allergy    seasonal   Cataract    no surgery yet   COVID-19 virus infection 06/15/2020   Depression    Diverticulitis    Erectile dysfunction    History of depression    History of IBS    has abd pain   Hyperlipidemia    Hypertension    Type 2 diabetes mellitus (HCC)    Past Surgical History:  Procedure Laterality Date   COLONOSCOPY     NO PAST SURGERIES     Family History  Problem Relation Age of Onset   Neuropathy Mother    Cancer Sister        Back, brain   Cancer Maternal Grandfather        Abdominal    Stomach cancer Maternal Grandfather    Colon cancer Neg Hx    Esophageal cancer Neg Hx    Rectal cancer Neg Hx    Social History   Socioeconomic History   Marital status: Single    Spouse name: Not on file   Number of children: Not on file  Years of education: Not on file   Highest education level: Not on file  Occupational History   Not on file  Tobacco Use   Smoking status: Former    Types: Cigarettes    Quit date: 11/18/1990    Years since quitting: 31.4   Smokeless tobacco: Never  Substance and Sexual Activity   Alcohol use: Not Currently    Comment: rarely   Drug use: No   Sexual activity: Not on file  Other Topics Concern   Not on file  Social History Narrative   Single   Works as a Mudlogger.   No children   Lives in [place]   Enjoys racing cars, Designer, fashion/clothing.   Social Determinants of Health   Financial Resource Strain: Low Risk  (04/29/2022)   Overall Financial Resource Strain (CARDIA)    Difficulty of Paying Living Expenses: Not hard at all  Food Insecurity: No Food  Insecurity (04/29/2022)   Hunger Vital Sign    Worried About Running Out of Food in the Last Year: Never true    Ran Out of Food in the Last Year: Never true  Transportation Needs: No Transportation Needs (04/29/2022)   PRAPARE - Hydrologist (Medical): No    Lack of Transportation (Non-Medical): No  Physical Activity: Inactive (04/25/2021)   Exercise Vital Sign    Days of Exercise per Week: 0 days    Minutes of Exercise per Session: 0 min  Stress: No Stress Concern Present (04/29/2022)   [place]    Feeling of Stress : Not at all  Social Connections: Socially Isolated (04/29/2022)   Social Connection and Isolation Panel [NHANES]    Frequency of Communication with Friends and Family: Three times a week    Frequency of Social Gatherings with Friends and Family: Three times a week    Attends Religious Services: Never    Active Member of Clubs or Organizations: No    Attends Archivist Meetings: Never    Marital Status: Never married    Tobacco Counseling Counseling given: Not Answered   Clinical Intake:  Pre-visit preparation completed: Yes        Diabetes: Yes (Followed by pcp)  Nutrition Risk Assessment: Has the patient had any N/V/D within the last 2 months?  No  Does the patient have any non-healing wounds?  No  Has the patient had any unintentional weight loss or weight gain?  No   Diabetes: Is the patient diabetic?  Yes  If diabetic, was a CBG obtained today?  No  Did the patient bring in their glucometer from home?  No  How often do you monitor your CBG's? 3 times weekly.   Financial Strains and Diabetes Management: Are you having any financial strains with the device, your supplies or your medication? No .  Does the patient want to be seen by Chronic Care Management for management of their diabetes?  No  Would the patient like to be referred to a Nutritionist or  for Diabetic Management?  No   Diabetic Exams:  Diabetic Eye Exam: Overdue for diabetic eye exam. Pt has been advised about the importance in completing this exam. Patient advised to call and schedule an eye exam. Diabetic Foot Exam: Completed 11/19/21    How often do you need to have someone help you when you read instructions, pamphlets, or other written materials from your doctor or pharmacy?: 1 - Never  Interpreter Needed?: No      Activities of Daily Living    04/29/2022    9:42 AM  In your present state of health, do you have any difficulty performing the following activities:  Hearing? 0  Vision? 0  Difficulty concentrating or making decisions? 0  Walking or climbing stairs? 0  Dressing or bathing? 0  Doing errands, shopping? 0  Preparing Food and eating ? N  Using the Toilet? N  In the past six months, have you accidently leaked urine? N  Comment Followed by PCP  Do you have problems with loss of bowel control? N  Managing your Medications? N  Managing your Finances? N  Housekeeping or managing your Housekeeping? N    Patient Care Team: Pleas [name], NP as PCP - General (Nurse Practitioner) Debbora Dus, [facility] as Pharmacist (Pharmacist) Warden Fillers, MD as Consulting Physician (Ophthalmology)  Indicate any recent Medical Services you may have received from other than [facility] providers in the past year (date may be approximate).     Assessment:   This is a routine wellness examination for [place].  I connected with  [name] on 04/29/22 by a audio enabled telemedicine application and verified that I am speaking with the correct person using two identifiers.  Patient Location: Home  Provider Location: Office/Clinic  I discussed the limitations of evaluation and management by telemedicine. The patient expressed understanding and agreed to proceed.   Hearing/Vision screen Hearing Screening - Comments:: Patient is able to hear  conversational tones without difficulty.  No issues reported.   Vision Screening - Comments:: Followed by Dr. Lolita Patella to schedule annual exam Wears corrective lenses    Dietary issues and exercise activities discussed: Current Exercise Habits: Home exercise routine, Type of exercise: walking   Goals Addressed             This Visit's Progress    Maintain healthy lifestyle       Healthy diet Stay hydrated Stay active       Depression Screen    04/29/2022    9:39 AM 11/19/2021    9:24 AM 04/25/2021    2:56 PM 11/13/2020    9:07 AM 06/15/2020   12:00 PM 11/02/2018   10:24 AM 08/31/2014   10:26 AM  PHQ 2/9 Scores  PHQ - 2 Score 0 0 0 2 0 0 0  PHQ- 9 Score  1  5 0      Fall Risk    04/29/2022    9:41 AM 11/19/2021    9:24 AM 04/25/2021    2:53 PM 11/13/2020    9:07 AM  [place] in the past year? 0 0 0 0  Number falls in past yr: 0 0 0 0  Injury with Fall? 0 0 0 0  Risk for fall due to : No Fall Risks  No Fall Risks   Follow up Falls evaluation completed;Falls prevention discussed  Falls prevention discussed     FALL RISK PREVENTION PERTAINING TO THE HOME: Home free of loose throw rugs in walkways, pet beds, electrical cords, etc? Yes  Adequate lighting in your home to reduce risk of falls? Yes   ASSISTIVE DEVICES UTILIZED TO PREVENT FALLS: Life alert? No  Use of a cane, walker or w/c? No  Grab bars in the bathroom? Yes  Shower chair or bench in shower? No  Elevated toilet seat or a handicapped toilet? No   TIMED UP AND GO: Was the test  performed? No .   Cognitive Function:        04/29/2022    9:51 AM  6CIT Screen  What Year? 0 points  What month? 0 points  What time? 0 points  Count back from 20 0 points  Months in reverse 0 points  Repeat phrase 0 points  Total Score 0 points    Immunizations Immunization History  Administered Date(s) Administered   Fluad Quad(high Dose 65+) 05/28/2021   Influenza,inj,Quad PF,6+ Mos 05/03/2018    Pneumococcal Conjugate-13 11/08/2019   Pneumococcal Polysaccharide-23 03/31/2018   Td 12/25/2015   Flu Vaccine status: Due, Education has been provided regarding the importance of this vaccine. Advised may receive this vaccine at local pharmacy or Health Dept. Aware to provide a copy of the vaccination record if obtained from local pharmacy or Health Dept. Verbalized acceptance and understanding.  Shingrix Completed?: No.    Education has been provided regarding the importance of this vaccine. Patient has been advised to call insurance company to determine out of pocket expense if they have not yet received this vaccine. Advised may also receive vaccine at local pharmacy or Health Dept. Verbalized acceptance and understanding.  Screening Tests Health Maintenance  Topic Date Due   Diabetic kidney evaluation - Urine ACR  11/13/2021   OPHTHALMOLOGY EXAM  05/21/2022 (Originally 12/06/2020)   INFLUENZA VACCINE  07/27/2022 (Originally 11/26/2021)   Zoster Vaccines- Shingrix (1 of 2) 07/29/2022 (Originally 03/21/1970)   HEMOGLOBIN A1C  05/22/2022   Diabetic kidney evaluation - eGFR measurement  11/20/2022   FOOT EXAM  11/20/2022   Medicare Annual Wellness (AWV)  04/30/2023   DTaP/Tdap/Td (2 - Tdap) 12/24/2025   COLONOSCOPY (Pts 45-84yr Insurance coverage will need to be confirmed)  12/01/2028   Pneumonia Vaccine 70 Years old  Completed   Hepatitis C Screening  Completed   HPV VACCINES  Aged Out   COVID-19 Vaccine  Discontinued   Health Maintenance Health Maintenance Due  Topic Date Due   Diabetic kidney evaluation - Urine ACR  11/13/2021   Lung Cancer Screening: (Low Dose CT Chest recommended if Age 70-80years, 30 pack-year currently smoking OR have quit w/in 15years.) does not qualify.   Hepatitis C Screening: Completed 08/2017.  Vision Screening: Recommended annual ophthalmology exams for early detection of glaucoma and other disorders of the eye.  Dental Screening: Recommended annual  dental exams for proper oral hygiene  Community Resource Referral / Chronic Care Management: CRR required this visit?  No   CCM required this visit?  No      Plan:     I have personally reviewed and noted the following in the patient's chart:   Medical and social history Use of alcohol, tobacco or illicit drugs  Current medications and supplements including opioid prescriptions. Patient is not currently taking opioid prescriptions. Functional ability and status Nutritional status Physical activity Advanced directives List of other physicians Hospitalizations, surgeries, and ER visits in previous 12 months Vitals Screenings to include cognitive, depression, and falls Referrals and appointments  In addition, I have reviewed and discussed with patient certain preventive protocols, quality metrics, and best practice recommendations. A written personalized care plan for preventive services as well as general preventive health recommendations were provided to patient.     DLeta Jungling LPN   04/02/2034

## 2022-04-30 ENCOUNTER — Encounter: Payer: Self-pay | Admitting: Emergency Medicine

## 2022-05-22 ENCOUNTER — Encounter: Payer: Self-pay | Admitting: Primary Care

## 2022-05-22 ENCOUNTER — Ambulatory Visit (INDEPENDENT_AMBULATORY_CARE_PROVIDER_SITE_OTHER): Payer: Medicare Other | Admitting: Primary Care

## 2022-05-22 VITALS — BP 146/76 | HR 64 | Temp 97.2°F | Ht 65.0 in | Wt 261.0 lb

## 2022-05-22 DIAGNOSIS — J019 Acute sinusitis, unspecified: Secondary | ICD-10-CM | POA: Diagnosis not present

## 2022-05-22 DIAGNOSIS — I10 Essential (primary) hypertension: Secondary | ICD-10-CM

## 2022-05-22 DIAGNOSIS — E1165 Type 2 diabetes mellitus with hyperglycemia: Secondary | ICD-10-CM

## 2022-05-22 HISTORY — DX: Acute sinusitis, unspecified: J01.90

## 2022-05-22 LAB — POCT GLYCOSYLATED HEMOGLOBIN (HGB A1C): Hemoglobin A1C: 6.8 % — AB (ref 4.0–5.6)

## 2022-05-22 MED ORDER — AMOXICILLIN-POT CLAVULANATE 875-125 MG PO TABS: 1.0000 | ORAL_TABLET | Freq: Two times a day (BID) | ORAL | 0 refills | Status: DC

## 2022-05-22 NOTE — Progress Notes (Signed)
Subjective:    Patient ID: Eric Hill, male    DOB: [birth year], 72 y.o.   MRN: [id number]  HPI  Eric Hill is a very pleasant 72 y.o. male with a history of type 2 diabetes, hypertension, morbid obesity, palpitations, depression who presents today for follow up of diabetes and to discuss sinus pressure.   Current medications include: glipizide XL 10 mg daily  He is checking his blood glucose 1 times daily and is getting readings of:  AM fasting: 160's-180's 2 hours later: 130's  Last A1C: 6.9 in July 2023, 6.8 today Last Eye Exam: Due Last Foot Exam: UTD Pneumonia Vaccination: 2021 Urine Microalbumin: Due Statin: atorvastatin   Dietary changes since last visit: Reducing sodium, increased vegetables. Drinking little caffeine.    Exercise: Active.  He is checking BP at home which is running 120's/60's.   BP Readings from Last 3 Encounters:  05/22/22 (!) 146/76  12/27/21 130/74  11/20/21 112/72   Wt Readings from Last 3 Encounters:  05/22/22 261 lb (118.4 kg)  04/29/22 262 lb (118.8 kg)  12/27/21 262 lb (118.8 kg)   2) Sinus Pressure: Acute for the last 3 weeks, mostly located to the frontal lobes. Also with cough, chest congestion, fatigue.  He's been taking Mucinex, vitamin C with little improvement. Overall he's some better but cannot get "rid of this mess".   He has a history of sinusitis, occurs 1-2 times annually, has to take Amoxicillin.   Review of Systems  Constitutional:  Negative for fever.  HENT:  Positive for congestion, sinus pressure and sinus pain.   Respiratory:  Positive for cough. Negative for shortness of breath.   Cardiovascular:  Negative for chest pain.  Neurological:  Positive for headaches.         Past Medical History:  Diagnosis Date   Allergy    seasonal   Cataract    no surgery yet   COVID-19 virus infection 06/15/2020   Depression    Diverticulitis    Erectile dysfunction    History of depression    History  of IBS    has abd pain   Hyperlipidemia    Hypertension    Type 2 diabetes mellitus (HCC)     Social History   Socioeconomic History   Marital status: Single    Spouse name: Not on file   Number of children: Not on file   Years of education: Not on file   Highest education level: Not on file  Occupational History   Not on file  Tobacco Use   Smoking status: Former    Types: Cigarettes    Quit date: 11/18/1990    Years since quitting: 31.5   Smokeless tobacco: Never  Substance and Sexual Activity   Alcohol use: Not Currently    Comment: rarely   Drug use: No   Sexual activity: Not on file  Other Topics Concern   Not on file  Social History Narrative   Single   Works as a Mudlogger.   No children   Lives in [place]   Enjoys racing cars, Designer, fashion/clothing.   Social Determinants of Health   Financial Resource Strain: Low Risk  (04/29/2022)   Overall Financial Resource Strain (CARDIA)    Difficulty of Paying Living Expenses: Not hard at all  Food Insecurity: No Food Insecurity (04/29/2022)   Hunger Vital Sign    Worried About Running Out of Food in the Last Year: Never true  Ran Out of Food in the Last Year: Never true  Transportation Needs: No Transportation Needs (04/29/2022)   PRAPARE - Hydrologist (Medical): No    Lack of Transportation (Non-Medical): No  Physical Activity: Inactive (04/25/2021)   Exercise Vital Sign    Days of Exercise per Week: 0 days    Minutes of Exercise per Session: 0 min  Stress: No Stress Concern Present (04/29/2022)   [place]    Feeling of Stress : Not at all  Social Connections: Socially Isolated (04/29/2022)   Social Connection and Isolation Panel [NHANES]    Frequency of Communication with Friends and Family: Three times a week    Frequency of Social Gatherings with Friends and Family: Three times a week    Attends Religious  Services: Never    Active Member of Clubs or Organizations: No    Attends Archivist Meetings: Never    Marital Status: Never married  Intimate Partner Violence: Not At Risk (04/29/2022)   Humiliation, Afraid, Rape, and Kick questionnaire    Fear of Current or Ex-Partner: No    Emotionally Abused: No    Physically Abused: No    Sexually Abused: No    Past Surgical History:  Procedure Laterality Date   COLONOSCOPY     NO PAST SURGERIES      Family History  Problem Relation Age of Onset   Neuropathy Mother    Cancer Sister        Back, brain   Cancer Maternal Grandfather        Abdominal    Stomach cancer Maternal Grandfather    Colon cancer Neg Hx    Esophageal cancer Neg Hx    Rectal cancer Neg Hx     No Known Allergies  Current Outpatient Medications on File Prior to Visit  Medication Sig Dispense Refill   atorvastatin (LIPITOR) 20 MG tablet TAKE 1 TABLET BY MOUTH EVERY DAY FOR CHOLESTEROL 90 tablet 2   blood glucose meter kit and supplies KIT Dispense based on patient and insurance preference. Use up to 2-3 times daily as directed. Dx is E11.9 1 each 0   cetirizine (ZYRTEC) 10 MG tablet Take 1 tablet (10 mg total) by mouth daily. For cough and drainage. 90 tablet 0   cholecalciferol (VITAMIN D3) 25 MCG (1000 UNIT) tablet Take 1,000 Units by mouth daily.     glipiZIDE (GLUCOTROL XL) 10 MG 24 hr tablet TAKE 1 TABLET BY MOUTH IN THE MORNING WITH FOOD FOR DIABETES 90 tablet 1   metoprolol succinate (TOPROL XL) 25 MG 24 hr tablet Take 0.5 tablets (12.5 mg total) by mouth 2 (two) times daily. 180 tablet 2   olmesartan (BENICAR) 20 MG tablet TAKE 1 TABLET BY MOUTH EVERY DAY FOR BLOOD PRESSURE 90 tablet 3   omeprazole (PRILOSEC) 20 MG capsule TAKE 1 CAPSULE (20 MG TOTAL) BY MOUTH DAILY. FOR COUGH AND HEARTBURN. 90 capsule 0   psyllium (METAMUCIL) 58.6 % powder Take 1 packet by mouth as needed.     sertraline (ZOLOFT) 100 MG tablet TAKE 1 TABLET BY MOUTH DAILY FOR ANXIETY  AND DEPRESSION 90 tablet 3   tamsulosin (FLOMAX) 0.4 MG CAPS capsule TAKE 2 CAPSULES BY MOUTH DAILY. FOR URINE FLOW 180 capsule 2   Vitamin D, Ergocalciferol, (DRISDOL) 1.25 MG (50000 UNIT) CAPS capsule Take 1 capsule by mouth once weekly for vitamin D (Patient not taking: Reported on 05/22/2022) 12 capsule  0   No current facility-administered medications on file prior to visit.    BP (!) 146/76   Pulse 64   Temp (!) 97.2 F (36.2 C) (Temporal)   Ht '5\' 5"'$  (1.651 m)   Wt 261 lb (118.4 kg)   SpO2 97%   BMI 43.43 kg/m  Objective:   Physical Exam Cardiovascular:     Rate and Rhythm: Normal rate and regular rhythm.  Pulmonary:     Effort: Pulmonary effort is normal.     Breath sounds: Normal breath sounds. No wheezing or rales.  Musculoskeletal:     Cervical back: Neck supple.  Skin:    General: Skin is warm and dry.  Neurological:     Mental Status: He is alert and oriented to person, place, and time.           Assessment & Plan:  Type 2 diabetes mellitus with hyperglycemia, without long-term current use of insulin (HCC) Assessment & Plan: Controlled with A1C of 6.8 today.  Continue glipizide XL 10 mg daily.  Urine microalbumin pending. Foot exam UTD.    Orders: -     Microalbumin / creatinine urine ratio -     POCT glycosylated hemoglobin (Hb A1C)  Acute non-recurrent sinusitis, unspecified location Assessment & Plan: Given duration of symptoms, coupled with history, will treat for presumed bacterial involvement.  Start Augmentin antibiotics for the infection Take 1 tablet by mouth twice daily for 7 days.  Discussed Flonase.    Orders: -     Amoxicillin-Pot Clavulanate; Take 1 tablet by mouth 2 (two) times daily.  Dispense: 14 tablet; Refill: 0  Essential hypertension Assessment & Plan: Above goal today, home readings are at goal. No changes made.  Continue olmesartan 20 mg daily and metoprolol succinate 12.5 mg BID.         Pleas [name],  NP

## 2022-05-22 NOTE — Assessment & Plan Note (Signed)
Given duration of symptoms, coupled with history, will treat for presumed bacterial involvement.  Start Augmentin antibiotics for the infection Take 1 tablet by mouth twice daily for 7 days.  Discussed Flonase.

## 2022-05-22 NOTE — Assessment & Plan Note (Signed)
Controlled with A1C of 6.8 today.  Continue glipizide XL 10 mg daily.  Urine microalbumin pending. Foot exam UTD.

## 2022-05-22 NOTE — Addendum Note (Signed)
Addended by: Ellamae Sia on: 05/22/2022 11:13 AM   Modules accepted: Orders

## 2022-05-22 NOTE — Patient Instructions (Signed)
Start Augmentin antibiotics for the infection Take 1 tablet by mouth twice daily for 7 days.  Please schedule a follow up visit for 6 months for your physical.  It was a pleasure to see you today!

## 2022-05-22 NOTE — Assessment & Plan Note (Signed)
Above goal today, home readings are at goal. No changes made.  Continue olmesartan 20 mg daily and metoprolol succinate 12.5 mg BID.

## 2022-06-01 IMAGING — CT CT ANGIO CHEST
2 of 8 series · 10 of 36 positions shown · IV contrast (iopamidol)
Comparison: None.

CLINICAL DATA: Chest pain with inspiration, elevated D-dimer

EXAM:
CT ANGIOGRAPHY CHEST WITH CONTRAST
TECHNIQUE: Multidetector CT imaging of the chest was performed using the
standard protocol during bolus administration of intravenous
contrast. Multiplanar CT image reconstructions and MIPs were
obtained to evaluate the vascular anatomy.
CONTRAST:  75mL [id number] IOPAMIDOL ([id number]) INJECTION 76%

[Series 8: cta pulmonary 2.00 bv36 s3 · coronal · 0.61mm/px · 1 of 203 slices shown]
[im 102/203  mediastinal]
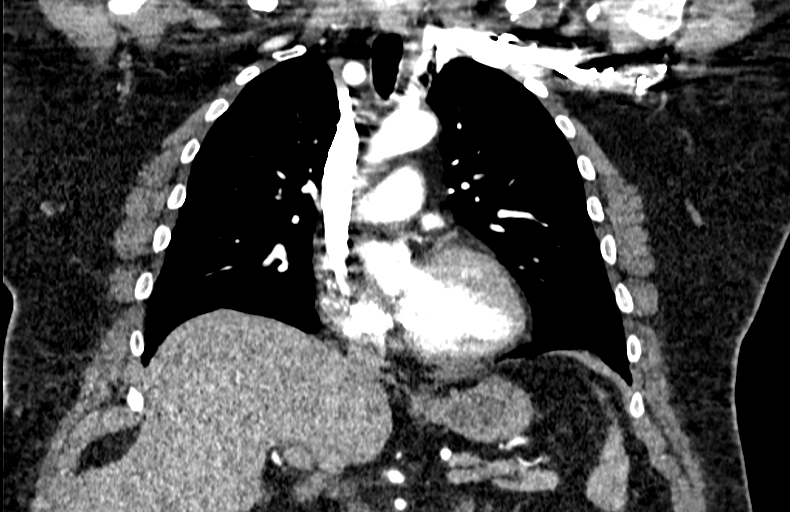

[Series 13: cta pulmonary 1.00 bv36 s3 super d. · axial · 0.90mm/px · z∈[+1602,+1851]mm · 9 of 391 slices shown]
[im 40/391  lung]
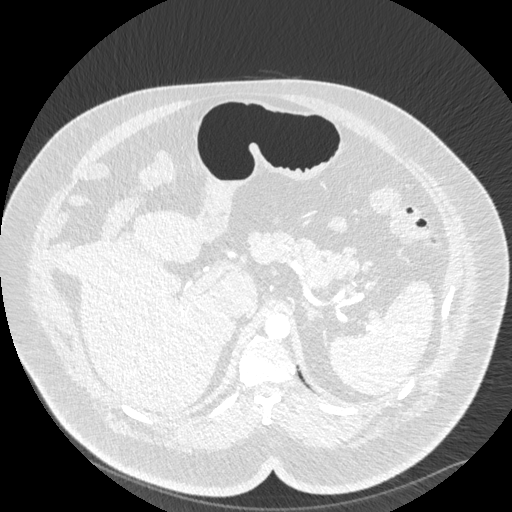
[im 79/391  mediastinal]
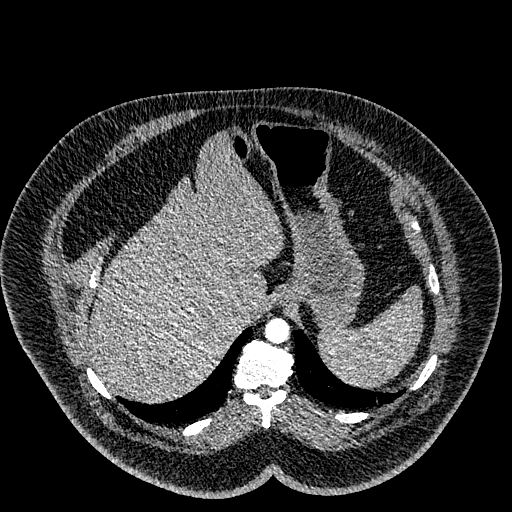
[im 118/391  lung]
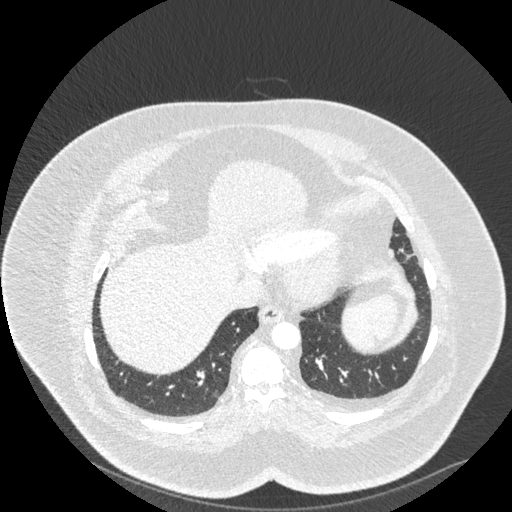
[im 157/391  mediastinal]
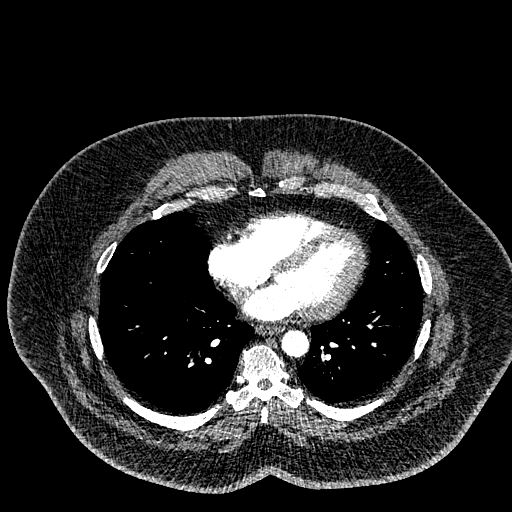
[im 196/391  lung]
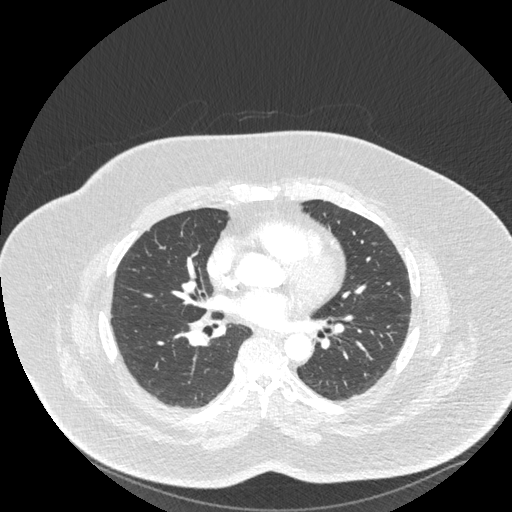
[im 235/391  mediastinal]
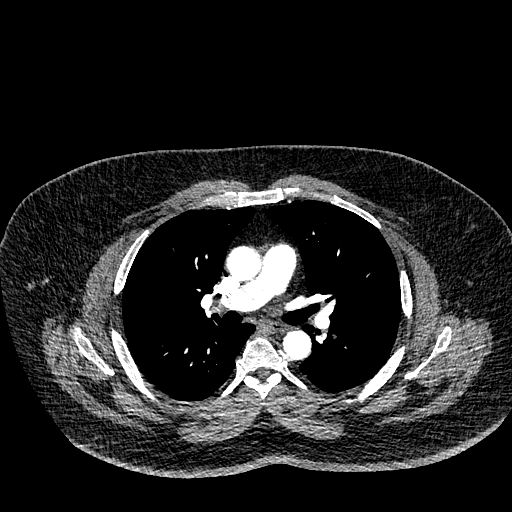
[im 274/391  lung]
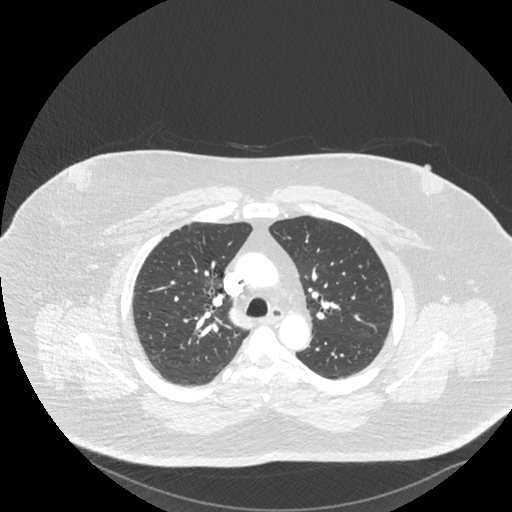
[im 313/391  mediastinal]
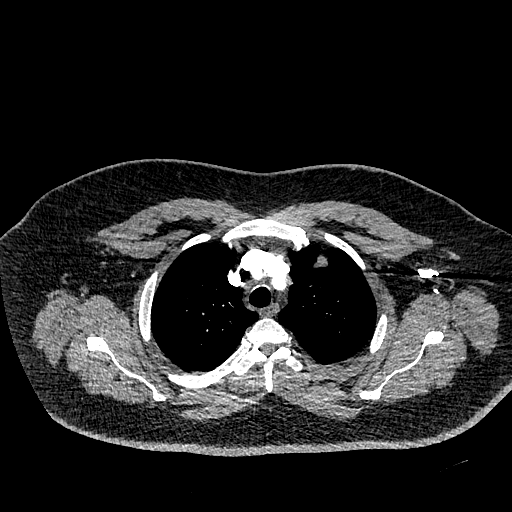
[im 352/391  lung]
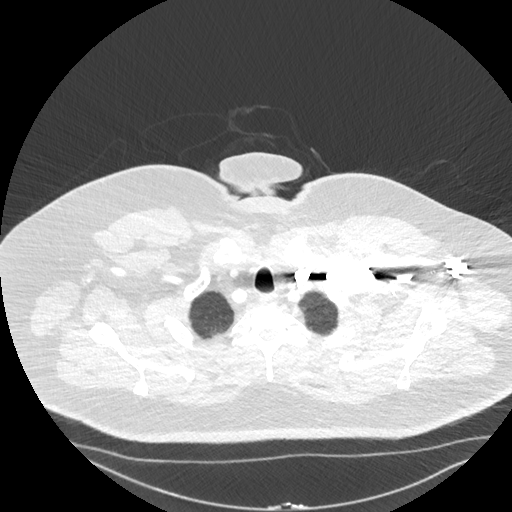

[10 of 36 positions shown; findings below may reference images not displayed]

FINDINGS: Cardiovascular: Satisfactory opacification of the pulmonary arteries
to the segmental level. No evidence of pulmonary embolism. Normal
heart size. Mild coronary artery calcification. No pericardial
effusion.

Mediastinum/Nodes: No enlarged lymph nodes identified. Thyroid is
partially obscured by artifact but appears unremarkable. Esophagus
is unremarkable.

Lungs/Pleura: No pleural effusion or pneumothorax. Minimal
atelectasis/scarring at the lung bases. No consolidation or mass.

Upper Abdomen: No acute abnormality.

Musculoskeletal: No acute osseous abnormality.

Review of the MIP images confirms the above findings.
IMPRESSION: No acute pulmonary embolism or other acute abnormality.

## 2022-07-07 ENCOUNTER — Ambulatory Visit: Payer: Medicare Other | Admitting: Cardiovascular Disease

## 2022-07-14 ENCOUNTER — Other Ambulatory Visit: Payer: Self-pay | Admitting: Primary Care

## 2022-07-14 DIAGNOSIS — E119 Type 2 diabetes mellitus without complications: Secondary | ICD-10-CM

## 2022-09-29 ENCOUNTER — Telehealth: Payer: Self-pay | Admitting: Primary Care

## 2022-09-29 DIAGNOSIS — E1165 Type 2 diabetes mellitus with hyperglycemia: Secondary | ICD-10-CM

## 2022-09-29 DIAGNOSIS — H409 Unspecified glaucoma: Secondary | ICD-10-CM

## 2022-09-29 NOTE — Telephone Encounter (Signed)
That's fine.  Which location? [place] or [place]?

## 2022-09-29 NOTE — Telephone Encounter (Signed)
Pt called stating his Eye doctor has retired & he is in need of a new provider. Pt states he has glaucoma. Pt states all the offices he's contacted requires a referral. Pt is asking if [facility] will submit a referral for him? Call back # [phone]

## 2022-09-29 NOTE — Addendum Note (Signed)
Addended by: Doreene Nest on: 09/29/2022 08:11 PM   Modules accepted: Orders

## 2022-09-30 NOTE — Telephone Encounter (Signed)
Unable to reach patient. Left voicemail to return call to our office.   

## 2022-09-30 NOTE — Telephone Encounter (Signed)
Noted  Referral placed.

## 2022-09-30 NOTE — Addendum Note (Signed)
Addended by: Doreene Nest on: 09/30/2022 04:03 PM   Modules accepted: Orders

## 2022-09-30 NOTE — Telephone Encounter (Signed)
Spoke to pt, pt states he prefers to see someone in [place]. Call back # [phone]

## 2022-11-05 ENCOUNTER — Other Ambulatory Visit: Payer: Self-pay | Admitting: Primary Care

## 2022-11-05 DIAGNOSIS — E119 Type 2 diabetes mellitus without complications: Secondary | ICD-10-CM

## 2022-11-05 DIAGNOSIS — R3915 Urgency of urination: Secondary | ICD-10-CM

## 2022-11-16 ENCOUNTER — Other Ambulatory Visit: Payer: Self-pay | Admitting: Primary Care

## 2022-11-16 DIAGNOSIS — F320 Major depressive disorder, single episode, mild: Secondary | ICD-10-CM

## 2022-11-20 ENCOUNTER — Other Ambulatory Visit: Payer: Self-pay | Admitting: Primary Care

## 2022-11-20 ENCOUNTER — Ambulatory Visit (INDEPENDENT_AMBULATORY_CARE_PROVIDER_SITE_OTHER): Payer: Medicare Other | Admitting: Primary Care

## 2022-11-20 ENCOUNTER — Encounter: Payer: Self-pay | Admitting: Primary Care

## 2022-11-20 VITALS — BP 132/74 | HR 66 | Temp 97.7°F | Ht 65.0 in | Wt 253.0 lb

## 2022-11-20 DIAGNOSIS — R3912 Poor urinary stream: Secondary | ICD-10-CM

## 2022-11-20 DIAGNOSIS — E1165 Type 2 diabetes mellitus with hyperglycemia: Secondary | ICD-10-CM

## 2022-11-20 DIAGNOSIS — K219 Gastro-esophageal reflux disease without esophagitis: Secondary | ICD-10-CM

## 2022-11-20 DIAGNOSIS — R002 Palpitations: Secondary | ICD-10-CM

## 2022-11-20 DIAGNOSIS — E559 Vitamin D deficiency, unspecified: Secondary | ICD-10-CM

## 2022-11-20 DIAGNOSIS — R053 Chronic cough: Secondary | ICD-10-CM

## 2022-11-20 DIAGNOSIS — Z0000 Encounter for general adult medical examination without abnormal findings: Secondary | ICD-10-CM

## 2022-11-20 DIAGNOSIS — Z125 Encounter for screening for malignant neoplasm of prostate: Secondary | ICD-10-CM | POA: Diagnosis not present

## 2022-11-20 DIAGNOSIS — E785 Hyperlipidemia, unspecified: Secondary | ICD-10-CM | POA: Diagnosis not present

## 2022-11-20 DIAGNOSIS — I10 Essential (primary) hypertension: Secondary | ICD-10-CM

## 2022-11-20 DIAGNOSIS — F320 Major depressive disorder, single episode, mild: Secondary | ICD-10-CM

## 2022-11-20 LAB — LIPID PANEL
Cholesterol: 212 mg/dL — ABNORMAL HIGH (ref 0–200)
HDL: 45.6 mg/dL (ref >39.00)
NonHDL: 166.62
Total CHOL/HDL Ratio: 5
Triglycerides: 210 mg/dL — ABNORMAL HIGH (ref 0.0–149.0)
VLDL: 42 mg/dL — ABNORMAL HIGH (ref 0.0–40.0)

## 2022-11-20 LAB — COMPREHENSIVE METABOLIC PANEL
ALT: 16 U/L (ref 0–53)
AST: 19 U/L (ref 0–37)
Albumin: 4.3 g/dL (ref 3.5–5.2)
Alkaline Phosphatase: 80 U/L (ref 39–117)
BUN: 25 mg/dL — ABNORMAL HIGH (ref 6–23)
CO2: 29 mEq/L (ref 19–32)
Calcium: 9.8 mg/dL (ref 8.4–10.5)
Chloride: 102 mEq/L (ref 96–112)
Creatinine, Ser: 0.75 mg/dL (ref 0.40–1.50)
GFR: 91.97 mL/min (ref >60.00)
Glucose, Bld: 126 mg/dL — ABNORMAL HIGH (ref 70–99)
Potassium: 4.5 mEq/L (ref 3.5–5.1)
Sodium: 139 mEq/L (ref 135–145)
Total Bilirubin: 0.6 mg/dL (ref 0.2–1.2)
Total Protein: 7.2 g/dL (ref 6.0–8.3)

## 2022-11-20 LAB — LDL CHOLESTEROL, DIRECT: Direct LDL: 133 mg/dL

## 2022-11-20 LAB — VITAMIN D 25 HYDROXY (VIT D DEFICIENCY, FRACTURES): VITD: 14.39 ng/mL — ABNORMAL LOW (ref 30.00–100.00)

## 2022-11-20 LAB — HEMOGLOBIN A1C: Hgb A1c MFr Bld: 6.9 % — ABNORMAL HIGH (ref 4.6–6.5)

## 2022-11-20 LAB — PSA, MEDICARE: PSA: 0.91 ng/ml (ref 0.10–4.00)

## 2022-11-20 MED ORDER — VITAMIN D (ERGOCALCIFEROL) 1.25 MG (50000 UNIT) PO CAPS
ORAL_CAPSULE | ORAL | 1 refills | Status: DC
Start: 2022-11-20 — End: 2023-11-24

## 2022-11-20 NOTE — Assessment & Plan Note (Signed)
Controlled.  Continue sertraline 100 mg daily.   

## 2022-11-20 NOTE — Progress Notes (Signed)
Subjective:    Patient ID: Eric Hill, male    DOB: October 29, 1950, 72 y.o.   MRN: [id number]  HPI  Eric Hill is a very pleasant 72 y.o. male who presents today for complete physical and follow up of chronic conditions.  Immunizations: -Tetanus: Completed in 2017 -Shingles: Never completed  -Pneumonia: Completed Prevnar 13 in 2021 and Pneumovax 23 in 2019  Diet: Fair diet.  Exercise: No regular exercise.  Eye exam: Completes annually, is scheduled for August Dental exam: Completed last year   Colonoscopy: Completed in 2020, due 2030  PSA: Due  BP Readings from Last 3 Encounters:  11/20/22 132/74  05/22/22 (!) 146/76  12/27/21 130/74    Wt Readings from Last 3 Encounters:  11/20/22 253 lb (114.8 kg)  05/22/22 261 lb (118.4 kg)  04/29/22 262 lb (118.8 kg)     Review of Systems  Constitutional:  Negative for unexpected weight change.  HENT:  Negative for rhinorrhea.   Respiratory:  Negative for cough and shortness of breath.   Cardiovascular:  Negative for chest pain.  Gastrointestinal:  Negative for constipation and diarrhea.  Genitourinary:  Negative for difficulty urinating.  Musculoskeletal:  Negative for arthralgias.  Skin:  Negative for rash.  Allergic/Immunologic: Negative for environmental allergies.  Neurological:  Negative for dizziness and headaches.  Psychiatric/Behavioral:  The patient is not nervous/anxious.          Past Medical History:  Diagnosis Date   Acute non-recurrent sinusitis 05/22/2022   Allergy    seasonal   Cataract    no surgery yet   COVID-19 virus infection 06/15/2020   Depression    Diverticulitis    Erectile dysfunction    Foul smelling urine 11/19/2021   History of depression    History of IBS    has abd pain   Hyperlipidemia    Hypertension    Type 2 diabetes mellitus (HCC)     Social History   Socioeconomic History   Marital status: Single    Spouse name: Not on file   Number of children: Not  on file   Years of education: Not on file   Highest education level: Not on file  Occupational History   Not on file  Tobacco Use   Smoking status: Former    Current packs/day: 0.00    Types: Cigarettes    Quit date: 11/18/1990    Years since quitting: 32.0   Smokeless tobacco: Never  Substance and Sexual Activity   Alcohol use: Not Currently    Comment: rarely   Drug use: No   Sexual activity: Not on file  Other Topics Concern   Not on file  Social History Narrative   Single   Works as a Hospital doctor.   No children   Lives in [place]   Enjoys racing cars, Photographer.   Social Determinants of Health   Financial Resource Strain: Low Risk  (04/29/2022)   Overall Financial Resource Strain (CARDIA)    Difficulty of Paying Living Expenses: Not hard at all  Food Insecurity: No Food Insecurity (04/29/2022)   Hunger Vital Sign    Worried About Running Out of Food in the Last Year: Never true    Ran Out of Food in the Last Year: Never true  Transportation Needs: No Transportation Needs (04/29/2022)   PRAPARE - Administrator, Civil Service (Medical): No    Lack of Transportation (Non-Medical): No  Physical Activity: Inactive (04/25/2021)   Exercise  Vital Sign    Days of Exercise per Week: 0 days    Minutes of Exercise per Session: 0 min  Stress: No Stress Concern Present (04/29/2022)   [employer] of Occupational Health - Occupational Stress Questionnaire    Feeling of Stress : Not at all  Social Connections: Socially Isolated (04/29/2022)   Social Connection and Isolation Panel [NHANES]    Frequency of Communication with Friends and Family: Three times a week    Frequency of Social Gatherings with Friends and Family: Three times a week    Attends Religious Services: Never    Active Member of Clubs or Organizations: No    Attends Banker Meetings: Never    Marital Status: Never married  Intimate Partner Violence: Not At Risk (04/29/2022)    Humiliation, Afraid, Rape, and Kick questionnaire    Fear of Current or Ex-Partner: No    Emotionally Abused: No    Physically Abused: No    Sexually Abused: No    Past Surgical History:  Procedure Laterality Date   COLONOSCOPY     NO PAST SURGERIES      Family History  Problem Relation Age of Onset   Neuropathy Mother    Cancer Sister        Back, brain   Cancer Maternal Grandfather        Abdominal    Stomach cancer Maternal Grandfather    Colon cancer Neg Hx    Esophageal cancer Neg Hx    Rectal cancer Neg Hx     No Known Allergies  Current Outpatient Medications on File Prior to Visit  Medication Sig Dispense Refill   atorvastatin (LIPITOR) 20 MG tablet TAKE 1 TABLET BY MOUTH EVERY DAY FOR CHOLESTEROL 90 tablet 0   blood glucose meter kit and supplies KIT Dispense based on patient and insurance preference. Use up to 2-3 times daily as directed. Dx is E11.9 1 each 0   cholecalciferol (VITAMIN D3) 25 MCG (1000 UNIT) tablet Take 1,000 Units by mouth daily.     glipiZIDE (GLUCOTROL XL) 10 MG 24 hr tablet TAKE 1 TABLET BY MOUTH IN THE MORNING WITH FOOD FOR DIABETES 90 tablet 1   metoprolol succinate (TOPROL XL) 25 MG 24 hr tablet Take 0.5 tablets (12.5 mg total) by mouth 2 (two) times daily. 180 tablet 2   olmesartan (BENICAR) 20 MG tablet TAKE 1 TABLET BY MOUTH EVERY DAY FOR BLOOD PRESSURE 90 tablet 3   sertraline (ZOLOFT) 100 MG tablet TAKE 1 TABLET BY MOUTH DAILY FOR ANXIETY AND DEPRESSION 90 tablet 0   tamsulosin (FLOMAX) 0.4 MG CAPS capsule TAKE 2 CAPSULES BY MOUTH DAILY. FOR URINE FLOW 180 capsule 0   cetirizine (ZYRTEC) 10 MG tablet Take 1 tablet (10 mg total) by mouth daily. For cough and drainage. (Patient not taking: Reported on 11/20/2022) 90 tablet 0   psyllium (METAMUCIL) 58.6 % powder Take 1 packet by mouth as needed. (Patient not taking: Reported on 11/20/2022)     No current facility-administered medications on file prior to visit.    BP 132/74   Pulse 66    Temp 97.7 F (36.5 C) (Temporal)   Ht 5\' 5"  (1.651 m)   Wt 253 lb (114.8 kg)   SpO2 98%   BMI 42.10 kg/m  Objective:   Physical Exam HENT:     Right Ear: Tympanic membrane and ear canal normal.     Left Ear: Tympanic membrane and ear canal normal.     Nose:  Nose normal.     Right Sinus: No maxillary sinus tenderness or frontal sinus tenderness.     Left Sinus: No maxillary sinus tenderness or frontal sinus tenderness.  Eyes:     Conjunctiva/sclera: Conjunctivae normal.  Neck:     Thyroid: No thyromegaly.     Vascular: No carotid bruit.  Cardiovascular:     Rate and Rhythm: Normal rate and regular rhythm.     Heart sounds: Normal heart sounds.  Pulmonary:     Effort: Pulmonary effort is normal.     Breath sounds: Normal breath sounds. No wheezing or rales.  Abdominal:     General: Bowel sounds are normal.     Palpations: Abdomen is soft.     Tenderness: There is no abdominal tenderness.  Musculoskeletal:        General: Normal range of motion.     Cervical back: Neck supple.  Skin:    General: Skin is warm and dry.  Neurological:     Mental Status: He is alert and oriented to person, place, and time.     Cranial Nerves: No cranial nerve deficit.     Deep Tendon Reflexes: Reflexes are normal and symmetric.  Psychiatric:        Mood and Affect: Mood normal.           Assessment & Plan:  Preventative health care Assessment & Plan: Immunizations UTD. Discussed Shingrix vaccines. Colonoscopy UTD, due 2030 PSA due and pending.  Discussed the importance of a healthy diet and regular exercise in order for weight loss, and to reduce the risk of further co-morbidity.  Exam stable. Labs pending.  Follow up in 1 year for repeat physical.    Gastroesophageal reflux disease, unspecified whether esophagitis present Assessment & Plan: Controlled. Continue off treatment.   Essential hypertension Assessment & Plan: Controlled.  Continue olmesartan 20 mg  daily. CMP pending.  Orders: -     Comprehensive metabolic panel  Type 2 diabetes mellitus with hyperglycemia, without long-term current use of insulin (HCC) Assessment & Plan: Repeat A1C pending.  Continue Glipizide XL 10 mg daily. Offered GLP 1 agonist for diabetes treatment and weight loss, he declines for now but will think about it.  Eye exam is scheduled.   Renal function pending.   Follow up in 3-6 months based on A1C result.   Orders: -     Hemoglobin A1c  Chronic cough Assessment & Plan: Resolved with omeprazole 20 mg. No longer taking.     Current mild episode of major depressive disorder without prior episode [facility]) Assessment & Plan: Controlled.  Continue sertraline 100 mg daily.    Weak urinary stream Assessment & Plan: Controlled.  Continue tamsulosin 0.8 mg daily.    Vitamin D deficiency Assessment & Plan: Repeat vitamin D level pending.  Continue supplemental vitamin D OTC. He does not recall the dose.  Orders: -     VITAMIN D 25 Hydroxy (Vit-D Deficiency, Fractures)  Palpitations Assessment & Plan: Controlled.  Continue metoprolol tartrate 12.5 mg BID. Reviewed cardiology notes from September 2023.  He declines referral for sleep study.   Hyperlipidemia, unspecified hyperlipidemia type Assessment & Plan: Continue atorvastatin 20 mg daily. Repeat lipid panel pending.  Orders: -     Lipid panel  Screening for prostate cancer -     PSA, Medicare        Doreene Nest, NP

## 2022-11-20 NOTE — Assessment & Plan Note (Signed)
Controlled.  Continue olmesartan 20 mg daily CMP pending.

## 2022-11-20 NOTE — Assessment & Plan Note (Signed)
Controlled. Continue off treatment.

## 2022-11-20 NOTE — Assessment & Plan Note (Signed)
Continue atorvastatin 20 mg daily. Repeat lipid panel pending.

## 2022-11-20 NOTE — Patient Instructions (Signed)
Stop by the lab prior to leaving today. I will notify you of your results once received.   Please schedule a follow up visit for 6 months for a diabetes check.  It was a pleasure to see you today!   

## 2022-11-20 NOTE — Assessment & Plan Note (Signed)
Immunizations UTD. Discussed Shingrix vaccines. Colonoscopy UTD, due 2030 PSA due and pending.  Discussed the importance of a healthy diet and regular exercise in order for weight loss, and to reduce the risk of further co-morbidity.  Exam stable. Labs pending.  Follow up in 1 year for repeat physical.

## 2022-11-20 NOTE — Assessment & Plan Note (Addendum)
Repeat A1C pending.  Continue Glipizide XL 10 mg daily. Offered GLP 1 agonist for diabetes treatment and weight loss, he declines for now but will think about it.  Eye exam is scheduled.   Renal function pending.   Follow up in 3-6 months based on A1C result.

## 2022-11-20 NOTE — Assessment & Plan Note (Signed)
Controlled.  Continue metoprolol tartrate 12.5 mg BID. Reviewed cardiology notes from September 2023.  He declines referral for sleep study.

## 2022-11-20 NOTE — Assessment & Plan Note (Signed)
Resolved with omeprazole 20 mg. No longer taking.

## 2022-11-20 NOTE — Assessment & Plan Note (Signed)
Controlled.  Continue tamsulosin 0.8 mg daily.

## 2022-11-20 NOTE — Assessment & Plan Note (Signed)
Repeat vitamin D level pending.  Continue supplemental vitamin D OTC. He does not recall the dose.

## 2022-11-25 ENCOUNTER — Encounter: Payer: Self-pay | Admitting: Primary Care

## 2022-11-28 ENCOUNTER — Other Ambulatory Visit: Payer: Self-pay | Admitting: Primary Care

## 2022-11-28 DIAGNOSIS — I10 Essential (primary) hypertension: Secondary | ICD-10-CM

## 2022-12-01 ENCOUNTER — Telehealth: Payer: Self-pay | Admitting: Primary Care

## 2022-12-01 NOTE — Telephone Encounter (Signed)
Patient returned call regarding his lab results. I relayed message to him. He said that he take the cholesterol pills sometimes but not all the time. He said that he would go to the pharmacy to pick up his cholesterol and vitamin D pills.

## 2022-12-02 NOTE — Telephone Encounter (Signed)
Noted  

## 2022-12-16 ENCOUNTER — Encounter: Payer: Self-pay | Admitting: Primary Care

## 2022-12-16 DIAGNOSIS — H40013 Open angle with borderline findings, low risk, bilateral: Secondary | ICD-10-CM | POA: Diagnosis not present

## 2022-12-16 DIAGNOSIS — E119 Type 2 diabetes mellitus without complications: Secondary | ICD-10-CM | POA: Diagnosis not present

## 2022-12-16 DIAGNOSIS — H0102B Squamous blepharitis left eye, upper and lower eyelids: Secondary | ICD-10-CM | POA: Diagnosis not present

## 2022-12-16 DIAGNOSIS — H2513 Age-related nuclear cataract, bilateral: Secondary | ICD-10-CM | POA: Diagnosis not present

## 2022-12-16 DIAGNOSIS — H524 Presbyopia: Secondary | ICD-10-CM | POA: Diagnosis not present

## 2022-12-16 DIAGNOSIS — H0102A Squamous blepharitis right eye, upper and lower eyelids: Secondary | ICD-10-CM | POA: Diagnosis not present

## 2022-12-16 LAB — HM DIABETES EYE EXAM

## 2023-01-14 ENCOUNTER — Other Ambulatory Visit: Payer: Self-pay | Admitting: Primary Care

## 2023-01-14 DIAGNOSIS — E119 Type 2 diabetes mellitus without complications: Secondary | ICD-10-CM

## 2023-02-03 ENCOUNTER — Other Ambulatory Visit: Payer: Self-pay | Admitting: Primary Care

## 2023-02-03 DIAGNOSIS — R3915 Urgency of urination: Secondary | ICD-10-CM

## 2023-02-12 ENCOUNTER — Other Ambulatory Visit: Payer: Self-pay | Admitting: Primary Care

## 2023-02-12 DIAGNOSIS — F320 Major depressive disorder, single episode, mild: Secondary | ICD-10-CM

## 2023-03-01 ENCOUNTER — Other Ambulatory Visit: Payer: Self-pay | Admitting: Primary Care

## 2023-03-01 DIAGNOSIS — E119 Type 2 diabetes mellitus without complications: Secondary | ICD-10-CM

## 2023-03-13 ENCOUNTER — Other Ambulatory Visit: Payer: Self-pay | Admitting: Adult Health

## 2023-03-30 ENCOUNTER — Telehealth: Payer: Self-pay | Admitting: Primary Care

## 2023-03-30 DIAGNOSIS — E785 Hyperlipidemia, unspecified: Secondary | ICD-10-CM

## 2023-03-30 NOTE — Telephone Encounter (Signed)
Silvena from [facility] called in and stated that the patient is no longer taking atorvastatin (LIPITOR) 20 MG tablet. She stated that he said it was making him tired and have stomach aches.

## 2023-03-31 NOTE — Telephone Encounter (Signed)
Please call patient:  Would he be willing to try a different cholesterol pill called rosuvastatin that may not cause the same effects? If so, which pharmacy?

## 2023-03-31 NOTE — Telephone Encounter (Signed)
Called patient and reviewed all information. Patient verbalized understanding.  Patient is willing to try rousvastatin, please send to [facility] in [place] on [address]  He will keep [place] updated if this new cholesterol pill causes him to feel the same effects.  Will call if any further questions.

## 2023-04-01 MED ORDER — ROSUVASTATIN CALCIUM 5 MG PO TABS
5.0000 mg | ORAL_TABLET | Freq: Every day | ORAL | 1 refills | Status: DC
Start: 2023-04-01 — End: 2023-04-30

## 2023-04-01 NOTE — Telephone Encounter (Signed)
Noted, Rx for rosuvastatin 5 mg sent to pharmacy.

## 2023-04-01 NOTE — Addendum Note (Signed)
Addended by: Doreene Nest on: 04/01/2023 10:03 AM   Modules accepted: Orders

## 2023-04-08 ENCOUNTER — Other Ambulatory Visit: Payer: Self-pay | Admitting: Cardiovascular Disease

## 2023-04-14 ENCOUNTER — Telehealth: Payer: Self-pay | Admitting: Cardiovascular Disease

## 2023-04-14 MED ORDER — METOPROLOL SUCCINATE ER 25 MG PO TB24: 12.5000 mg | ORAL_TABLET | Freq: Two times a day (BID) | ORAL | 0 refills | Status: DC

## 2023-04-14 NOTE — Telephone Encounter (Signed)
Pt's medication was sent to pt's pharmacy as requested. Confirmation received.  °

## 2023-04-14 NOTE — Telephone Encounter (Signed)
*  STAT* If patient is at the pharmacy, call can be transferred to refill team.   1. Which medications need to be refilled? (please list name of each medication and dose if known)   metoprolol succinate (TOPROL-XL) 25 MG 24 hr tablet      4. Which pharmacy/location (including street and city if local pharmacy) is medication to be sent to? [id number] - [place], [place] - [address]     5. Do they need a 30 day or 90 day supply? 90   Pt scheduled for next available 06/04/23

## 2023-04-30 ENCOUNTER — Telehealth: Payer: Self-pay | Admitting: Cardiovascular Disease

## 2023-04-30 ENCOUNTER — Ambulatory Visit: Payer: Medicare Other

## 2023-04-30 VITALS — Ht 65.0 in | Wt 253.0 lb

## 2023-04-30 DIAGNOSIS — E1165 Type 2 diabetes mellitus with hyperglycemia: Secondary | ICD-10-CM | POA: Diagnosis not present

## 2023-04-30 DIAGNOSIS — Z0000 Encounter for general adult medical examination without abnormal findings: Secondary | ICD-10-CM | POA: Diagnosis not present

## 2023-04-30 DIAGNOSIS — F320 Major depressive disorder, single episode, mild: Secondary | ICD-10-CM | POA: Diagnosis not present

## 2023-04-30 MED ORDER — SERTRALINE HCL 100 MG PO TABS
100.0000 mg | ORAL_TABLET | Freq: Every day | ORAL | 0 refills | Status: DC
Start: 2023-04-30 — End: 2023-05-26

## 2023-04-30 NOTE — Patient Instructions (Signed)
 Eric Hill , Thank you for taking time to come for your Medicare Wellness Visit. I appreciate your ongoing commitment to your health goals. Please review the following plan we discussed and let me know if I can assist you in the future.   Referrals/Orders/Follow-Ups/Clinician Recommendations: none  This is a list of the screening recommended for you and due dates:  Health Maintenance  Topic Date Due   Zoster (Shingles) Vaccine (1 of 2) Never done   Yearly kidney health urinalysis for diabetes  11/13/2021   Complete foot exam   11/20/2022   Flu Shot  11/27/2022   Hemoglobin A1C  05/23/2023   Yearly kidney function blood test for diabetes  11/20/2023   Eye exam for diabetics  12/16/2023   Medicare Annual Wellness Visit  04/29/2024   DTaP/Tdap/Td vaccine (2 - Tdap) 12/24/2025   Colon Cancer Screening  12/01/2028   Pneumonia Vaccine  Completed   Hepatitis C Screening  Completed   HPV Vaccine  Aged Out   COVID-19 Vaccine  Discontinued    Advanced directives: (Declined) Advance directive discussed with you today. Even though you declined this today, please call our office should you change your mind, and we can give you the proper paperwork for you to fill out.  Next Medicare Annual Wellness Visit scheduled for next year: Yes 05/02/24 @ 11:30am televisit

## 2023-04-30 NOTE — Progress Notes (Signed)
 Subjective:   Eric Hill is a 73 y.o. male who presents for Medicare Annual/Subsequent preventive examination.  Visit Complete: Virtual I connected with  Ozell JAYSON Reef on 04/30/23 by a audio enabled telemedicine application and verified that I am speaking with the correct person using two identifiers.  Patient Location: Home  Provider Location: Office/Clinic  I discussed the limitations of evaluation and management by telemedicine. The patient expressed understanding and agreed to proceed.  Vital Signs: Because this visit was a virtual/telehealth visit, some criteria may be missing or patient reported. Any vitals not documented were not able to be obtained and vitals that have been documented are patient reported.  Patient Medicare AWV questionnaire was completed by the patient on (not done); I have confirmed that all information answered by patient is correct and no changes since this date.  Cardiac Risk Factors include: advanced age (>27men, >12 women);diabetes mellitus;dyslipidemia;male gender;hypertension;obesity (BMI >30kg/m2);sedentary lifestyle    Objective:    Today's Vitals   04/30/23 1140  Weight: 253 lb (114.8 kg)  Height: 5' 5 (1.651 m)   Body mass index is 42.1 kg/m.     04/30/2023   12:03 PM 04/25/2021    2:50 PM  Advanced Directives  Does Patient Have a Medical Advance Directive? No No  Would patient like information on creating a medical advance directive?  Yes (MAU/Ambulatory/Procedural Areas - Information given)    Current Medications (verified) Outpatient Encounter Medications as of 04/30/2023  Medication Sig   blood glucose meter kit and supplies KIT Dispense based on patient and insurance preference. Use up to 2-3 times daily as directed. Dx is E11.9   glipiZIDE  (GLUCOTROL  XL) 10 MG 24 hr tablet TAKE 1 TABLET BY MOUTH IN THE MORNING WITH FOOD FOR DIABETES   metoprolol  succinate (TOPROL -XL) 25 MG 24 hr tablet Take 0.5 tablets (12.5 mg total)  by mouth 2 (two) times daily.   olmesartan  (BENICAR ) 20 MG tablet TAKE 1 TABLET BY MOUTH EVERY DAY FOR BLOOD PRESSURE   sertraline  (ZOLOFT ) 100 MG tablet TAKE 1 TABLET BY MOUTH DAILY FOR ANXIETY AND DEPRESSION   tamsulosin  (FLOMAX ) 0.4 MG CAPS capsule TAKE 2 CAPSULES BY MOUTH DAILY. FOR URINE FLOW   Vitamin D , Ergocalciferol , (DRISDOL ) 1.25 MG (50000 UNIT) CAPS capsule Take 1 capsule by mouth once weekly for vitamin D .   cetirizine  (ZYRTEC ) 10 MG tablet Take 1 tablet (10 mg total) by mouth daily. For cough and drainage. (Patient not taking: Reported on 04/30/2023)   psyllium (METAMUCIL) 58.6 % powder Take 1 packet by mouth as needed. (Patient not taking: Reported on 04/30/2023)   [DISCONTINUED] rosuvastatin  (CRESTOR ) 5 MG tablet Take 1 tablet (5 mg total) by mouth daily. for cholesterol.   No facility-administered encounter medications on file as of 04/30/2023.    Allergies (verified) Rosuvastatin    History: Past Medical History:  Diagnosis Date   Acute non-recurrent sinusitis 05/22/2022   Allergy    seasonal   Cataract    no surgery yet   COVID-19 virus infection 06/15/2020   Depression    Diverticulitis    Erectile dysfunction    Foul smelling urine 11/19/2021   History of depression    History of IBS    has abd pain   Hyperlipidemia    Hypertension    Type 2 diabetes mellitus (HCC)    Past Surgical History:  Procedure Laterality Date   COLONOSCOPY     NO PAST SURGERIES     Family History  Problem Relation Age of  Onset   Neuropathy Mother    Cancer Sister        Back, brain   Cancer Maternal Grandfather        Abdominal    Stomach cancer Maternal Grandfather    Colon cancer Neg Hx    Esophageal cancer Neg Hx    Rectal cancer Neg Hx    Social History   Socioeconomic History   Marital status: Single    Spouse name: Not on file   Number of children: Not on file   Years of education: Not on file   Highest education level: Not on file  Occupational History   Not on  file  Tobacco Use   Smoking status: Former    Current packs/day: 0.00    Types: Cigarettes    Quit date: 11/18/1990    Years since quitting: 32.4   Smokeless tobacco: Never  Substance and Sexual Activity   Alcohol use: Not Currently    Comment: rarely   Drug use: No   Sexual activity: Not on file  Other Topics Concern   Not on file  Social History Narrative   Single   Works as a hospital doctor.   No children   Lives in [place]   Enjoys racing cars, photographer.   Social Drivers of Corporate Investment Banker Strain: Low Risk  (04/30/2023)   Overall Financial Resource Strain (CARDIA)    Difficulty of Paying Living Expenses: Not hard at all  Food Insecurity: No Food Insecurity (04/30/2023)   Hunger Vital Sign    Worried About Running Out of Food in the Last Year: Never true    Ran Out of Food in the Last Year: Never true  Transportation Needs: No Transportation Needs (04/30/2023)   PRAPARE - Administrator, Civil Service (Medical): No    Lack of Transportation (Non-Medical): No  Physical Activity: Inactive (04/30/2023)   Exercise Vital Sign    Days of Exercise per Week: 0 days    Minutes of Exercise per Session: 0 min  Stress: No Stress Concern Present (04/30/2023)   [employer] of Occupational Health - Occupational Stress Questionnaire    Feeling of Stress : Not at all  Social Connections: Socially Isolated (04/30/2023)   Social Connection and Isolation Panel [NHANES]    Frequency of Communication with Friends and Family: Three times a week    Frequency of Social Gatherings with Friends and Family: Once a week    Attends Religious Services: Never    Database Administrator or Organizations: No    Attends Engineer, Structural: Never    Marital Status: Never married    Tobacco Counseling Counseling given: Not Answered  Clinical Intake:  Pre-visit preparation completed: Yes  Pain : No/denies pain    BMI - recorded: 42.1 Nutritional  Status: BMI > 30  Obese Nutritional Risks: None Diabetes: Yes CBG done?: Yes (175 this am at home) CBG resulted in Enter/ Edit results?: No Did pt. bring in CBG monitor from home?: No  How often do you need to have someone help you when you read instructions, pamphlets, or other written materials from your doctor or pharmacy?: 1 - Never  Interpreter Needed?: No  Comments: lives alone Information entered by :: B.Cristobal Advani,LPN  Activities of Daily Living    04/30/2023   12:03 PM  In your present state of health, do you have any difficulty performing the following activities:  Hearing? 0  Vision? 0  Difficulty concentrating  or making decisions? 0  Walking or climbing stairs? 0  Dressing or bathing? 0  Doing errands, shopping? 0  Preparing Food and eating ? N  Using the Toilet? N  In the past six months, have you accidently leaked urine? Y  Do you have problems with loss of bowel control? N  Managing your Medications? N  Managing your Finances? N  Housekeeping or managing your Housekeeping? N    Patient Care Team: Gretta Comer POUR, NP as PCP - General (Nurse Practitioner) Myra Rosaline FALCON, [facility] (Inactive) as Pharmacist (Pharmacist) Octavia Bruckner, MD as Consulting Physician (Ophthalmology)  Indicate any recent Medical Services you may have received from other than [facility] providers in the past year (date may be approximate).     Assessment:   This is a routine wellness examination for Nakul.  Hearing/Vision screen Hearing Screening - Comments:: Pt says his hearing is good Vision Screening - Comments:: Pt says his hearing is good Dr Med[place] Octavia   Goals Addressed             This Visit's Progress    Maintain healthy lifestyle       04/30/23 continue these goals: Healthy diet Stay hydrated Stay active     COMPLETED: Patient Stated   On track    Would like to drink more water.     Pharmacy Care Plan   On track    CARE PLAN ENTRY (see longitudinal plan of  care for additional care plan information)  Current Barriers:  Chronic Disease Management support, education, and care coordination needs related to Hypertension, Diabetes, and Urinary Urgency   Hypertension BP Readings from Last 3 Encounters:  11/08/19 132/82  10/25/19 (!) 144/77  05/05/19 131/68  Pharmacist Clinical Goal(s): Over the next 30 days, patient will work with PharmD and providers to achieve BP goal <140/90 mmHg Current regimen:  Lisinopril  20 mg - 1 tablet daily Metoprolol  tartrate 25 mg - 1 tablet twice daily Interventions: Reviewed blood pressure log and discussed proper technique for checking blood pressure.  Reviewed patient's medication list. Patient self care activities - Over the next 30 days, patient will: Recommend patient resume biking 3 times weekly now that temperatures have improved.  Continue to check blood pressure twice daily, document, and provide at future appointment for review Ensure daily salt intake < 2300 mg/day  Cardiovascular Risk Reduction Lab Results  Component Value Date/Time   LDLCALC 83 11/01/2018 08:51 AM   LDLDIRECT 100.0 11/03/2019 09:33 AM  Pharmacist Clinical Goal(s): Over the next 30 days, patient will work with PharmD and providers to improve medication side effects and reduce cardiovascular risk  Current regimen:  Atorvastatin  20 mg - 1 tablet daily Interventions: Recommend discontinuing atorvastatin  due to intolerance (fatigue, nausea). Restore vitamin D  levels and then consider starting pravastatin to reduce cardiovascular risk. Patient self care activities - Over the next 30 days, patient will: Discontinue atorvastatin   Continue vitamin D  once weekly   Diabetes Lab Results  Component Value Date/Time   HGBA1C 6.6 (H) 11/03/2019 09:33 AM   HGBA1C 6.2 11/01/2018 08:51 AM  Fasting home blood glucose readings: 160s before breakfast, 118-135 before lunch and supper Pharmacist Clinical Goal(s): Over the next 30 days, patient  will work with PharmD and providers to maintain A1c goal <7% and ensure no hypoglycemia (BG < 70 mg/dL) Current regimen:  Glipizide  10 mg ER - 1 tablet daily before first meal of the day Interventions: Recommend eating a high protein and fiber snack before bedtime to prevent  overnight hypoglycemia, such as yogurt with no sugar added or cheese and nuts Reviewed blood sugar readings. Discussed elevated blood sugars recently. Will follow-up in 2 weeks to determine if improving with diet/exercise.  Patient self care activities - Over the next 30 days, patient will: Check blood sugar daily first thing in the morning before eating or drinking, document, and provide at future appointment Contact provider with any episodes of hypoglycemia Review labels on snacks and choose options with 15-20 grams of carbs with high protein and fiber  Urinary Urgency Pharmacist Clinical Goal(s) Over the next 30 days, patient will work with PharmD and providers to improve urinary symptoms including weak stream Current regimen:  Tamsulosin  0.4 mg - 1 capsule daily with breakfast Interventions: Discussed patient reports 2 capsule daily dose did not improve symptoms and he didn't feel well so he resumed daily.  Patient self care activities - Over the next 30 days, patient will: Continue tamsulosin  0.4 mg daily Monitor for symptoms improvement over the next 2-3 weeks  Medication management Pharmacist Clinical Goal(s): Over the next 30 days, patient will work with PharmD and providers to achieve optimal medication adherence Current pharmacy: [facility] Interventions Comprehensive medication review performed. Utilize [facility] pharmacy for medication synchronization, packaging and delivery Patient self care activities - Over the next 30 days, patient will: Coordinate with pharmacist for to have medications synchronized Call Rosaline with any questions or concerns Continue to take medications as prescribed  Please  see past updates related to this goal by clicking on the Past Updates button in the selected goal        Depression Screen    04/30/2023   11:54 AM 11/20/2022   10:15 AM 04/29/2022    9:39 AM 11/19/2021    9:24 AM 04/25/2021    2:56 PM 11/13/2020    9:07 AM 06/15/2020   12:00 PM  PHQ 2/9 Scores  PHQ - 2 Score 0 0 0 0 0 2 0  PHQ- 9 Score  0  1  5 0    Fall Risk    04/30/2023   11:46 AM 11/20/2022   10:15 AM 04/29/2022    9:41 AM 11/19/2021    9:24 AM 04/25/2021    2:53 PM  Fall Risk   Falls in the past year? 0 0 0 0 0  Number falls in past yr: 0 0 0 0 0  Injury with Fall? 0 0 0 0 0  Risk for fall due to : No Fall Risks No Fall Risks No Fall Risks  No Fall Risks  Follow up Education provided;Falls prevention discussed Falls evaluation completed Falls evaluation completed;Falls prevention discussed  Falls prevention discussed    MEDICARE RISK AT HOME: Medicare Risk at Home Any stairs in or around the home?: No If so, are there any without handrails?: No Home free of loose throw rugs in walkways, pet beds, electrical cords, etc?: Yes Adequate lighting in your home to reduce risk of falls?: Yes Life alert?: No Use of a cane, walker or w/c?: Yes (sometimes) Grab bars in the bathroom?: Yes Shower chair or bench in shower?: No Elevated toilet seat or a handicapped toilet?: No  TIMED UP AND GO:  Was the test performed?  No    Cognitive Function:        04/30/2023   12:05 PM 04/29/2022    9:51 AM  6CIT Screen  What Year? 0 points 0 points  What month? 0 points 0 points  What time? 0 points 0  points  Count back from 20 0 points 0 points  Months in reverse 0 points 0 points  Repeat phrase 0 points 0 points  Total Score 0 points 0 points    Immunizations Immunization History  Administered Date(s) Administered   Fluad Quad(high Dose 65+) 05/28/2021   Influenza,inj,Quad PF,6+ Mos 05/03/2018   Pneumococcal Conjugate-13 11/08/2019   Pneumococcal Polysaccharide-23 03/31/2018    Td 12/25/2015    TDAP status: Up to date  Flu Vaccine status: Due, Education has been provided regarding the importance of this vaccine. Advised may receive this vaccine at local pharmacy or Health Dept. Aware to provide a copy of the vaccination record if obtained from local pharmacy or Health Dept. Verbalized acceptance and understanding.  Pneumococcal vaccine status: Up to date  Covid-19 vaccine status: Declined, Education has been provided regarding the importance of this vaccine but patient still declined. Advised may receive this vaccine at local pharmacy or Health Dept.or vaccine clinic. Aware to provide a copy of the vaccination record if obtained from local pharmacy or Health Dept. Verbalized acceptance and understanding.  Qualifies for Shingles Vaccine? Yes   Zostavax completed No   Shingrix Completed?: No.    Education has been provided regarding the importance of this vaccine. Patient has been advised to call insurance company to determine out of pocket expense if they have not yet received this vaccine. Advised may also receive vaccine at local pharmacy or Health Dept. Verbalized acceptance and understanding.  Screening Tests Health Maintenance  Topic Date Due   Diabetic kidney evaluation - Urine ACR  11/13/2021   FOOT EXAM  05/31/2023 (Originally 11/20/2022)   INFLUENZA VACCINE  07/27/2023 (Originally 11/27/2022)   Zoster Vaccines- Shingrix (1 of 2) 07/29/2023 (Originally 03/21/1970)   HEMOGLOBIN A1C  05/23/2023   Diabetic kidney evaluation - eGFR measurement  11/20/2023   OPHTHALMOLOGY EXAM  12/16/2023   Medicare Annual Wellness (AWV)  04/29/2024   DTaP/Tdap/Td (2 - Tdap) 12/24/2025   Colonoscopy  12/01/2028   Pneumonia Vaccine 90+ Years old  Completed   Hepatitis C Screening  Completed   HPV VACCINES  Aged Out   COVID-19 Vaccine  Discontinued    Health Maintenance  Health Maintenance Due  Topic Date Due   Diabetic kidney evaluation - Urine ACR  11/13/2021     Colorectal cancer screening: Type of screening: Colonoscopy. Completed 12/02/2018. Repeat every 10 years  Lung Cancer Screening: (Low Dose CT Chest recommended if Age 26-80 years, 20 pack-year currently smoking OR have quit w/in 15years.) does not qualify.   Lung Cancer Screening Referral: no  Additional Screening:  Hepatitis C Screening: does not qualify; Completed 09/22/2017  Vision Screening: Recommended annual ophthalmology exams for early detection of glaucoma and other disorders of the eye. Is the patient up to date with their annual eye exam?  Yes  Who is the provider or what is the name of the office in which the patient attends annual eye exams? Dr Octavia If pt is not established with a provider, would they like to be referred to a provider to establish care? No .   Dental Screening: Recommended annual dental exams for proper oral hygiene  Diabetic Foot Exam: Diabetic Foot Exam: Completed 11/20/22  Community Resource Referral / Chronic Care Management: CRR required this visit?  No   CCM required this visit?  No    Plan:     I have personally reviewed and noted the following in the patient's chart:   Medical and social history Use of alcohol,  tobacco or illicit drugs  Current medications and supplements including opioid prescriptions. Patient is not currently taking opioid prescriptions. Functional ability and status Nutritional status Physical activity Advanced directives List of other physicians Hospitalizations, surgeries, and ER visits in previous 12 months Vitals Screenings to include cognitive, depression, and falls Referrals and appointments  In addition, I have reviewed and discussed with patient certain preventive protocols, quality metrics, and best practice recommendations. A written personalized care plan for preventive services as well as general preventive health recommendations were provided to patient.   Erminio LITTIE Saris, LPN   12/02/7972   After  Visit Summary: (Declined) Due to this being a telephonic visit, with patients personalized plan was offered to patient but patient Declined AVS at this time   Nurse Notes: pt says he will be out of Sertraline  before appt: needs one month refill. Pt also relays he stopped the Rosuvastatin  as it caused very bad aching and muscle soreness and pain. He relays he could hardly get out the bed and when he stopped the medication it went away. This has been noted on pt profile.

## 2023-04-30 NOTE — Telephone Encounter (Signed)
 Patient calling the office for samples of medication:   1.  What medication and dosage are you requesting samples for?  metoprolol succinate (TOPROL-XL) 25 MG 24 hr tablet   2.  Are you currently out of this medication?   No

## 2023-04-30 NOTE — Telephone Encounter (Signed)
 Called and spoke to patient. Verified name and DOB. He stated when he picked up his Metoprolol  prescription he only received 15 tablet. Inform patient prescription was sent in for 90 tablets. Advise patient to contact pharmacy for refills. Patient verbalized understanding and agree. Will call back as needed.

## 2023-05-04 ENCOUNTER — Ambulatory Visit: Payer: Self-pay | Admitting: Primary Care

## 2023-05-04 NOTE — Telephone Encounter (Signed)
 noted

## 2023-05-04 NOTE — Telephone Encounter (Signed)
 Chief Complaint: chest congestion Symptoms: productive cough, right ear soreness, nasal congestion, sore throat. Frequency: x 1 week Pertinent Negatives: Patient denies SOB, chest pain Disposition: [] ED /[] Urgent Care (no appt availability in office) / [x] Appointment(In office/virtual)/ []  [place] [facility]/ [] Home Care/ [] Refused Recommended Disposition /[]  Mobile Bus/ []  Follow-up with PCP Additional Notes: Patient states he has been treating his symptoms at home with Muccinex DM and ibuprofen. Patient states he felt his symptoms were most severe on Saturday and Sunday. Patient states he feels improved today but reports he thinks it could be an infection and is concerned he needs antibiotics. Patient denies chest pain/tightness/pressure.  Copied from CRM [id number]. Topic: Clinical - Red Word Triage >> May 04, 2023 10:30 AM Tiffany H wrote: [facility] that prompted transfer to Nurse Triage: Chest Pressure.   Patient called to advise that he suspects he has a sinus infection. He has chest congstion he's been treating with mucinex  but would like some antibiotics. Patient advised that chest pressure is one of his symptoms. Reason for Disposition  Earache  Coughing up rusty-colored (reddish-brown) sputum  Answer Assessment - Initial Assessment Questions 1. LOCATION: Where does it hurt?      Right ear sensitivity and sore and right sinus pain.  2. ONSET: When did the sinus pain start?  (e.g., hours, days)      X 1 week.  3. SEVERITY: How bad is the pain?   (Scale 1-10; mild, moderate or severe)   - MILD (1-3): doesn't interfere with normal activities    - MODERATE (4-7): interferes with normal activities (e.g., work or school) or awakens from sleep   - SEVERE (8-10): excruciating pain and patient unable to do any normal activities        0/10.  4. RECURRENT SYMPTOM: Have you ever had sinus problems before? If Yes, ask: When was the last time? and What happened  that time?      Patient states all my life, used to be every winter I would have to get on an antibiotic.  5. NASAL CONGESTION: Is the nose blocked? If Yes, ask: Can you open it or must you breathe through your mouth?     Patient states nasal congestion has improved but alternates between left or right side being congested.  6. NASAL DISCHARGE: Do you have discharge from your nose? If so ask, What color?     Clear discharge.  7. FEVER: Do you have a fever? If Yes, ask: What is it, how was it measured, and when did it start?      Denies fever for the past 3 days; felt feverish before that.  8. OTHER SYMPTOMS: Do you have any other symptoms? (e.g., sore throat, cough, earache, difficulty breathing)     Cough, right ear sore, sore throat.  Answer Assessment - Initial Assessment Questions 1. ONSET: When did the cough begin?      X 1 week.  2. SEVERITY: How bad is the cough today?      Patient states it was severe yesterday and has improved today. States yesterday was a 10/10 and today 8/10.  3. SPUTUM: Describe the color of your sputum (none, dry cough; clear, white, yellow, green)     Patient states sometimes it is green and sometimes brown.  4. HEMOPTYSIS: Are you coughing up any blood? If so ask: How much? (flecks, streaks, tablespoons, etc.)     Denies blood in sputum.  5. DIFFICULTY BREATHING: Are you having difficulty breathing? If  Yes, ask: How bad is it? (e.g., mild, moderate, severe)    - MILD: No SOB at rest, mild SOB with walking, speaks normally in sentences, can lie down, no retractions, pulse < 100.    - MODERATE: SOB at rest, SOB with minimal exertion and prefers to sit, cannot lie down flat, speaks in phrases, mild retractions, audible wheezing, pulse 100-120.    - SEVERE: Very SOB at rest, speaks in single words, struggling to breathe, sitting hunched forward, retractions, pulse > 120      Patient states Saturday night and Sunday were  severe; patient states at this time he only had to cough once today.  6. FEVER: Do you have a fever? If Yes, ask: What is your temperature, how was it measured, and when did it start?     Felt feverish last week, denies fevers in the past 3 days.  7. CARDIAC HISTORY: Do you have any history of heart disease? (e.g., heart attack, congestive heart failure)      High blood pressure and history of palpitations.  8. LUNG HISTORY: Do you have any history of lung disease?  (e.g., pulmonary embolus, asthma, emphysema)     Denies.  9. PE RISK FACTORS: Do you have a history of blood clots? (or: recent major surgery, recent prolonged travel, bedridden)     Denies.  10. OTHER SYMPTOMS: Do you have any other symptoms? (e.g., runny nose, wheezing, chest pain)       Sinus congestion.  11. TRAVEL: Have you traveled out of the country in the last month? (e.g., travel history, exposures)       Patient states his neighbor's daughter was recently ill and exposed to her.  Protocols used: Sinus Pain or Congestion-A-AH, Cough - Acute Productive-A-AH

## 2023-05-05 ENCOUNTER — Encounter: Payer: Self-pay | Admitting: Family Medicine

## 2023-05-05 ENCOUNTER — Ambulatory Visit (INDEPENDENT_AMBULATORY_CARE_PROVIDER_SITE_OTHER): Payer: Medicare Other | Admitting: Family Medicine

## 2023-05-05 VITALS — BP 130/60 | HR 71 | Temp 98.2°F | Ht 65.0 in | Wt 254.5 lb

## 2023-05-05 DIAGNOSIS — R051 Acute cough: Secondary | ICD-10-CM | POA: Diagnosis not present

## 2023-05-05 DIAGNOSIS — H6993 Unspecified Eustachian tube disorder, bilateral: Secondary | ICD-10-CM | POA: Diagnosis not present

## 2023-05-05 DIAGNOSIS — J019 Acute sinusitis, unspecified: Secondary | ICD-10-CM | POA: Insufficient documentation

## 2023-05-05 LAB — POC INFLUENZA A&B (BINAX/QUICKVUE)
Influenza A, POC: NEGATIVE
Influenza B, POC: NEGATIVE

## 2023-05-05 LAB — POC COVID19 BINAXNOW: SARS Coronavirus 2 Ag: NEGATIVE

## 2023-05-05 MED ORDER — AZITHROMYCIN 250 MG PO TABS: ORAL_TABLET | ORAL | 0 refills | Status: DC

## 2023-05-05 MED ORDER — FLUTICASONE PROPIONATE 50 MCG/ACT NA SUSP: 2.0000 | Freq: Every day | NASAL | 6 refills | Status: DC

## 2023-05-05 MED ORDER — GUAIFENESIN-CODEINE 100-10 MG/5ML PO SOLN: 5.0000 mL | Freq: Every day | ORAL | 0 refills | Status: DC

## 2023-05-05 NOTE — Progress Notes (Signed)
 Patient ID: Eric Hill, male    DOB: Aug 04, 1950, 73 y.o.   MRN: [id number]  This visit was conducted in person.  BP 130/60 (BP Location: Left Arm, Patient Position: Sitting, Cuff Size: Large)   Pulse 71   Temp 98.2 F (36.8 C) (Temporal)   Ht 5' 5 (1.651 m)   Wt 254 lb 8 oz (115.4 kg)   SpO2 95%   BMI 42.35 kg/m    CC:  Chief Complaint  Patient presents with   Cough    With chest tightness-Symptoms started 3 days ago   Ear Pain    Right   Nasal Congestion    With green mucus    Subjective:   HPI: Eric Hill is a 73 y.o. male  patient of Mallie Gaskins, NP with history of DM and HTN presenting on 05/05/2023 for Cough (With chest tightness-Symptoms started 3 days ago), Ear Pain (Right), and Nasal Congestion (With green mucus)   Date of onset: 3 days ago, pt poor historian,  later said possibly ongoing >1 week, and worsening in last 3 days Initial symptoms included  Nasal congestion Symptoms progressed to productive cough  Coughing keeping him up at night  Hearing whistling in chest.  No SOB.  Subjective fever.. sweating.  Right ear fullness  Headache, some frontal pain.     Sick contacts:  friend's daughter with illness COVID testing:   none     He has tried to treat with  Mucinex  DM     No history of chronic lung disease such as asthma or COPD. Former smoker.       Relevant past medical, surgical, family and social history reviewed and updated as indicated. Interim medical history since our last visit reviewed. Allergies and medications reviewed and updated. Outpatient Medications Prior to Visit  Medication Sig Dispense Refill   blood glucose meter kit and supplies KIT Dispense based on patient and insurance preference. Use up to 2-3 times daily as directed. Dx is E11.9 1 each 0   glipiZIDE  (GLUCOTROL  XL) 10 MG 24 hr tablet TAKE 1 TABLET BY MOUTH IN THE MORNING WITH FOOD FOR DIABETES 90 tablet 1   metoprolol  succinate (TOPROL -XL) 25 MG 24 hr  tablet Take 0.5 tablets (12.5 mg total) by mouth 2 (two) times daily. 90 tablet 0   olmesartan  (BENICAR ) 20 MG tablet TAKE 1 TABLET BY MOUTH EVERY DAY FOR BLOOD PRESSURE 90 tablet 3   psyllium (METAMUCIL) 58.6 % powder Take 1 packet by mouth as needed.     sertraline  (ZOLOFT ) 100 MG tablet Take 1 tablet (100 mg total) by mouth daily. 30 tablet 0   tamsulosin  (FLOMAX ) 0.4 MG CAPS capsule TAKE 2 CAPSULES BY MOUTH DAILY. FOR URINE FLOW 180 capsule 2   Vitamin D , Ergocalciferol , (DRISDOL ) 1.25 MG (50000 UNIT) CAPS capsule Take 1 capsule by mouth once weekly for vitamin D . 12 capsule 1   cetirizine  (ZYRTEC ) 10 MG tablet Take 1 tablet (10 mg total) by mouth daily. For cough and drainage. (Patient not taking: Reported on 04/30/2023) 90 tablet 0   No facility-administered medications prior to visit.     Per HPI unless specifically indicated in ROS section below Review of Systems  Constitutional:  Negative for fatigue and fever.  HENT:  Negative for ear pain.   Eyes:  Negative for pain.  Respiratory:  Negative for cough and shortness of breath.   Cardiovascular:  Negative for chest pain, palpitations and leg swelling.  Gastrointestinal:  Negative  for abdominal pain.  Genitourinary:  Negative for dysuria.  Musculoskeletal:  Negative for arthralgias.  Neurological:  Negative for syncope, light-headedness and headaches.  Psychiatric/Behavioral:  Negative for dysphoric mood.    Objective:  BP 130/60 (BP Location: Left Arm, Patient Position: Sitting, Cuff Size: Large)   Pulse 71   Temp 98.2 F (36.8 C) (Temporal)   Ht 5' 5 (1.651 m)   Wt 254 lb 8 oz (115.4 kg)   SpO2 95%   BMI 42.35 kg/m   Wt Readings from Last 3 Encounters:  05/05/23 254 lb 8 oz (115.4 kg)  04/30/23 253 lb (114.8 kg)  11/20/22 253 lb (114.8 kg)      Physical Exam Vitals reviewed.  Constitutional:      Appearance: He is well-developed.  HENT:     Head: Normocephalic.     Right Ear: Hearing normal. A middle ear effusion  is present.     Left Ear: Hearing normal. A middle ear effusion is present.     Nose: Nose normal.  Neck:     Thyroid : No thyroid  mass or thyromegaly.     Vascular: No carotid bruit.     Trachea: Trachea normal.  Cardiovascular:     Rate and Rhythm: Normal rate and regular rhythm.     Pulses: Normal pulses.     Heart sounds: Heart sounds not distant. No murmur heard.    No friction rub. No gallop.     Comments: No peripheral edema Pulmonary:     Effort: Pulmonary effort is normal. No respiratory distress.     Breath sounds: Normal breath sounds.     Comments: Deep breaths cause wheezy cough, uncontrollable Skin:    General: Skin is warm and dry.     Findings: No rash.  Psychiatric:        Speech: Speech normal.        Behavior: Behavior normal.        Thought Content: Thought content normal.       Results for orders placed or performed in visit on 12/16/22  HM DIABETES EYE EXAM   Collection Time: 12/16/22  3:32 PM  Result Value Ref Range   HM Diabetic Eye Exam No Retinopathy No Retinopathy    Assessment and Plan  Acute cough Assessment & Plan: Acute, concern for initial viral infection now with bacterial superinfection. Negative COVID and flu testing in office today.  Push fluids, rest.  Complete course of antibiotics.  Start flonase  2 sprays per nostril daily for fluid behind ear drums.  Can use cough suppressant at night as needed .  Orders: -     POC COVID-19 BinaxNow -     POC Influenza A&B(BINAX/QUICKVUE)  ETD (Eustachian tube dysfunction), bilateral  Other orders -     Azithromycin ; 2 tab po x 1 day then 1 tab po daily  Dispense: 6 tablet; Refill: 0 -     Fluticasone  Propionate; Place 2 sprays into both nostrils daily.  Dispense: 16 g; Refill: 6 -     guaiFENesin -Codeine ; Take 5-10 mLs by mouth at bedtime.  Dispense: 100 mL; Refill: 0    No follow-ups on file.   Greig Ring, MD

## 2023-05-05 NOTE — Patient Instructions (Signed)
 Push fluids, rest.  Complete course of antibiotics.  Start flonase 2 sprays per nostril daily for fluid behind ear drums.  Can use cough suppressant at night as needed .

## 2023-05-05 NOTE — Assessment & Plan Note (Signed)
 Acute, concern for initial viral infection now with bacterial superinfection. Negative COVID and flu testing in office today.  Push fluids, rest.  Complete course of antibiotics.  Start flonase  2 sprays per nostril daily for fluid behind ear drums.  Can use cough suppressant at night as needed .

## 2023-05-26 ENCOUNTER — Ambulatory Visit (INDEPENDENT_AMBULATORY_CARE_PROVIDER_SITE_OTHER): Payer: Medicare Other | Admitting: Primary Care

## 2023-05-26 ENCOUNTER — Encounter: Payer: Self-pay | Admitting: Primary Care

## 2023-05-26 VITALS — BP 132/76 | HR 68 | Temp 97.2°F | Ht 65.0 in | Wt 258.0 lb

## 2023-05-26 DIAGNOSIS — J019 Acute sinusitis, unspecified: Secondary | ICD-10-CM

## 2023-05-26 DIAGNOSIS — Z7984 Long term (current) use of oral hypoglycemic drugs: Secondary | ICD-10-CM

## 2023-05-26 DIAGNOSIS — F320 Major depressive disorder, single episode, mild: Secondary | ICD-10-CM

## 2023-05-26 DIAGNOSIS — E1165 Type 2 diabetes mellitus with hyperglycemia: Secondary | ICD-10-CM

## 2023-05-26 LAB — POCT GLYCOSYLATED HEMOGLOBIN (HGB A1C): Hemoglobin A1C: 6.7 % — AB (ref 4.0–5.6)

## 2023-05-26 LAB — MICROALBUMIN / CREATININE URINE RATIO
Creatinine,U: 97.2 mg/dL
Microalb Creat Ratio: 0.7 mg/g (ref 0.0–30.0)
Microalb, Ur: <0.7 mg/dL (ref 0.0–1.9)

## 2023-05-26 MED ORDER — SERTRALINE HCL 100 MG PO TABS: 100.0000 mg | ORAL_TABLET | Freq: Every day | ORAL | 1 refills | Status: DC

## 2023-05-26 MED ORDER — AMOXICILLIN-POT CLAVULANATE 875-125 MG PO TABS: 1.0000 | ORAL_TABLET | Freq: Two times a day (BID) | ORAL | 0 refills | Status: DC

## 2023-05-26 NOTE — Assessment & Plan Note (Signed)
Given duration of symptoms, coupled with lack of complete resolved with azithromycin, will treat with Augmentin course. He agrees.  Start Augmentin antibiotics. Take 1 tablet by mouth twice daily for 7 days.  Follow-up as needed.

## 2023-05-26 NOTE — Assessment & Plan Note (Signed)
Improved with A1C of 6.7 today.  Continue glipizide XL 10 mg daily  Urine microalbumin due and pending today.  Follow-up in 6 months.

## 2023-05-26 NOTE — Patient Instructions (Signed)
Start Augmentin antibiotics. Take 1 tablet by mouth twice daily for 7 days.  Please schedule a physical to meet with me in 6 months.   It was a pleasure to see you today!

## 2023-05-26 NOTE — Progress Notes (Signed)
Subjective:    Patient ID: Eric Hill, male    DOB: October 05, 1950, 73 y.o.   MRN: [id number]  HPI  Eric Hill is a very pleasant 73 y.o. male with a history of type 2 diabetes, hypertension, vitamin D deficiency, MDD, hyperlipidemia who presents today for follow up of diabetes and sinus pressure.  He is also needing a refill of his sertraline.  1) Type 2 Diabetes: Current medications include: Glipizide XL 10 mg daily  He is checking his blood glucose on occasion and is getting readings of:  AM fasting 160s After dinner: 140s  Last A1C: 6.9 in July 2024, 6.7 today.  Last Eye Exam: UTD Last Foot Exam: UTD Pneumonia Vaccination: 2021 Urine Microalbumin: Due Statin: rosuvastatin   Dietary changes since last visit: Increased intake of fresh fruit.    Exercise: Active at home.   BP Readings from Last 3 Encounters:  05/26/23 132/76  05/05/23 130/60  11/20/22 132/74   2) Sinus Pressure: Acute for the last 1 month. Evaluated by Dr. Ermalene Searing on 05/05/23 for a several day history of sinus pressure and URI symptoms. Treated with azithromycin course for which he completed. He's noticed improvement in symptoms but no resolve. Symptoms now consist of sinus pressure, post nasal drip, productive cough. He's been using Flonase nasal spray.    Review of Systems  Constitutional:  Positive for fatigue.  HENT:  Positive for congestion, postnasal drip, sinus pressure and sinus pain.   Eyes:  Negative for visual disturbance.  Respiratory:  Positive for cough. Negative for shortness of breath.   Cardiovascular:  Negative for chest pain.  Neurological:  Negative for dizziness and numbness.         Past Medical History:  Diagnosis Date   Acute non-recurrent sinusitis 05/22/2022   Allergy    seasonal   Cataract    no surgery yet   COVID-19 virus infection 06/15/2020   Depression    Diverticulitis    Erectile dysfunction    Foul smelling urine 11/19/2021   History of  depression    History of IBS    has abd pain   Hyperlipidemia    Hypertension    Type 2 diabetes mellitus (HCC)     Social History   Socioeconomic History   Marital status: Single    Spouse name: Not on file   Number of children: Not on file   Years of education: Not on file   Highest education level: Not on file  Occupational History   Not on file  Tobacco Use   Smoking status: Former    Current packs/day: 0.00    Types: Cigarettes    Quit date: 11/18/1990    Years since quitting: 32.5   Smokeless tobacco: Never  Substance and Sexual Activity   Alcohol use: Not Currently    Comment: rarely   Drug use: No   Sexual activity: Not on file  Other Topics Concern   Not on file  Social History Narrative   Single   Works as a Hospital doctor.   No children   Lives in [place]   Enjoys racing cars, Photographer.   Social Drivers of Corporate investment banker Strain: Low Risk  (04/30/2023)   Overall Financial Resource Strain (CARDIA)    Difficulty of Paying Living Expenses: Not hard at all  Food Insecurity: No Food Insecurity (04/30/2023)   Hunger Vital Sign    Worried About Running Out of Food in the Last Year: Never  true    Ran Out of Food in the Last Year: Never true  Transportation Needs: No Transportation Needs (04/30/2023)   PRAPARE - Administrator, Civil Service (Medical): No    Lack of Transportation (Non-Medical): No  Physical Activity: Inactive (04/30/2023)   Exercise Vital Sign    Days of Exercise per Week: 0 days    Minutes of Exercise per Session: 0 min  Stress: No Stress Concern Present (04/30/2023)   [employer] of Occupational Health - Occupational Stress Questionnaire    Feeling of Stress : Not at all  Social Connections: Socially Isolated (04/30/2023)   Social Connection and Isolation Panel [NHANES]    Frequency of Communication with Friends and Family: Three times a week    Frequency of Social Gatherings with Friends and Family: Once  a week    Attends Religious Services: Never    Database administrator or Organizations: No    Attends Banker Meetings: Never    Marital Status: Never married  Intimate Partner Violence: Not At Risk (04/30/2023)   Humiliation, Afraid, Rape, and Kick questionnaire    Fear of Current or Ex-Partner: No    Emotionally Abused: No    Physically Abused: No    Sexually Abused: No    Past Surgical History:  Procedure Laterality Date   COLONOSCOPY     NO PAST SURGERIES      Family History  Problem Relation Age of Onset   Neuropathy Mother    Cancer Sister        Back, brain   Cancer Maternal Grandfather        Abdominal    Stomach cancer Maternal Grandfather    Colon cancer Neg Hx    Esophageal cancer Neg Hx    Rectal cancer Neg Hx     Allergies  Allergen Reactions   Rosuvastatin     Very bad aching and muscles aches    Current Outpatient Medications on File Prior to Visit  Medication Sig Dispense Refill   fluticasone (FLONASE) 50 MCG/ACT nasal spray Place 2 sprays into both nostrils daily. 16 g 6   glipiZIDE (GLUCOTROL XL) 10 MG 24 hr tablet TAKE 1 TABLET BY MOUTH IN THE MORNING WITH FOOD FOR DIABETES 90 tablet 1   metoprolol succinate (TOPROL-XL) 25 MG 24 hr tablet Take 0.5 tablets (12.5 mg total) by mouth 2 (two) times daily. 90 tablet 0   olmesartan (BENICAR) 20 MG tablet TAKE 1 TABLET BY MOUTH EVERY DAY FOR BLOOD PRESSURE 90 tablet 3   psyllium (METAMUCIL) 58.6 % powder Take 1 packet by mouth as needed.     tamsulosin (FLOMAX) 0.4 MG CAPS capsule TAKE 2 CAPSULES BY MOUTH DAILY. FOR URINE FLOW 180 capsule 2   Vitamin D, Ergocalciferol, (DRISDOL) 1.25 MG (50000 UNIT) CAPS capsule Take 1 capsule by mouth once weekly for vitamin D. 12 capsule 1   blood glucose meter kit and supplies KIT Dispense based on patient and insurance preference. Use up to 2-3 times daily as directed. Dx is E11.9 (Patient not taking: Reported on 05/26/2023) 1 each 0   guaiFENesin-codeine  100-10 MG/5ML syrup Take 5-10 mLs by mouth at bedtime. (Patient not taking: Reported on 05/26/2023) 100 mL 0   No current facility-administered medications on file prior to visit.    BP 132/76   Pulse 68   Temp (!) 97.2 F (36.2 C) (Temporal)   Ht 5\' 5"  (1.651 m)   Wt 258 lb (117 kg)  SpO2 98%   BMI 42.93 kg/m  Objective:   Physical Exam Cardiovascular:     Rate and Rhythm: Normal rate and regular rhythm.  Pulmonary:     Effort: Pulmonary effort is normal.     Breath sounds: Examination of the right-lower field reveals rhonchi. Examination of the left-lower field reveals rhonchi. Rhonchi present.  Musculoskeletal:     Cervical back: Neck supple.  Skin:    General: Skin is warm and dry.  Neurological:     Mental Status: He is alert and oriented to person, place, and time.  Psychiatric:        Mood and Affect: Mood normal.           Assessment & Plan:  Type 2 diabetes mellitus with hyperglycemia, without long-term current use of insulin (HCC) Assessment & Plan: Improved with A1C of 6.7 today.  Continue glipizide XL 10 mg daily  Urine microalbumin due and pending today.  Follow-up in 6 months.   Orders: -     POCT glycosylated hemoglobin (Hb A1C) -     Microalbumin / creatinine urine ratio  Acute non-recurrent sinusitis, unspecified location Assessment & Plan: Given duration of symptoms, coupled with lack of complete resolved with azithromycin, will treat with Augmentin course. He agrees.  Start Augmentin antibiotics. Take 1 tablet by mouth twice daily for 7 days.  Follow-up as needed.  Orders: -     Amoxicillin-Pot Clavulanate; Take 1 tablet by mouth 2 (two) times daily.  Dispense: 14 tablet; Refill: 0  Current mild episode of major depressive disorder without prior episode (HCC) -     Sertraline HCl; Take 1 tablet (100 mg total) by mouth daily. for anxiety and depression.  Dispense: 90 tablet; Refill: 1        Doreene Nest, NP

## 2023-06-04 ENCOUNTER — Ambulatory Visit: Payer: Medicare Other | Admitting: Cardiovascular Disease

## 2023-06-23 ENCOUNTER — Encounter: Payer: Self-pay | Admitting: Cardiovascular Disease

## 2023-06-23 ENCOUNTER — Ambulatory Visit: Payer: Medicare Other | Attending: Cardiovascular Disease | Admitting: Cardiovascular Disease

## 2023-06-23 VITALS — BP 137/70 | HR 77 | Ht 65.0 in | Wt 254.8 lb

## 2023-06-23 DIAGNOSIS — G4733 Obstructive sleep apnea (adult) (pediatric): Secondary | ICD-10-CM

## 2023-06-23 DIAGNOSIS — I471 Supraventricular tachycardia, unspecified: Secondary | ICD-10-CM | POA: Diagnosis not present

## 2023-06-23 DIAGNOSIS — E1165 Type 2 diabetes mellitus with hyperglycemia: Secondary | ICD-10-CM

## 2023-06-23 DIAGNOSIS — E785 Hyperlipidemia, unspecified: Secondary | ICD-10-CM | POA: Diagnosis not present

## 2023-06-23 DIAGNOSIS — R002 Palpitations: Secondary | ICD-10-CM

## 2023-06-23 DIAGNOSIS — I10 Essential (primary) hypertension: Secondary | ICD-10-CM | POA: Diagnosis not present

## 2023-06-23 MED ORDER — EZETIMIBE 10 MG PO TABS: 10.0000 mg | ORAL_TABLET | Freq: Every day | ORAL | 3 refills | Status: DC

## 2023-06-23 MED ORDER — METOPROLOL SUCCINATE ER 25 MG PO TB24: ORAL_TABLET | ORAL | 3 refills | Status: AC

## 2023-06-23 NOTE — Progress Notes (Signed)
 Cardiology Office Note    Date:  06/28/2023   ID:  Javis, Abboud 1950-11-20, MRN [id number]  PCP:  Doreene Nest, NP  Cardiologist:  Nicki Guadalajara, MD   4-year evaluation, last evaluated by me in a telemedicine evaluation in June 2021.  History of Present Illness:  Eric Hill is a 73 y.o. male who has a history of intermittent palpitations which would often be exacerbated during periods of increased stress.  He had worked as a Curator on trucks.  When I initially saw him in 2019 he was noticing more palpitations over the prior 6 months he denied any associated chest tightness or heaviness.  He was drinking significant amount of caffeine and was sleeping insufficiently only at 5 hours.  He underwent an echo Doppler study in October 2019 which showed normal LV function without significant valvular pathology.  Cardiac monitor showed predominant sinus rhythm with an average rate at 62 with several episodes of sinus tachycardia rates in the 120s as well as occasional isolated PVCs and transient trigeminal rhythm.  There were no pauses or A-fib detected.  When last evaluated during a COVID TeleMed medicine evaluation, he was on metoprolol tartrate 25 mg in the morning and 12.5 mg in the evening, lisinopril 20 mg, atorvastatin 20 mg, as well as glipizide.  He was trying to ride a bicycle for exercise.  I have not evaluated him since 2021.  He has been retired for over 4 years.  He had seen Joni Reining, NP on December 27, 2021.  A monitor showed a dominant sinus rhythm with 3 brief runs of SVT and it was advised that he resume Toprol XL 12.5 mg twice a day. He enjoys drag racing.  He denies recent chest pain or shortness of breath.  He has recently noticed some blood pressure lability where blood pressure may increase up to 150 over the 80s.  Apparently, he remotely had been on a atorvastatin and then later rosuvastatin but due to arthralgias stopped therapy.  Presently, he  is on glipizide 10 mg for diabetes, and is on metoprolol succinate 12.5 mg twice a day and olmesartan 20 mg for hypertension.  He takes tamsulosin for prostate issues.  He takes 50,000 units of vitamin D weekly.  He sees Vernona Rieger, at [facility] office for primary care.  He presents for evaluation.   Past Medical History:  Diagnosis Date   Acute non-recurrent sinusitis 05/22/2022   Allergy    seasonal   Cataract    no surgery yet   COVID-19 virus infection 06/15/2020   Depression    Diverticulitis    Erectile dysfunction    Foul smelling urine 11/19/2021   History of depression    History of IBS    has abd pain   Hyperlipidemia    Hypertension    Type 2 diabetes mellitus (HCC)     Past Surgical History:  Procedure Laterality Date   COLONOSCOPY     NO PAST SURGERIES      Current Medications: Outpatient Medications Prior to Visit  Medication Sig Dispense Refill   blood glucose meter kit and supplies KIT Dispense based on patient and insurance preference. Use up to 2-3 times daily as directed. Dx is E11.9 1 each 0   glipiZIDE (GLUCOTROL XL) 10 MG 24 hr tablet TAKE 1 TABLET BY MOUTH IN THE MORNING WITH FOOD FOR DIABETES 90 tablet 1   olmesartan (BENICAR) 20 MG tablet TAKE 1  TABLET BY MOUTH EVERY DAY FOR BLOOD PRESSURE 90 tablet 3   sertraline (ZOLOFT) 100 MG tablet Take 1 tablet (100 mg total) by mouth daily. for anxiety and depression. 90 tablet 1   tamsulosin (FLOMAX) 0.4 MG CAPS capsule TAKE 2 CAPSULES BY MOUTH DAILY. FOR URINE FLOW 180 capsule 2   Vitamin D, Ergocalciferol, (DRISDOL) 1.25 MG (50000 UNIT) CAPS capsule Take 1 capsule by mouth once weekly for vitamin D. 12 capsule 1   metoprolol succinate (TOPROL-XL) 25 MG 24 hr tablet Take 0.5 tablets (12.5 mg total) by mouth 2 (two) times daily. 90 tablet 0   amoxicillin-clavulanate (AUGMENTIN) 875-125 MG tablet Take 1 tablet by mouth 2 (two) times daily. (Patient not taking: Reported on 06/23/2023) 14 tablet 0    fluticasone (FLONASE) 50 MCG/ACT nasal spray Place 2 sprays into both nostrils daily. (Patient not taking: Reported on 06/23/2023) 16 g 6   guaiFENesin-codeine 100-10 MG/5ML syrup Take 5-10 mLs by mouth at bedtime. (Patient not taking: Reported on 06/23/2023) 100 mL 0   psyllium (METAMUCIL) 58.6 % powder Take 1 packet by mouth as needed. (Patient not taking: Reported on 06/23/2023)     No facility-administered medications prior to visit.     Allergies:   Rosuvastatin   Social History   Socioeconomic History   Marital status: Single    Spouse name: Not on file   Number of children: Not on file   Years of education: Not on file   Highest education level: Not on file  Occupational History   Not on file  Tobacco Use   Smoking status: Former    Current packs/day: 0.00    Types: Cigarettes    Quit date: 11/18/1990    Years since quitting: 32.6   Smokeless tobacco: Never  Substance and Sexual Activity   Alcohol use: Not Currently    Comment: rarely   Drug use: No   Sexual activity: Not on file  Other Topics Concern   Not on file  Social History Narrative   Single   Works as a Hospital doctor.   No children   Lives in [place]   Enjoys racing cars, Photographer.   Social Drivers of Corporate investment banker Strain: Low Risk  (04/30/2023)   Overall Financial Resource Strain (CARDIA)    Difficulty of Paying Living Expenses: Not hard at all  Food Insecurity: No Food Insecurity (04/30/2023)   Hunger Vital Sign    Worried About Running Out of Food in the Last Year: Never true    Ran Out of Food in the Last Year: Never true  Transportation Needs: No Transportation Needs (04/30/2023)   PRAPARE - Administrator, Civil Service (Medical): No    Lack of Transportation (Non-Medical): No  Physical Activity: Inactive (04/30/2023)   Exercise Vital Sign    Days of Exercise per Week: 0 days    Minutes of Exercise per Session: 0 min  Stress: No Stress Concern Present (04/30/2023)    [employer] of Occupational Health - Occupational Stress Questionnaire    Feeling of Stress : Not at all  Social Connections: Socially Isolated (04/30/2023)   Social Connection and Isolation Panel [NHANES]    Frequency of Communication with Friends and Family: Three times a week    Frequency of Social Gatherings with Friends and Family: Once a week    Attends Religious Services: Never    Database administrator or Organizations: No    Attends Banker Meetings:  Never    Marital Status: Never married     Family History:  The patient's family history includes Cancer in his maternal grandfather and sister; Neuropathy in his mother; Stomach cancer in his maternal grandfather.   ROS General: Negative; No fevers, chills, or night sweats; morbid obesity HEENT: Negative; No changes in vision or hearing, sinus congestion, difficulty swallowing Pulmonary: Negative; No cough, wheezing, shortness of breath, hemoptysis Cardiovascular: See HPI GI: Negative; No nausea, vomiting, diarrhea, or abdominal pain GU: Negative; No dysuria, hematuria, or difficulty voiding Musculoskeletal: Negative; no myalgias, joint pain, or weakness Hematologic/Oncology: Negative; no easy bruising, bleeding Endocrine: Negative; no heat/cold intolerance; no diabetes Neuro: Negative; no changes in balance, headaches Skin: Negative; No rashes or skin lesions Psychiatric: Negative; No behavioral problems, depression Sleep: Negative; No snoring, daytime sleepiness, hypersomnolence, bruxism, restless legs, hypnogognic hallucinations, no cataplexy Other comprehensive 14 point system review is negative.   PHYSICAL EXAM:   VS:  BP 137/70   Pulse 77   Ht 5\' 5"  (1.651 m)   Wt 254 lb 12.8 oz (115.6 kg)   SpO2 94%   BMI 42.40 kg/m     Blood pressure by me was 125/70  Wt Readings from Last 3 Encounters:  06/23/23 254 lb 12.8 oz (115.6 kg)  05/26/23 258 lb (117 kg)  05/05/23 254 lb 8 oz (115.4 kg)     General: Alert, oriented, no distress.  Morbid obesity with BMI 42.4. Skin: normal turgor, no rashes, warm and dry HEENT: Normocephalic, atraumatic. Pupils equal round and reactive to light; sclera anicteric; extraocular muscles intact;  Nose without nasal septal hypertrophy Mouth/Parynx benign; Mallinpatti scale Neck: No JVD, no carotid bruits; normal carotid upstroke Lungs: clear to ausculatation and percussion; no wheezing or rales Chest wall: without tenderness to palpitation Heart: PMI not displaced, RRR, s1 s2 normal, 1/6 systolic murmur, no diastolic murmur, no rubs, gallops, thrills, or heaves Abdomen: Central adiposity; diastases recti; soft, nontender; no hepatosplenomehaly, BS+; abdominal aorta nontender and not dilated by palpation. Back: no CVA tenderness Pulses 2+ Musculoskeletal: full range of motion, normal strength, no joint deformities Extremities: no clubbing cyanosis or edema, Homan's sign negative  Neurologic: grossly nonfocal; Cranial nerves grossly wnl Psychologic: Normal mood and affect   Studies/Labs Reviewed:   EKG Interpretation Date/Time:  Tuesday June 23 2023 11:27:54 EST Ventricular Rate:  77 PR Interval:  186 QRS Duration:  88 QT Interval:  386 QTC Calculation: 436 R Axis:   15  Text Interpretation: Normal sinus rhythm Normal ECG No previous ECGs available Confirmed by Nicki Guadalajara ([id number]) on 06/28/2023 11:29:40 AM    Recent Labs:    Latest Ref Rng & Units 11/20/2022   10:38 AM 11/19/2021    9:58 AM 11/13/2020    9:41 AM  BMP  Glucose 70 - 99 mg/dL 621  308  657   BUN 6 - 23 mg/dL 25  19  29    Creatinine 0.40 - 1.50 mg/dL 8.46  9.62  9.52   Sodium 135 - 145 mEq/L 139  139  138   Potassium 3.5 - 5.1 mEq/L 4.5  4.1  4.6   Chloride 96 - 112 mEq/L 102  104  102   CO2 19 - 32 mEq/L 29  24  28    Calcium 8.4 - 10.5 mg/dL 9.8  9.2  9.5         Latest Ref Rng & Units 11/20/2022   10:38 AM 11/19/2021    9:58 AM 11/13/2020    9:41 AM  Hepatic  Function  Total Protein 6.0 - 8.3 g/dL 7.2  7.1  7.0   Albumin 3.5 - 5.2 g/dL 4.3  4.1  4.3   AST 0 - 37 U/L 19  15  22    ALT 0 - 53 U/L 16  14  19    Alk Phosphatase 39 - 117 U/L 80  71  82   Total Bilirubin 0.2 - 1.2 mg/dL 0.6  0.8  0.7        Latest Ref Rng & Units 11/19/2021    9:58 AM 06/15/2020   12:56 PM 11/03/2019    9:33 AM  CBC  WBC 4.0 - 10.5 K/uL 8.8  6.4  7.7   Hemoglobin 13.0 - 17.0 g/dL 09.8  11.9  14.7   Hematocrit 39.0 - 52.0 % 43.2  43.0  43.2   Platelets 150.0 - 400.0 K/uL 236.0  234.0  246.0    Lab Results  Component Value Date   MCV 88.1 11/19/2021   MCV 89.5 06/15/2020   MCV 89.7 11/03/2019   Lab Results  Component Value Date   TSH 2.460 11/20/2021   Lab Results  Component Value Date   HGBA1C 6.7 (A) 05/26/2023     BNP No results found for: "BNP"  ProBNP No results found for: "PROBNP"   Lipid Panel     Component Value Date/Time   CHOL 212 (H) 11/20/2022 1038   TRIG 210.0 (H) 11/20/2022 1038   HDL 45.60 11/20/2022 1038   CHOLHDL 5 11/20/2022 1038   VLDL 42.0 (H) 11/20/2022 1038   LDLCALC 56 11/19/2021 0958   LDLDIRECT 133.0 11/20/2022 1038     RADIOLOGY: No results found.   Additional studies/ records that were reviewed today include:  Prior cardiology evaluations were reviewed   ASSESSMENT:    1. Essential hypertension   2. Palpitations   3. SVT (supraventricular tachycardia) (HCC)   4. Obesity, morbid, BMI 40.0-49.9 (HCC)   5. Hyperlipidemia, unspecified hyperlipidemia type   6. 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  He enjoys drag racing.  Remotely he had smoked but quit in 1992.  He has a history of hypertension and stress mediated palpitations.  A remote cardiac monitor showed rare PACs and PVCs with several episodes of ventricular trigeminy in 2019.  He has had some  blood pressure lability and when last evaluated by me in 2021 was on lisinopril 20 mg and metoprolol tartrate 25 mg in the morning and 12.5 mg in the evening.  At that time I recommended further titration to 25 mg twice a day.  He is diabetic on glipizide and has a history of hyperlipidemia for which she had been on statin therapy.  He has recently noticed some blood pressure lability and on presentation today blood pressure was 137/70.  He apparently has only been taking the metoprolol succinate 12.5 mg twice a day.  I have recommended he increase his morning dose and take 25 mg in the morning and continue with the 12.5 mg at night.  He has recently been on olmesartan for additional blood pressure control.  I discussed ideal blood pressure less than 120/80 with stage I hypertension commencing at 130/80 and stage II hypertension at 140/90.  I discussed for every 20/10 increase there is a potential doubling of cardiovascular risk.  He enjoys racing cars.  BMI is consistent  with morbid obesity and I discussed the importance of weight loss and increased activity.  He had been on atorvastatin and subsequently rosuvastatin but stopped therapy due to arthralgias.  Laboratory in July 2024 showed total cholesterol 212, triglycerides 210, HDL 42, and direct LDL elevated at 133.  With his mixed hyperlipidemia I also recommended an initiation of over-the-counter omega-3 fatty acid initially but if triglycerides remain elevated he should transition to Vascepa 2 capsules twice a day.  I have recommended he initiate a trial of Zetia 10 mg.  He will be seeing his primary physician Jerelyn Charles and repeat laboratory will be obtained.  I am scheduling to see Joni Reining, NP for cardiology reevaluation in 3 to 4 months.  I discussed my plans for retirement later this year.  He will then need to be transitioned to another cardiologist and possibly can be seen closer to his home   Medication Adjustments/Labs and Tests  Ordered: Current medicines are reviewed at length with the patient today.  Concerns regarding medicines are outlined above.  Medication changes, Labs and Tests ordered today are listed in the Patient Instructions below. Patient Instructions  Medication Instructions:  Begin Zetia 10mg  daily.  Take the metoprolol succinate 25mg  in the morning and 12.5mg  (one half tablet) at night.  Purchase fish oil over the counter [employer].   *If you need a refill on your cardiac medications before your next appointment, please call your pharmacy*   Lab Work: Return in 4 months for fasting labs If you have labs (blood work) drawn today and your tests are completely normal, you will receive your results only by: MyChart Message (if you have MyChart) OR A paper copy in the mail If you have any lab test that is abnormal or we need to change your treatment, we will call you to review the results.   Testing/Procedures: No labs were ordered during today's visit.    Follow-Up: At [facility], you and your health needs are our priority.  As part of our continuing mission to provide you with exceptional heart care, we have created designated Provider Care Teams.  These Care Teams include your primary Cardiologist (physician) and Advanced Practice Providers (APPs -  Physician Assistants and Nurse Practitioners) who all work together to provide you with the care you need, when you need it.  We recommend signing up for the patient portal called "MyChart".  Sign up information is provided on this After Visit Summary.  MyChart is used to connect with patients for Virtual Visits (Telemedicine).  Patients are able to view lab/test results, encounter notes, upcoming appointments, etc.  Non-urgent messages can be sent to your provider as well.   To learn more about what you can do with MyChart, go to [email or web address].    Your next appointment:   3-23month(s)  Provider:   Joni Reining, DNP,  ANP        Other Instructions        1st Floor: - Lobby - Registration  - Pharmacy  - Lab - Cafe   2nd Floor: - PV Lab - Diagnostic Testing (echo, CT, nuclear med)   3rd Floor: - Vacant   4th Floor: - TCTS (cardiothoracic surgery) - AFib Clinic - Structural Heart Clinic - Vascular Surgery  - Vascular Ultrasound   5th Floor: - HeartCare Cardiology (general and EP) - Clinical Pharmacy for coumadin, hypertension, lipid, weight-loss medications, and med management appointments      Valet parking services will be available as well.  Signed, Nicki Guadalajara, MD  06/28/2023 11:48 AM    [facility] [address], [place], [place]  [address] Phone: [phone]

## 2023-06-23 NOTE — Patient Instructions (Addendum)
 Medication Instructions:  Begin Zetia 10mg  daily.  Take the metoprolol succinate 25mg  in the morning and 12.5mg  (one half tablet) at night.  Purchase fish oil over the counter [employer].   *If you need a refill on your cardiac medications before your next appointment, please call your pharmacy*   Lab Work: Return in 4 months for fasting labs If you have labs (blood work) drawn today and your tests are completely normal, you will receive your results only by: MyChart Message (if you have MyChart) OR A paper copy in the mail If you have any lab test that is abnormal or we need to change your treatment, we will call you to review the results.   Testing/Procedures: No labs were ordered during today's visit.    Follow-Up: At [facility], you and your health needs are our priority.  As part of our continuing mission to provide you with exceptional heart care, we have created designated Provider Care Teams.  These Care Teams include your primary Cardiologist (physician) and Advanced Practice Providers (APPs -  Physician Assistants and Nurse Practitioners) who all work together to provide you with the care you need, when you need it.  We recommend signing up for the patient portal called "MyChart".  Sign up information is provided on this After Visit Summary.  MyChart is used to connect with patients for Virtual Visits (Telemedicine).  Patients are able to view lab/test results, encounter notes, upcoming appointments, etc.  Non-urgent messages can be sent to your provider as well.   To learn more about what you can do with MyChart, go to [email or web address].    Your next appointment:   3-68month(s)  Provider:   Joni Reining, DNP, ANP        Other Instructions        1st Floor: - Lobby - Registration  - Pharmacy  - Lab - Cafe   2nd Floor: - PV Lab - Diagnostic Testing (echo, CT, nuclear med)   3rd Floor: - Vacant   4th Floor: - TCTS (cardiothoracic  surgery) - AFib Clinic - Structural Heart Clinic - Vascular Surgery  - Vascular Ultrasound   5th Floor: - HeartCare Cardiology (general and EP) - Clinical Pharmacy for coumadin, hypertension, lipid, weight-loss medications, and med management appointments      Valet parking services will be available as well.

## 2023-06-28 ENCOUNTER — Encounter: Payer: Self-pay | Admitting: Cardiovascular Disease

## 2023-07-01 ENCOUNTER — Other Ambulatory Visit: Payer: Self-pay | Admitting: Primary Care

## 2023-07-01 DIAGNOSIS — E119 Type 2 diabetes mellitus without complications: Secondary | ICD-10-CM

## 2023-09-22 ENCOUNTER — Ambulatory Visit: Attending: Nurse Practitioner | Admitting: Nurse Practitioner

## 2023-09-22 ENCOUNTER — Encounter: Payer: Self-pay | Admitting: Nurse Practitioner

## 2023-09-22 ENCOUNTER — Ambulatory Visit: Payer: Medicare Other | Admitting: Adult Health

## 2023-09-22 VITALS — BP 120/64 | HR 70 | Ht 65.0 in | Wt 248.0 lb

## 2023-09-22 DIAGNOSIS — E785 Hyperlipidemia, unspecified: Secondary | ICD-10-CM

## 2023-09-22 DIAGNOSIS — I10 Essential (primary) hypertension: Secondary | ICD-10-CM

## 2023-09-22 DIAGNOSIS — R002 Palpitations: Secondary | ICD-10-CM

## 2023-09-22 DIAGNOSIS — E1165 Type 2 diabetes mellitus with hyperglycemia: Secondary | ICD-10-CM | POA: Diagnosis not present

## 2023-09-22 DIAGNOSIS — I471 Supraventricular tachycardia, unspecified: Secondary | ICD-10-CM

## 2023-09-22 NOTE — Patient Instructions (Addendum)
 Medication Instructions:  OK to Discontinue Zetia  10 mg   *If you need a refill on your cardiac medications before your next appointment, please call your pharmacy*  Lab Work: NONE ordered at this time of appointment   Testing/Procedures: NONE ordered at this time of appointment    Follow-Up: At [facility], you and your health needs are our priority.  As part of our continuing mission to provide you with exceptional heart care, our providers are all part of one team.  This team includes your primary Cardiologist (physician) and Advanced Practice Providers or APPs (Physician Assistants and Nurse Practitioners) who all work together to provide you with the care you need, when you need it.  Your next appointment:   1 year(s)  Provider:   Dr.Schumann    We recommend signing up for the patient portal called "MyChart".  Sign up information is provided on this After Visit Summary.  MyChart is used to connect with patients for Virtual Visits (Telemedicine).  Patients are able to view lab/test results, encounter notes, upcoming appointments, etc.  Non-urgent messages can be sent to your provider as well.   To learn more about what you can do with MyChart, go to [email or web address].

## 2023-09-22 NOTE — Progress Notes (Signed)
 Office Visit    Patient Name: Eric Hill Date of Encounter: 09/22/2023  Primary Care Provider:  Gabriel John, NP Primary Cardiologist:  Wendie Hamburg, MD  Chief Complaint    73 year old male with a history of palpitations, PACs, PVCs, hypertension, hyperlipidemia, and type 2 diabetes, who presents for follow-up related to palpitations and hyperlipidemia.  Past Medical History    Past Medical History:  Diagnosis Date   Acute non-recurrent sinusitis 05/22/2022   Allergy    seasonal   Cataract    no surgery yet   COVID-19 virus infection 06/15/2020   Depression    Diverticulitis    Erectile dysfunction    Foul smelling urine 11/19/2021   History of depression    History of IBS    has abd pain   Hyperlipidemia    Hypertension    Type 2 diabetes mellitus (HCC)    Past Surgical History:  Procedure Laterality Date   COLONOSCOPY     NO PAST SURGERIES      Allergies  Allergies  Allergen Reactions   Rosuvastatin      Very bad aching and muscles aches     Labs/Other Studies Reviewed    The following studies were reviewed today:  Cardiac Studies & Procedures   ______________________________________________________________________________________________     ECHOCARDIOGRAM  ECHOCARDIOGRAM COMPLETE 02/03/2018  Narrative *[facility]* [address] [place], [place] [address] [phone]  ------------------------------------------------------------------- Transthoracic Echocardiography  Patient:    Eric Hill MR #:       [id number] Study Date: 02/03/2018 Gender:     M Age:        72 Height:     165.1 cm Weight:     115.7 kg BSA:        2.36 m^2 Pt. Status: Room:  ATTENDING    Magnus Schuller, M.D. ORDERING     Magnus Schuller, M.D. REFERRING    Magnus Schuller, M.D. SONOGRAPHER  Sherel Dikes, RDCS PERFORMING   [facility]  cc:  ------------------------------------------------------------------- LV EF: 55% -    60%  ------------------------------------------------------------------- Indications:      Palpitation (R00.2).  ------------------------------------------------------------------- History:   Risk factors:  Palpitation. Former tobacco use. Hypertension. Diabetes mellitus. Morbidly obese.  ------------------------------------------------------------------- Study Conclusions  - Procedure narrative: Transthoracic echocardiography. Image quality was suboptimal. The study was technically difficult. Intravenous contrast (Definity ) was administered to opacify the LV. - Left ventricle: The cavity size was normal. Systolic function was normal. The estimated ejection fraction was in the range of 55% to 60%. Wall motion was normal; there were no regional wall motion abnormalities. The study is not technically sufficient to allow evaluation of LV diastolic function. - Aortic valve: Transvalvular velocity was within the normal range. There was no stenosis. There was trivial regurgitation. - Mitral valve: Transvalvular velocity was within the normal range. There was no evidence for stenosis. There was no regurgitation. - Right ventricle: The cavity size was normal. Wall thickness was normal. Systolic function was normal. - Atrial septum: No defect or patent foramen ovale was identified by color flow Doppler. - Tricuspid valve: There was trivial regurgitation. - Pulmonary arteries: Systolic pressure was within the normal range. PA peak pressure: 25 mm Hg (S).  ------------------------------------------------------------------- Study data:  No prior study was available for comparison.  Study status:  Routine.  Procedure:  The patient reported no pain pre or post test. Transthoracic echocardiography. Image quality was suboptimal. The study was technically difficult, as a result of poor acoustic windows, poor sound  wave transmission, and body habitus. Intravenous contrast (Definity ) was  administered to opacify the LV.  Study completion:  There were no complications. Transthoracic echocardiography.  M-mode, complete 2D, spectral Doppler, and color Doppler.  Birthdate:  Patient birthdate: 07-26-1950.  Age:  Patient is 73 yr old.  Sex:  Gender: male.    BMI: 42.4 kg/m^2.  Blood pressure:     134/68  Patient status:  Outpatient.  Study date:  Study date: 02/03/2018. Study time: 01:02 PM.  Location:  [facility]  -------------------------------------------------------------------  ------------------------------------------------------------------- Left ventricle:  The cavity size was normal. Systolic function was normal. The estimated ejection fraction was in the range of 55% to 60%. Wall motion was normal; there were no regional wall motion abnormalities. The study is not technically sufficient to allow evaluation of LV diastolic function.  ------------------------------------------------------------------- Aortic valve:   Trileaflet; normal thickness leaflets. Mobility was not restricted.  Doppler:  Transvalvular velocity was within the normal range. There was no stenosis. There was trivial regurgitation.  ------------------------------------------------------------------- Aorta:  Aortic root: The aortic root was normal in size.  ------------------------------------------------------------------- Mitral valve:   Structurally normal valve.   Mobility was not restricted.  Doppler:  Transvalvular velocity was within the normal range. There was no evidence for stenosis. There was no regurgitation.    Valve area by pressure half-time: 3.79 cm^2. Indexed valve area by pressure half-time: 1.6 cm^2/m^2.    Peak gradient (D): 6 mm Hg.  ------------------------------------------------------------------- Left atrium:  The atrium was normal in size.  ------------------------------------------------------------------- Atrial septum:  No defect or patent foramen ovale  was identified by color flow Doppler.  ------------------------------------------------------------------- Right ventricle:  The cavity size was normal. Wall thickness was normal. Systolic function was normal.  ------------------------------------------------------------------- Pulmonic valve:    Structurally normal valve.   Cusp separation was normal.  Doppler:  Transvalvular velocity was within the normal range. There was no evidence for stenosis. There was no regurgitation.  ------------------------------------------------------------------- Tricuspid valve:   Structurally normal valve.    Doppler: Transvalvular velocity was within the normal range. There was trivial regurgitation.  ------------------------------------------------------------------- Pulmonary artery:   The main pulmonary artery was normal-sized. Systolic pressure was within the normal range.  ------------------------------------------------------------------- Right atrium:  The atrium was normal in size.  ------------------------------------------------------------------- Pericardium:  There was no pericardial effusion.  ------------------------------------------------------------------- Systemic veins: Inferior vena cava: The vessel was normal in size. The respirophasic diameter changes were in the normal range (>= 50%), consistent with normal central venous pressure.  ------------------------------------------------------------------- Measurements  Left ventricle                           Value           Reference LV ID, ED, PLAX chordal                  50     mm       43 - 52 LV ID, ES, PLAX chordal                  35     mm       23 - 38 LV fx shortening, PLAX chordal           30     %        >=29 LV PW thickness, ED                      9  mm       --------- IVS/LV PW ratio, ED                      1.22            <=1.3 Stroke volume, 2D                        79     ml        --------- Stroke volume/bsa, 2D                    33     ml/m^2   --------- LV e&', lateral                           8.1    cm/s     --------- LV E/e&', lateral                         15.31           --------- LV e&', medial                            7.73   cm/s     --------- LV E/e&', medial                          16.04           --------- LV e&', average                           7.92   cm/s     --------- LV E/e&', average                         15.67           ---------  Ventricular septum                       Value           Reference IVS thickness, ED                        11     mm       ---------  LVOT                                     Value           Reference LVOT ID, S                               20     mm       --------- LVOT area                                3.14   cm^2     --------- LVOT peak velocity, S                    117    cm/s     --------- LVOT mean  velocity, S                    79.1   cm/s     --------- LVOT VTI, S                              25.3   cm       --------- LVOT peak gradient, S                    5      mm Hg    ---------  Aorta                                    Value           Reference Aortic root ID, ED                       35     mm       --------- Ascending aorta ID, A-P, S               31     mm       ---------  Left atrium                              Value           Reference LA ID, A-P, ES                           39     mm       --------- LA ID/bsa, A-P                           1.65   cm/m^2   <=2.2 LA volume, S                             46.4   ml       --------- LA volume/bsa, S                         19.6   ml/m^2   --------- LA volume, ES, 1-p A4C                   43.1   ml       --------- LA volume/bsa, ES, 1-p A4C               18.2   ml/m^2   --------- LA volume, ES, 1-p A2C                   46.9   ml       --------- LA volume/bsa, ES, 1-p A2C               19.8   ml/m^2   ---------  Mitral valve                              Value           Reference Mitral E-wave peak velocity  124    cm/s     --------- Mitral A-wave peak velocity              127    cm/s     --------- Mitral deceleration time                 197    ms       150 - 230 Mitral pressure half-time                58     ms       --------- Mitral peak gradient, D                  6      mm Hg    --------- Mitral E/A ratio, peak                   1               --------- Mitral valve area, PHT, DP               3.79   cm^2     --------- Mitral valve area/bsa, PHT, DP           1.6    cm^2/m^2 ---------  Pulmonary arteries                       Value           Reference PA pressure, S, DP                       25     mm Hg    <=30  Tricuspid valve                          Value           Reference Tricuspid regurg peak velocity           232.69 cm/s     --------- Tricuspid peak RV-RA gradient            22     mm Hg    --------- Tricuspid maximal regurg                 232.69 cm/s     --------- velocity, PISA  Systemic veins                           Value           Reference Estimated CVP                            3      mm Hg    ---------  Right ventricle                          Value           Reference TAPSE                                    20.3   mm       --------- RV pressure, S, DP  25     mm Hg    <=30 RV s&', lateral, S                        14     cm/s     ---------  Legend: (L)  and  (H)  mark values outside specified reference range.  ------------------------------------------------------------------- Prepared and Electronically Authenticated by  Maudine Sos, MD 2019-10-09T15:36:44    MONITORS  LONG TERM MONITOR (3-14 DAYS) 12/18/2021  Narrative Patch Wear Time:  19 days and 3 hours (2023-08-03T17:24:45-0400 to 2023-08-03T17:02:27-0400)  Monitor 1 Patient had a min HR of 48 bpm, max HR of 139 bpm, and avg HR of 75 bpm. Predominant underlying rhythm was Sinus  Rhythm. 2 Supraventricular Tachycardia runs occurred, the run with the fastest interval lasting 5 beats with a max rate of 139 bpm, the longest lasting 6 beats with an avg rate of 121 bpm. Isolated SVEs were rare (<1.0%), SVE Couplets were rare (<1.0%), and SVE Triplets were rare (<1.0%). Isolated VEs were frequent (13.5%, [id number]), VE Couplets were rare (<1.0%, 46), and VE Triplets were rare (<1.0%, 13). Ventricular Bigeminy and Trigeminy were present.  Monitor 2 Patient had a min HR of 52 bpm, max HR of 119 bpm, and avg HR of 73 bpm. Predominant underlying rhythm was Sinus Rhythm. 1 run of Supraventricular Tachycardia occurred lasting 4 beats with a max rate of 119 bpm (avg 111 bpm). Isolated SVEs were rare (<1.0%), SVE Couplets were rare (<1.0%), and no SVE Triplets were present. Isolated VEs were frequent (11.4%, [id number]), and no VE Couplets or VE Triplets were present. Ventricular Bigeminy and Trigeminy were present. Inverted QRS complexes possibly due to inverted placement of device.  Review of both monitors reveals predominant sinus rhythm with an average rate at 72 to 74 bpm.  Slowest heart rate was sinus bradycardia 52 bpm while sleeping at 3:02 AM on July 28.  SVT was noted with the fastest lasting 5 beats at max rate 139 and the longest lasting 6 beats at average rate 121 bpm.  There were rare isolated PACs, atrial couplets and triplets.  PVCs were rare and was evidence for transient ventricular bigeminy and trigeminy.  No prolonged pauses or episodes of atrial fibrillation were demonstrated.       ______________________________________________________________________________________________     Recent Labs: 11/20/2022: ALT 16; BUN 25; Creatinine, Ser 0.75; Potassium 4.5; Sodium 139  Recent Lipid Panel    Component Value Date/Time   CHOL 212 (H) 11/20/2022 1038   TRIG 210.0 (H) 11/20/2022 1038   HDL 45.60 11/20/2022 1038   CHOLHDL 5 11/20/2022 1038   VLDL 42.0 (H) 11/20/2022 1038    LDLCALC 56 11/19/2021 0958   LDLDIRECT 133.0 11/20/2022 1038    History of Present Illness    73 year old male with the above past medical history including palpitations, PACs, PVCs, hypertension, hyperlipidemia, and type 2 diabetes.  Echocardiogram in 2019 showed EF 55 to 60%, normal LV function, no RWMA, normal RV systolic function, no significant valvular abnormalities.  Cardiac monitor in 2019 revealed predominantly sinus rhythm, rare PACs and PVCs, no significant arrhythmia.  Repeat cardiac monitor in 11/2021 showed predominantly sinus rhythm, PSVT, PACs and PVCs.  He was last seen in the office on 06/23/2023 and was stable from a cardiac standpoint.  He denies symptoms concerning for angina.  Metoprolol  was increased to 25mg  in the morning and 12.5 mg in the evening.  He was also started on Zetia .  He  presents today for follow-up.  Since his last visit he has done well from a cardiac standpoint.  He denies any chest pain, palpitations, dizziness, dyspnea, edema, PND, orthopnea, weight gain.  He reports that he did not tolerate increase metoprolol  dosing.  He has since resumed metoprolol  12.5 mg twice daily.  He also did not tolerate Zetia .  He no longer wishes to continue this medication.  Otherwise, he reports feeling well.  Home Medications    Current Outpatient Medications  Medication Sig Dispense Refill   blood glucose meter kit and supplies KIT Dispense based on patient and insurance preference. Use up to 2-3 times daily as directed. Dx is E11.9 1 each 0   glipiZIDE  (GLUCOTROL  XL) 10 MG 24 hr tablet TAKE 1 TABLET BY MOUTH IN THE MORNING WITH FOOD FOR DIABETES 90 tablet 1   metoprolol  succinate (TOPROL  XL) 25 MG 24 hr tablet Take on tablet in the morning and 1/2 tablet in the evening (Patient taking differently: Take 12.5 mg by mouth 2 (two) times daily. Take 1/2 tablet in the morning and 1/2 tablet in the evening) 180 tablet 3   olmesartan  (BENICAR ) 20 MG tablet TAKE 1 TABLET BY MOUTH  EVERY DAY FOR BLOOD PRESSURE 90 tablet 3   sertraline  (ZOLOFT ) 100 MG tablet Take 1 tablet (100 mg total) by mouth daily. for anxiety and depression. 90 tablet 1   tamsulosin  (FLOMAX ) 0.4 MG CAPS capsule TAKE 2 CAPSULES BY MOUTH DAILY. FOR URINE FLOW 180 capsule 2   Vitamin D , Ergocalciferol , (DRISDOL ) 1.25 MG (50000 UNIT) CAPS capsule Take 1 capsule by mouth once weekly for vitamin D . (Patient not taking: Reported on 09/22/2023) 12 capsule 1   No current facility-administered medications for this visit.     Review of Systems    He denies chest pain, palpitations, dyspnea, pnd, orthopnea, n, v, dizziness, syncope, edema, weight gain, or early satiety. All other systems reviewed and are otherwise negative except as noted above.   Physical Exam    VS:  BP 120/64 (BP Location: Left Arm, Patient Position: Sitting, Cuff Size: Large)   Pulse 70   Ht 5\' 5"  (1.651 m)   Wt 248 lb (112.5 kg)   SpO2 97%   BMI 41.27 kg/m  STOP-Bang Score:         GEN: Well nourished, well developed, in no acute distress. HEENT: normal. Neck: Supple, no JVD, carotid bruits, or masses. Cardiac: RRR, no murmurs, rubs, or gallops. No clubbing, cyanosis, edema.  Radials/DP/PT 2+ and equal bilaterally.  Respiratory:  Respirations regular and unlabored, clear to auscultation bilaterally. GI: Soft, nontender, nondistended, BS + x 4. MS: no deformity or atrophy. Skin: warm and dry, no rash. Neuro:  Strength and sensation are intact. Psych: Normal affect.  Accessory Clinical Findings    ECG personally reviewed by me today -    - no EKG in office today.   Lab Results  Component Value Date   WBC 8.8 11/19/2021   HGB 14.8 11/19/2021   HCT 43.2 11/19/2021   MCV 88.1 11/19/2021   PLT 236.0 11/19/2021   Lab Results  Component Value Date   CREATININE 0.75 11/20/2022   BUN 25 (H) 11/20/2022   NA 139 11/20/2022   K 4.5 11/20/2022   CL 102 11/20/2022   CO2 29 11/20/2022   Lab Results  Component Value Date    ALT 16 11/20/2022   AST 19 11/20/2022   ALKPHOS 80 11/20/2022   BILITOT 0.6 11/20/2022   Lab Results  Component Value Date   CHOL 212 (H) 11/20/2022   HDL 45.60 11/20/2022   LDLCALC 56 11/19/2021   LDLDIRECT 133.0 11/20/2022   TRIG 210.0 (H) 11/20/2022   CHOLHDL 5 11/20/2022    Lab Results  Component Value Date   HGBA1C 6.7 (A) 05/26/2023    Assessment & Plan    1. Palpitations/PSVT/PACs/PVCs: Echocardiogram in 2019 showed EF 55 to 60%, normal LV function, no RWMA, normal RV systolic function, no significant valvular abnormalities.  Cardiac monitor in 11/2021 showed predominantly sinus rhythm, PSVT, PACs and PVCs.  He denies any recent palpitations.  He did not tolerate increase metoprolol  dosing due to fatigue.  He has since resumed metoprolol  succinate 12.5 mg twice daily and is tolerating this well.  Continue metoprolol .  2. Hypertension: BP well controlled. Continue current antihypertensive regimen.   3. Hyperlipidemia: LDL was 133 in 10/2022.  Statin intolerant.  Additionally, he did not tolerate Zetia .  He declines alternative lipid-lowering therapy at this time.  4. Type 2 diabetes: A1c was 6.9 in 10/2022.  Monitored and managed per PCP.  5. Disposition: Follow-up in 1 year. He will establish with Dr. Alda Amas (DOD today) following Dr. Arlana Bellini retirement.       Jude Norton, NP 09/22/2023, 11:49 AM

## 2023-11-06 ENCOUNTER — Other Ambulatory Visit: Payer: Self-pay | Admitting: Primary Care

## 2023-11-06 DIAGNOSIS — R3915 Urgency of urination: Secondary | ICD-10-CM

## 2023-11-06 DIAGNOSIS — I10 Essential (primary) hypertension: Secondary | ICD-10-CM

## 2023-11-24 ENCOUNTER — Ambulatory Visit (INDEPENDENT_AMBULATORY_CARE_PROVIDER_SITE_OTHER): Payer: Medicare Other | Admitting: Primary Care

## 2023-11-24 ENCOUNTER — Encounter: Payer: Self-pay | Admitting: Primary Care

## 2023-11-24 VITALS — BP 116/64 | HR 78 | Temp 97.2°F | Ht 65.0 in | Wt 251.0 lb

## 2023-11-24 DIAGNOSIS — R3915 Urgency of urination: Secondary | ICD-10-CM

## 2023-11-24 DIAGNOSIS — Z0000 Encounter for general adult medical examination without abnormal findings: Secondary | ICD-10-CM

## 2023-11-24 DIAGNOSIS — E785 Hyperlipidemia, unspecified: Secondary | ICD-10-CM

## 2023-11-24 DIAGNOSIS — R002 Palpitations: Secondary | ICD-10-CM | POA: Diagnosis not present

## 2023-11-24 DIAGNOSIS — E1165 Type 2 diabetes mellitus with hyperglycemia: Secondary | ICD-10-CM

## 2023-11-24 DIAGNOSIS — K219 Gastro-esophageal reflux disease without esophagitis: Secondary | ICD-10-CM | POA: Diagnosis not present

## 2023-11-24 DIAGNOSIS — E559 Vitamin D deficiency, unspecified: Secondary | ICD-10-CM | POA: Diagnosis not present

## 2023-11-24 DIAGNOSIS — R3912 Poor urinary stream: Secondary | ICD-10-CM | POA: Diagnosis not present

## 2023-11-24 DIAGNOSIS — Z125 Encounter for screening for malignant neoplasm of prostate: Secondary | ICD-10-CM

## 2023-11-24 DIAGNOSIS — I10 Essential (primary) hypertension: Secondary | ICD-10-CM | POA: Diagnosis not present

## 2023-11-24 DIAGNOSIS — F320 Major depressive disorder, single episode, mild: Secondary | ICD-10-CM

## 2023-11-24 LAB — MICROALBUMIN / CREATININE URINE RATIO
Creatinine,U: 129.5 mg/dL
Microalb Creat Ratio: UNDETERMINED mg/g (ref 0.0–30.0)
Microalb, Ur: <0.7 mg/dL

## 2023-11-24 LAB — COMPREHENSIVE METABOLIC PANEL WITH GFR
ALT: 12 U/L (ref 0–53)
AST: 14 U/L (ref 0–37)
Albumin: 4 g/dL (ref 3.5–5.2)
Alkaline Phosphatase: 68 U/L (ref 39–117)
BUN: 22 mg/dL (ref 6–23)
CO2: 26 meq/L (ref 19–32)
Calcium: 9 mg/dL (ref 8.4–10.5)
Chloride: 104 meq/L (ref 96–112)
Creatinine, Ser: 0.66 mg/dL (ref 0.40–1.50)
GFR: 94.92 mL/min (ref >60.00)
Glucose, Bld: 146 mg/dL — ABNORMAL HIGH (ref 70–99)
Potassium: 4.4 meq/L (ref 3.5–5.1)
Sodium: 140 meq/L (ref 135–145)
Total Bilirubin: 0.6 mg/dL (ref 0.2–1.2)
Total Protein: 6.6 g/dL (ref 6.0–8.3)

## 2023-11-24 LAB — LIPID PANEL
Cholesterol: 178 mg/dL (ref 0–200)
HDL: 41.9 mg/dL (ref >39.00)
LDL Cholesterol: 107 mg/dL — ABNORMAL HIGH (ref 0–99)
NonHDL: 135.96
Total CHOL/HDL Ratio: 4
Triglycerides: 145 mg/dL (ref 0.0–149.0)
VLDL: 29 mg/dL (ref 0.0–40.0)

## 2023-11-24 LAB — PSA, MEDICARE: PSA: 0.67 ng/mL (ref 0.10–4.00)

## 2023-11-24 LAB — VITAMIN D 25 HYDROXY (VIT D DEFICIENCY, FRACTURES): VITD: 17.17 ng/mL — ABNORMAL LOW (ref 30.00–100.00)

## 2023-11-24 LAB — HEMOGLOBIN A1C: Hgb A1c MFr Bld: 6.8 % — ABNORMAL HIGH (ref 4.6–6.5)

## 2023-11-24 NOTE — Progress Notes (Signed)
 Subjective:    Patient ID: Eric Hill, male    DOB: 1950-06-28, 73 y.o.   MRN: [id number]  HPI  Eric Hill is a very pleasant 73 y.o. male with history of hypertension, type 2 diabetes, hyperlipidemia, GERD who presents today for complete physical and follow up of chronic conditions.  Immunizations: -Tetanus: Completed in 2017  -Shingles: Never completed -Pneumonia: Completed Prevnar 13 in 2021, Pneumovax 23 in 2019  Diet: Fair diet.  Exercise: No regular exercise.  Eye exam: Completes annually  Dental exam: Completes semi-annually    Colonoscopy: Completed in 2020, due 2030   PSA: Due  BP Readings from Last 3 Encounters:  11/24/23 116/64  09/22/23 120/64  06/23/23 137/70      Review of Systems  Constitutional:  Negative for unexpected weight change.  HENT:  Negative for rhinorrhea.   Respiratory:  Negative for cough and shortness of breath.   Cardiovascular:  Negative for chest pain.  Gastrointestinal:  Negative for constipation and diarrhea.  Genitourinary:  Negative for difficulty urinating.  Musculoskeletal:  Negative for arthralgias and myalgias.  Skin:  Negative for rash.  Allergic/Immunologic: Negative for environmental allergies.  Neurological:  Negative for dizziness and headaches.         Past Medical History:  Diagnosis Date   Acute non-recurrent sinusitis 05/22/2022   Allergy    seasonal   Cataract    no surgery yet   COVID-19 virus infection 06/15/2020   Depression    Diverticulitis    Erectile dysfunction    Foul smelling urine 11/19/2021   History of depression    History of IBS    has abd pain   Hyperlipidemia    Hypertension    Type 2 diabetes mellitus (HCC)     Social History   Socioeconomic History   Marital status: Single    Spouse name: Not on file   Number of children: Not on file   Years of education: Not on file   Highest education level: Not on file  Occupational History   Not on file  Tobacco  Use   Smoking status: Former    Current packs/day: 0.00    Types: Cigarettes    Quit date: 11/18/1990    Years since quitting: 33.0   Smokeless tobacco: Never  Substance and Sexual Activity   Alcohol use: Not Currently    Comment: rarely   Drug use: No   Sexual activity: Not on file  Other Topics Concern   Not on file  Social History Narrative   Single   Works as a Hospital doctor.   No children   Lives in [place]   Enjoys racing cars, Photographer.   Social Drivers of Corporate investment banker Strain: Low Risk  (04/30/2023)   Overall Financial Resource Strain (CARDIA)    Difficulty of Paying Living Expenses: Not hard at all  Food Insecurity: No Food Insecurity (04/30/2023)   Hunger Vital Sign    Worried About Running Out of Food in the Last Year: Never true    Ran Out of Food in the Last Year: Never true  Transportation Needs: No Transportation Needs (04/30/2023)   PRAPARE - Administrator, Civil Service (Medical): No    Lack of Transportation (Non-Medical): No  Physical Activity: Inactive (04/30/2023)   Exercise Vital Sign    Days of Exercise per Week: 0 days    Minutes of Exercise per Session: 0 min  Stress: No Stress Concern Present (  04/30/2023)   [employer] [employer] - Occupational Stress Questionnaire    Feeling of Stress : Not at all  Social Connections: Socially Isolated (04/30/2023)   Social Connection and Isolation Panel    Frequency of Communication with Friends and Family: Three times a week    Frequency of Social Gatherings with Friends and Family: Once a week    Attends Religious Services: Never    Database administrator or Organizations: No    Attends Banker Meetings: Never    Marital Status: Never married  Intimate Partner Violence: Not At Risk (04/30/2023)   Humiliation, Afraid, Rape, and Kick questionnaire    Fear of Current or Ex-Partner: No    Emotionally Abused: No    Physically Abused: No    Sexually  Abused: No    Past Surgical History:  Procedure Laterality Date   COLONOSCOPY     NO PAST SURGERIES      Family History  Problem Relation Age of Onset   Neuropathy Mother    Cancer Sister        Back, brain   Cancer Maternal Grandfather        Abdominal    Stomach cancer Maternal Grandfather    Colon cancer Neg Hx    Esophageal cancer Neg Hx    Rectal cancer Neg Hx     Allergies  Allergen Reactions   Rosuvastatin      Very bad aching and muscles aches    Current Outpatient Medications on File Prior to Visit  Medication Sig Dispense Refill   blood glucose meter kit and supplies KIT Dispense based on patient and insurance preference. Use up to 2-3 times daily as directed. Dx is E11.9 1 each 0   glipiZIDE  (GLUCOTROL  XL) 10 MG 24 hr tablet TAKE 1 TABLET BY MOUTH IN THE MORNING WITH FOOD FOR DIABETES 90 tablet 1   metoprolol  succinate (TOPROL  XL) 25 MG 24 hr tablet Take on tablet in the morning and 1/2 tablet in the evening (Patient taking differently: Take 12.5 mg by mouth 2 (two) times daily. Take 1/2 tablet in the morning and 1/2 tablet in the evening) 180 tablet 3   olmesartan  (BENICAR ) 20 MG tablet TAKE 1 TABLET BY MOUTH EVERY DAY FOR BLOOD PRESSURE 90 tablet 0   sertraline  (ZOLOFT ) 100 MG tablet Take 1 tablet (100 mg total) by mouth daily. for anxiety and depression. 90 tablet 1   tamsulosin  (FLOMAX ) 0.4 MG CAPS capsule TAKE 2 CAPSULES BY MOUTH DAILY. FOR URINE FLOW 180 capsule 0   No current facility-administered medications on file prior to visit.    BP 116/64   Pulse 78   Temp (!) 97.2 F (36.2 C) (Temporal)   Ht 5' 5 (1.651 m)   Wt 251 lb (113.9 kg)   SpO2 97%   BMI 41.77 kg/m  Objective:   Physical Exam HENT:     Right Ear: Tympanic membrane and ear canal normal.     Left Ear: Tympanic membrane and ear canal normal.  Eyes:     Pupils: Pupils are equal, round, and reactive to light.  Cardiovascular:     Rate and Rhythm: Normal rate and regular rhythm.   Pulmonary:     Effort: Pulmonary effort is normal.     Breath sounds: Normal breath sounds.  Abdominal:     General: Bowel sounds are normal.     Palpations: Abdomen is soft.     Tenderness: There is no abdominal tenderness.  Musculoskeletal:        General: Normal range of motion.     Cervical back: Neck supple.  Skin:    General: Skin is warm and dry.  Neurological:     Mental Status: He is alert and oriented to person, place, and time.     Cranial Nerves: No cranial nerve deficit.     Deep Tendon Reflexes:     Reflex Scores:      Patellar reflexes are 2+ on the right side and 2+ on the left side. Psychiatric:        Mood and Affect: Mood normal.           Assessment & Plan:  Preventative health care Assessment & Plan: Discussed shingrix vaccines Colonoscopy UTD, due 2030 PSA due and pending.  Discussed the importance of a healthy diet and regular exercise in order for weight loss, and to reduce the risk of further co-morbidity.  Exam stable. Labs pending.  Follow up in 1 year for repeat physical.    Essential hypertension Assessment & Plan: Controlled.  Continue metoprolol  succinate 12.5 mg twice daily, olmesartan  20 mg daily. CMP pending.  Orders: -     Comprehensive metabolic panel with GFR  Type 2 diabetes mellitus with hyperglycemia, without long-term current use of insulin (HCC) Assessment & Plan: Repeat A1C pending.  Continue off medications. Urine microalbumin due and pending.  Follow up in 6 months.  Orders: -     Microalbumin / creatinine urine ratio -     Hemoglobin A1c  Gastroesophageal reflux disease, unspecified whether esophagitis present Assessment & Plan: Controlled.  Continue off treatment.   Hyperlipidemia, unspecified hyperlipidemia type Assessment & Plan: Statin and Zetia  intolerant.  Following with cardiology, office notes reviewed from May 2025. Repeat lipid panel pending.  Orders: -     Lipid panel  Current  mild episode of major depressive disorder without prior episode [facility]) Assessment & Plan: Controlled.  Continue sertraline  100 mg daily.   Palpitations Assessment & Plan: Controlled.  Following with cardiology, office notes reviewed from May 2025. Continue metoprolol  succinate 12.5 mg BID.   Urinary urgency Assessment & Plan: Controlled.  Continue tamsulosin  0.8 mg daily.   Weak urinary stream Assessment & Plan: Controlled.  Continue tamsulosin  0.8 mg daily.   Screening for prostate cancer -     PSA, Medicare  Vitamin D  deficiency -     VITAMIN D  25 Hydroxy (Vit-D Deficiency, Fractures)        Eric Matuszak K Bane Hagy, NP

## 2023-11-24 NOTE — Assessment & Plan Note (Signed)
Controlled.  Continue tamsulosin 0.8 mg daily.

## 2023-11-24 NOTE — Assessment & Plan Note (Signed)
 Controlled. Continue off treatment.

## 2023-11-24 NOTE — Assessment & Plan Note (Addendum)
 Controlled.  Continue metoprolol  succinate 12.5 mg twice daily, olmesartan  20 mg daily. CMP pending.

## 2023-11-24 NOTE — Assessment & Plan Note (Signed)
 Controlled.  Following with cardiology, office notes reviewed from May 2025. Continue metoprolol  succinate 12.5 mg BID.

## 2023-11-24 NOTE — Patient Instructions (Signed)
 Stop by the lab prior to leaving today. I will notify you of your results once received.   Please schedule a follow up visit for 6 months for a diabetes check.  It was a pleasure to see you today!

## 2023-11-24 NOTE — Assessment & Plan Note (Signed)
 Controlled.  Continue sertraline 100 mg daily.

## 2023-11-24 NOTE — Assessment & Plan Note (Signed)
 Statin and Zetia  intolerant.  Following with cardiology, office notes reviewed from May 2025. Repeat lipid panel pending.

## 2023-11-24 NOTE — Assessment & Plan Note (Signed)
 Discussed shingrix vaccines Colonoscopy UTD, due 2030 PSA due and pending.  Discussed the importance of a healthy diet and regular exercise in order for weight loss, and to reduce the risk of further co-morbidity.  Exam stable. Labs pending.  Follow up in 1 year for repeat physical.

## 2023-11-24 NOTE — Assessment & Plan Note (Signed)
 Repeat A1C pending.  Continue off medications. Urine microalbumin due and pending.  Follow up in 6 months.

## 2023-11-25 ENCOUNTER — Ambulatory Visit: Payer: Self-pay | Admitting: Primary Care

## 2023-11-25 NOTE — Telephone Encounter (Signed)
 Copied from CRM [id number]. Topic: Clinical - Lab/Test Results >> Nov 25, 2023 10:28 AM Eric Hill wrote: Reason for CRM: Lab test results relayed, Patient states his OTC Vitamin D  is 26mcg(1000lU)

## 2024-01-13 ENCOUNTER — Other Ambulatory Visit: Payer: Self-pay | Admitting: Primary Care

## 2024-01-13 DIAGNOSIS — E119 Type 2 diabetes mellitus without complications: Secondary | ICD-10-CM

## 2024-01-13 NOTE — Telephone Encounter (Unsigned)
 Copied from CRM [id number]. Topic: Clinical - Medication Question >> Jan 13, 2024  2:10 PM Thersia C wrote: Reason for CRM: Patient called in regarding prescription glipiZIDE  (GLUCOTROL  XL) 10 MG 24 hr tablet , did inform patient about the 3 business day timeframe

## 2024-01-14 ENCOUNTER — Telehealth: Payer: Self-pay | Admitting: Primary Care

## 2024-01-14 NOTE — Telephone Encounter (Addendum)
 Refill sent to pharmacy.  See refill request.  Please call patient and let him know the prescription was sent to his pharmacy.

## 2024-01-14 NOTE — Telephone Encounter (Signed)
 Copied from CRM [id number]. Topic: Clinical - Prescription Issue >> Jan 14, 2024 12:19 PM Rea C wrote: Reason for CRM:  glipiZIDE  (TAKE 1 TABLET BY MOUTH IN THE MORNING WITH FOOD FOR DIABETES)  Patient is calling to follow up on his prescription for glipizide . Refusal reason is stating that a DX code is needed. Patient would like to receive a call back on when and if his prescription will be filled, if possible.   [id number] - [place], [place] - [address] [place] [address] Phone: [phone]  Fax: [phone] >> Jan 14, 2024 12:23 PM Rea BROCKS wrote: Patient is concerned with not having the pills due to his blood sugar going up. He has been without his medication for two days. And would like to see if it can be filled soon. Patient asked if he can receive a text or call if the prescription will be filled or not, so he knows if he needs to go back to the pharmacy or not.

## 2024-01-14 NOTE — Telephone Encounter (Signed)
Unable to reach patient. Unable to leave voicemail.  

## 2024-01-15 NOTE — Telephone Encounter (Signed)
Unable to reach patient. Unable to leave voicemail.  

## 2024-01-18 NOTE — Telephone Encounter (Signed)
 Unable to reach patient. Unable to leave voicemail. 3rd attempt, will await patients call back.

## 2024-02-06 ENCOUNTER — Other Ambulatory Visit: Payer: Self-pay | Admitting: Primary Care

## 2024-02-06 DIAGNOSIS — R3915 Urgency of urination: Secondary | ICD-10-CM

## 2024-02-06 DIAGNOSIS — I10 Essential (primary) hypertension: Secondary | ICD-10-CM

## 2024-02-09 ENCOUNTER — Encounter: Payer: Self-pay | Admitting: Pharmacist

## 2024-02-09 NOTE — Progress Notes (Signed)
 Pharmacy Quality Measure Review  Statin Use in Persons with Diabetes (SUPD)  Patient notes prior hx intolerable side effects on rosuvastatin .  Not addressed in Mar 14, 2024, no statin codes billed.   Next PCP visit 2025-03-14. Future message sent to PCP for 03-14-2025.  ICD-10-CM code exceptions:  Rhabdomyolysis or myopathy G72.0            Drug-induced myopathy  G72.89          Other specified myopathies  G72.9            Myopathy, unspecified  M62.82         Rhabdomyolysis  T46.6X5A      Adverse effect of antihyperlipidemic and antiarteriosclerotic drugs, initial encounter    Future Appointments  Date Time Provider Department Center  05/02/2024 11:30 AM [facility] ANNUAL WELLNESS VISIT 1 [facility] [address]  05/26/2024 10:20 AM Gretta Comer POUR, NP [facility] [address]

## 2024-05-02 ENCOUNTER — Ambulatory Visit (INDEPENDENT_AMBULATORY_CARE_PROVIDER_SITE_OTHER): Payer: Medicare Other

## 2024-05-02 VITALS — BP 138/78 | HR 65 | Ht 65.0 in | Wt 252.2 lb

## 2024-05-02 DIAGNOSIS — Z23 Encounter for immunization: Secondary | ICD-10-CM | POA: Diagnosis not present

## 2024-05-02 DIAGNOSIS — Z0000 Encounter for general adult medical examination without abnormal findings: Secondary | ICD-10-CM | POA: Diagnosis not present

## 2024-05-02 NOTE — Progress Notes (Signed)
 "  Chief Complaint  Patient presents with   Medicare Wellness     Subjective:  Please attest and cosign this visit due to patients primary care provider not being in the office at the time the visit was completed.  (Pt of Comer Gaskins, NP)   Eric Hill is a 74 y.o. male who presents for a Medicare Annual Wellness Visit.  Visit info / Clinical Intake: Medicare Wellness Visit Type:: Subsequent Annual Wellness Visit Persons participating in visit and providing information:: patient Medicare Wellness Visit Mode:: In-person (required for WTM) Interpreter Needed?: No Pre-visit prep was completed: yes AWV questionnaire completed by patient prior to visit?: no Living arrangements:: (!) lives alone Patient's Overall Health Status Rating: good Typical amount of pain: none Does pain affect daily life?: no Are you currently prescribed opioids?: no  Dietary Habits and Nutritional Risks How many meals a day?: 2 Eats fruit and vegetables daily?: yes Most meals are obtained by: preparing own meals; eating out In the last 2 weeks, have you had any of the following?: none Diabetic:: (!) yes Any non-healing wounds?: no How often do you check your BS?: as needed Would you like to be referred to a Nutritionist or for Diabetic Management? : no  Functional Status Activities of Daily Living (to include ambulation/medication): Independent Ambulation: Independent with device- listed below Medication Administration: Independent Home Management (perform basic housework or laundry): Independent Manage your own finances?: yes Primary transportation is: driving Concerns about vision?: no *vision screening is required for WTM* Concerns about hearing?: no  Fall Screening Falls in the past year?: 0 Number of falls in past year: 0 Was there an injury with Fall?: 0 Fall Risk Category Calculator: 0 Patient Fall Risk Level: Low Fall Risk  Fall Risk Patient at Risk for Falls Due to: No Fall  Risks Fall risk Follow up: Falls evaluation completed; Falls prevention discussed  Home and Transportation Safety: All rugs have non-skid backing?: N/A, no rugs All stairs or steps have railings?: yes (outside) Grab bars in the bathtub or shower?: yes Have non-skid surface in bathtub or shower?: yes Good home lighting?: yes Regular seat belt use?: yes Hospital stays in the last year:: no  Cognitive Assessment Difficulty concentrating, remembering, or making decisions? : no Will 6CIT or Mini Cog be Completed: yes What year is it?: 0 points What month is it?: 0 points Give patient an address phrase to remember (5 components): [address] [place] About what time is it?: 0 points Count backwards from 20 to 1: 0 points Say the months of the year in reverse: 0 points Repeat the address phrase from earlier: 0 points 6 CIT Score: 0 points  Advance Directives (For Healthcare) Does Patient Have a Medical Advance Directive?: No Would patient like information on creating a medical advance directive?: Yes (MAU/Ambulatory/Procedural Areas - Information given)  Reviewed/Updated  Reviewed/Updated: Reviewed All (Medical, Surgical, Family, Medications, Allergies, Care Teams, Patient Goals)    Allergies (verified) Rosuvastatin    Current Medications (verified) Outpatient Encounter Medications as of 05/02/2024  Medication Sig   blood glucose meter kit and supplies KIT Dispense based on patient and insurance preference. Use up to 2-3 times daily as directed. Dx is E11.9   glipiZIDE  (GLUCOTROL  XL) 10 MG 24 hr tablet TAKE 1 TABLET BY MOUTH IN THE MORNING WITH FOOD FOR DIABETES   metoprolol  succinate (TOPROL  XL) 25 MG 24 hr tablet Take on tablet in the morning and 1/2 tablet in the evening   olmesartan  (BENICAR ) 20  MG tablet TAKE 1 TABLET BY MOUTH EVERY DAY FOR BLOOD PRESSURE   sertraline  (ZOLOFT ) 100 MG tablet Take 1 tablet (100 mg total) by mouth daily. for anxiety and depression.    tamsulosin  (FLOMAX ) 0.4 MG CAPS capsule TAKE 2 CAPSULES BY MOUTH DAILY. FOR URINE FLOW   No facility-administered encounter medications on file as of 05/02/2024.    History: Past Medical History:  Diagnosis Date   Acute non-recurrent sinusitis 05/22/2022   Allergy    seasonal   Cataract    no surgery yet   COVID-19 virus infection 06/15/2020   Depression    Diverticulitis    Erectile dysfunction    Foul smelling urine 11/19/2021   History of depression    History of IBS    has abd pain   Hyperlipidemia    Hypertension    Type 2 diabetes mellitus (HCC)    Past Surgical History:  Procedure Laterality Date   COLONOSCOPY     NO PAST SURGERIES     Family History  Problem Relation Age of Onset   Neuropathy Mother    Cancer Sister        Back, brain   Cancer Maternal Grandfather        Abdominal    Stomach cancer Maternal Grandfather    Colon cancer Neg Hx    Esophageal cancer Neg Hx    Rectal cancer Neg Hx    Social History   Occupational History   Not on file  Tobacco Use   Smoking status: Former    Current packs/day: 0.00    Types: Cigarettes    Quit date: 11/18/1990    Years since quitting: 33.4   Smokeless tobacco: Never  Substance and Sexual Activity   Alcohol use: Not Currently    Comment: rarely   Drug use: No   Sexual activity: Yes   Tobacco Counseling Counseling given: Yes  SDOH Screenings   Food Insecurity: No Food Insecurity (05/02/2024)  Housing: Unknown (05/02/2024)  Transportation Needs: No Transportation Needs (05/02/2024)  Utilities: Not At Risk (05/02/2024)  Alcohol Screen: Low Risk (04/30/2023)  Depression (PHQ2-9): Low Risk (05/02/2024)  Financial Resource Strain: Low Risk (04/30/2023)  Physical Activity: Inactive (05/02/2024)  Social Connections: Socially Isolated (05/02/2024)  Stress: No Stress Concern Present (05/02/2024)  Tobacco Use: Medium Risk (05/02/2024)  Health Literacy: Adequate Health Literacy (05/02/2024)   See flowsheets for full screening  details  Depression Screen PHQ 2 & 9 Depression Scale- Over the past 2 weeks, how often have you been bothered by any of the following problems? Little interest or pleasure in doing things: 0 Feeling down, depressed, or hopeless (PHQ Adolescent also includes...irritable): 0 PHQ-2 Total Score: 0 Trouble falling or staying asleep, or sleeping too much: 0 (gets about 6hrs of sleep per day) Feeling tired or having little energy: 0 Poor appetite or overeating (PHQ Adolescent also includes...weight loss): 0 Feeling bad about yourself - or that you are a failure or have let yourself or your family down: 0 Trouble concentrating on things, such as reading the newspaper or watching television (PHQ Adolescent also includes...like school work): 0 Moving or speaking so slowly that other people could have noticed. Or the opposite - being so fidgety or restless that you have been moving around a lot more than usual: 0 Thoughts that you would be better off dead, or of hurting yourself in some way: 0 PHQ-9 Total Score: 0 If you checked off any problems, how difficult have these problems made it for you  to do your work, take care of things at home, or get along with other people?: Not difficult at all  Depression Treatment Depression Interventions/Treatment : Medication; Currently on Treatment; PHQ2-9 Score <4 Follow-up Not Indicated     Goals Addressed               This Visit's Progress     Patient Stated (pt-stated)        Patient stated he plans to continue managing diet/sugar intake and bp readings             Objective:    Today's Vitals   05/02/24 1114  BP: 138/78  Pulse: 65  SpO2: 95%  Weight: 252 lb 3.2 oz (114.4 kg)  Height: 5' 5 (1.651 m)   Body mass index is 41.97 kg/m.  Hearing/Vision screen Hearing Screening - Comments:: Denies hearing difficulties   Vision Screening - Comments:: Wears rx glasses - up to date with routine eye exams with [facility] Immunizations  and Health Maintenance Health Maintenance  Topic Date Due   FOOT EXAM  11/20/2022   OPHTHALMOLOGY EXAM  12/16/2023   Zoster Vaccines- Shingrix (1 of 2) 07/31/2024 (Originally 03/21/1970)   HEMOGLOBIN A1C  05/26/2024   Diabetic kidney evaluation - eGFR measurement  11/23/2024   Diabetic kidney evaluation - Urine ACR  11/23/2024   Medicare Annual Wellness (AWV)  05/02/2025   DTaP/Tdap/Td (2 - Tdap) 12/24/2025   Colonoscopy  12/01/2028   Pneumococcal Vaccine: 50+ Years  Completed   Influenza Vaccine  Completed   Hepatitis C Screening  Completed   Meningococcal B Vaccine  Aged Out   COVID-19 Vaccine  Discontinued        Assessment/Plan:  This is a routine wellness examination for Issa.  Patient Care Team: Gretta Comer POUR, NP as PCP - General (Nurse Practitioner) Kate Lonni CROME, MD as PCP - Cardiology (Cardiology) Octavia Lonni, MD as Consulting Physician (Ophthalmology)  I have personally reviewed and noted the following in the patients chart:   Medical and social history Use of alcohol, tobacco or illicit drugs  Current medications and supplements including opioid prescriptions. Functional ability and status Nutritional status Physical activity Advanced directives List of other physicians Hospitalizations, surgeries, and ER visits in previous 12 months Vitals Screenings to include cognitive, depression, and falls Referrals and appointments  Orders Placed This Encounter  Procedures   Flu vaccine HIGH DOSE PF(Fluzone Trivalent)   In addition, I have reviewed and discussed with patient certain preventive protocols, quality metrics, and best practice recommendations. A written personalized care plan for preventive services as well as general preventive health recommendations were provided to patient.   Verdie CHRISTELLA Saba, CMA   05/02/2024   Return in 1 year (on 05/02/2025).  After Visit Summary: (In Person-Declined) Patient declined AVS at this  time.  Nurse Notes: scheduled CPE appt w/PCP for 10/2024 ; scheduled 2027 AWV appt "

## 2024-05-02 NOTE — Patient Instructions (Addendum)
 Eric Hill,  Thank you for taking the time for your Medicare Wellness Visit. I appreciate your continued commitment to your health goals. Please review the care plan we discussed, and feel free to reach out if I can assist you further.  Please note that Annual Wellness Visits do not include a physical exam. Some assessments may be limited, especially if the visit was conducted virtually. If needed, we may recommend an in-person follow-up with your provider.  Ongoing Care Seeing your primary care provider every 3 to 6 months helps us  monitor your health and provide consistent, personalized care.   Referrals If a referral was made during today's visit and you haven't received any updates within two weeks, please contact the referred provider directly to check on the status.  Recommended Screenings:  Health Maintenance  Topic Date Due   Zoster (Shingles) Vaccine (1 of 2) Never done   Complete foot exam   11/20/2022   Flu Shot  11/27/2023   Eye exam for diabetics  12/16/2023   Hemoglobin A1C  05/26/2024   Yearly kidney function blood test for diabetes  11/23/2024   Yearly kidney health urinalysis for diabetes  11/23/2024   Medicare Annual Wellness Visit  05/02/2025   DTaP/Tdap/Td vaccine (2 - Tdap) 12/24/2025   Colon Cancer Screening  12/01/2028   Pneumococcal Vaccine for age over 83  Completed   Hepatitis C Screening  Completed   Meningitis B Vaccine  Aged Out   COVID-19 Vaccine  Discontinued       05/02/2024   11:15 AM  Advanced Directives  Does Patient Have a Medical Advance Directive? No  Would patient like information on creating a medical advance directive? Yes (MAU/Ambulatory/Procedural Areas - Information given)    Vision: Annual vision screenings are recommended for early detection of glaucoma, cataracts, and diabetic retinopathy. These exams can also reveal signs of chronic conditions such as diabetes and high blood pressure.  Dental: Annual dental screenings help  detect early signs of oral cancer, gum disease, and other conditions linked to overall health, including heart disease and diabetes.

## 2024-05-05 ENCOUNTER — Ambulatory Visit: Admitting: Family Medicine

## 2024-05-14 ENCOUNTER — Other Ambulatory Visit: Payer: Self-pay | Admitting: Primary Care

## 2024-05-14 DIAGNOSIS — J019 Acute sinusitis, unspecified: Secondary | ICD-10-CM

## 2024-05-26 ENCOUNTER — Ambulatory Visit: Admitting: Primary Care

## 2024-05-29 ENCOUNTER — Other Ambulatory Visit: Payer: Self-pay | Admitting: Primary Care

## 2024-05-29 DIAGNOSIS — F320 Major depressive disorder, single episode, mild: Secondary | ICD-10-CM

## 2024-06-07 ENCOUNTER — Ambulatory Visit: Admitting: Primary Care

## 2024-11-25 ENCOUNTER — Encounter: Admitting: Primary Care

## 2025-05-03 ENCOUNTER — Ambulatory Visit
